# Patient Record
Sex: Male | Born: 1940
Health system: Southern US, Community
[De-identification: ages and names within clinical notes are randomized; demographics above are authoritative.]

## PROBLEM LIST (undated history)

## (undated) DIAGNOSIS — Z8673 Personal history of transient ischemic attack (TIA), and cerebral infarction without residual deficits: Secondary | ICD-10-CM

## (undated) DIAGNOSIS — I672 Cerebral atherosclerosis: Secondary | ICD-10-CM

## (undated) DIAGNOSIS — I519 Heart disease, unspecified: Secondary | ICD-10-CM

## (undated) DIAGNOSIS — I639 Cerebral infarction, unspecified: Secondary | ICD-10-CM

## (undated) DIAGNOSIS — I5020 Unspecified systolic (congestive) heart failure: Secondary | ICD-10-CM

## (undated) DIAGNOSIS — C801 Malignant (primary) neoplasm, unspecified: Secondary | ICD-10-CM

## (undated) DIAGNOSIS — N183 Chronic kidney disease, stage 3 unspecified: Secondary | ICD-10-CM

## (undated) DIAGNOSIS — I6529 Occlusion and stenosis of unspecified carotid artery: Secondary | ICD-10-CM

## (undated) HISTORY — DX: Personal history of transient ischemic attack (TIA), and cerebral infarction without residual deficits: Z86.73

## (undated) HISTORY — PX: BACK SURGERY: SHX140

## (undated) HISTORY — PX: REPLACEMENT TOTAL KNEE: SUR1224

## (undated) HISTORY — DX: Occlusion and stenosis of unspecified carotid artery: I65.29

## (undated) HISTORY — DX: Personal history of transient ischemic attack (TIA), and cerebral infarction without residual deficits: I67.2

## (undated) HISTORY — DX: Unspecified systolic (congestive) heart failure: I50.20

## (undated) HISTORY — DX: Heart disease, unspecified: I51.9

## (undated) HISTORY — DX: Chronic kidney disease, stage 3 unspecified: N18.30

## (undated) HISTORY — PX: SHOULDER SURGERY: SHX246

---

## 2019-02-28 DIAGNOSIS — G8929 Other chronic pain: Secondary | ICD-10-CM

## 2019-02-28 DIAGNOSIS — M961 Postlaminectomy syndrome, not elsewhere classified: Secondary | ICD-10-CM

## 2019-02-28 DIAGNOSIS — Z9889 Other specified postprocedural states: Secondary | ICD-10-CM | POA: Insufficient documentation

## 2019-02-28 DIAGNOSIS — M5136 Other intervertebral disc degeneration, lumbar region: Secondary | ICD-10-CM

## 2019-02-28 DIAGNOSIS — M5116 Intervertebral disc disorders with radiculopathy, lumbar region: Secondary | ICD-10-CM | POA: Diagnosis not present

## 2019-02-28 DIAGNOSIS — Z9682 Presence of neurostimulator: Secondary | ICD-10-CM | POA: Diagnosis not present

## 2019-02-28 DIAGNOSIS — Z9689 Presence of other specified functional implants: Secondary | ICD-10-CM

## 2019-02-28 DIAGNOSIS — Z79891 Long term (current) use of opiate analgesic: Secondary | ICD-10-CM | POA: Diagnosis not present

## 2019-02-28 DIAGNOSIS — M5416 Radiculopathy, lumbar region: Secondary | ICD-10-CM

## 2019-02-28 DIAGNOSIS — M51369 Other intervertebral disc degeneration, lumbar region without mention of lumbar back pain or lower extremity pain: Secondary | ICD-10-CM | POA: Insufficient documentation

## 2019-02-28 DIAGNOSIS — M542 Cervicalgia: Secondary | ICD-10-CM

## 2019-02-28 HISTORY — DX: Radiculopathy, lumbar region: M54.16

## 2019-02-28 HISTORY — DX: Other specified postprocedural states: Z98.890

## 2019-02-28 HISTORY — DX: Other chronic pain: G89.29

## 2019-02-28 HISTORY — DX: Cervicalgia: M54.2

## 2019-02-28 HISTORY — DX: Presence of other specified functional implants: Z96.89

## 2019-02-28 HISTORY — DX: Postlaminectomy syndrome, not elsewhere classified: M96.1

## 2019-02-28 HISTORY — DX: Other intervertebral disc degeneration, lumbar region: M51.36

## 2019-02-28 HISTORY — DX: Other intervertebral disc degeneration, lumbar region without mention of lumbar back pain or lower extremity pain: M51.369

## 2019-03-04 DIAGNOSIS — Z0000 Encounter for general adult medical examination without abnormal findings: Secondary | ICD-10-CM | POA: Diagnosis not present

## 2019-03-04 DIAGNOSIS — Z6833 Body mass index (BMI) 33.0-33.9, adult: Secondary | ICD-10-CM | POA: Diagnosis not present

## 2019-03-04 DIAGNOSIS — E1165 Type 2 diabetes mellitus with hyperglycemia: Secondary | ICD-10-CM | POA: Diagnosis not present

## 2019-03-04 DIAGNOSIS — Z7189 Other specified counseling: Secondary | ICD-10-CM | POA: Diagnosis not present

## 2019-03-04 DIAGNOSIS — Z713 Dietary counseling and surveillance: Secondary | ICD-10-CM | POA: Diagnosis not present

## 2019-03-04 DIAGNOSIS — E1129 Type 2 diabetes mellitus with other diabetic kidney complication: Secondary | ICD-10-CM | POA: Diagnosis not present

## 2019-03-04 DIAGNOSIS — Z1331 Encounter for screening for depression: Secondary | ICD-10-CM | POA: Diagnosis not present

## 2019-03-04 DIAGNOSIS — Z1389 Encounter for screening for other disorder: Secondary | ICD-10-CM | POA: Diagnosis not present

## 2019-03-04 DIAGNOSIS — E785 Hyperlipidemia, unspecified: Secondary | ICD-10-CM | POA: Diagnosis not present

## 2019-03-04 NOTE — Patient Outreach (Signed)
Received MD referral from Dr. Nyra Capes office.  I have sent it to [email or web address] HTA's Care Management per workflow.

## 2019-03-06 NOTE — Patient Outreach (Signed)
Received a referral from Dr. Sheral Apley office, Patient has HTA insurance sent the referral to [facility] through [email or web address] email.

## 2019-03-20 DIAGNOSIS — E1129 Type 2 diabetes mellitus with other diabetic kidney complication: Secondary | ICD-10-CM | POA: Diagnosis not present

## 2019-03-20 DIAGNOSIS — E669 Obesity, unspecified: Secondary | ICD-10-CM | POA: Diagnosis not present

## 2019-03-20 DIAGNOSIS — E1165 Type 2 diabetes mellitus with hyperglycemia: Secondary | ICD-10-CM | POA: Diagnosis not present

## 2019-03-20 DIAGNOSIS — E785 Hyperlipidemia, unspecified: Secondary | ICD-10-CM | POA: Diagnosis not present

## 2019-03-28 DIAGNOSIS — M5116 Intervertebral disc disorders with radiculopathy, lumbar region: Secondary | ICD-10-CM | POA: Diagnosis not present

## 2019-03-28 DIAGNOSIS — M5136 Other intervertebral disc degeneration, lumbar region: Secondary | ICD-10-CM | POA: Diagnosis not present

## 2019-03-28 DIAGNOSIS — Z9889 Other specified postprocedural states: Secondary | ICD-10-CM | POA: Diagnosis not present

## 2019-03-28 DIAGNOSIS — Z760 Encounter for issue of repeat prescription: Secondary | ICD-10-CM | POA: Diagnosis not present

## 2019-03-28 DIAGNOSIS — Z79891 Long term (current) use of opiate analgesic: Secondary | ICD-10-CM | POA: Diagnosis not present

## 2019-03-28 DIAGNOSIS — M542 Cervicalgia: Secondary | ICD-10-CM | POA: Diagnosis not present

## 2019-03-28 DIAGNOSIS — Z9682 Presence of neurostimulator: Secondary | ICD-10-CM | POA: Diagnosis not present

## 2019-03-28 DIAGNOSIS — G8929 Other chronic pain: Secondary | ICD-10-CM | POA: Diagnosis not present

## 2019-04-03 DIAGNOSIS — E1165 Type 2 diabetes mellitus with hyperglycemia: Secondary | ICD-10-CM | POA: Diagnosis not present

## 2019-04-03 DIAGNOSIS — E1129 Type 2 diabetes mellitus with other diabetic kidney complication: Secondary | ICD-10-CM | POA: Diagnosis not present

## 2019-04-03 DIAGNOSIS — Z6831 Body mass index (BMI) 31.0-31.9, adult: Secondary | ICD-10-CM | POA: Diagnosis not present

## 2019-04-19 DIAGNOSIS — E1129 Type 2 diabetes mellitus with other diabetic kidney complication: Secondary | ICD-10-CM | POA: Diagnosis not present

## 2019-04-19 DIAGNOSIS — E1165 Type 2 diabetes mellitus with hyperglycemia: Secondary | ICD-10-CM | POA: Diagnosis not present

## 2019-04-19 DIAGNOSIS — E785 Hyperlipidemia, unspecified: Secondary | ICD-10-CM | POA: Diagnosis not present

## 2019-04-25 NOTE — Patient Outreach (Signed)
Received a referral from Dr.  Nyra Capes Patient has HTA insurance, This referral has been transferred to HTA CM through email address [email or web address] per workflow.

## 2019-05-14 DIAGNOSIS — E1129 Type 2 diabetes mellitus with other diabetic kidney complication: Secondary | ICD-10-CM | POA: Diagnosis not present

## 2019-05-14 DIAGNOSIS — E1165 Type 2 diabetes mellitus with hyperglycemia: Secondary | ICD-10-CM | POA: Diagnosis not present

## 2019-05-20 DIAGNOSIS — E1165 Type 2 diabetes mellitus with hyperglycemia: Secondary | ICD-10-CM | POA: Diagnosis not present

## 2019-05-20 DIAGNOSIS — E785 Hyperlipidemia, unspecified: Secondary | ICD-10-CM | POA: Diagnosis not present

## 2019-05-20 DIAGNOSIS — E1129 Type 2 diabetes mellitus with other diabetic kidney complication: Secondary | ICD-10-CM | POA: Diagnosis not present

## 2019-05-28 DIAGNOSIS — E785 Hyperlipidemia, unspecified: Secondary | ICD-10-CM | POA: Diagnosis not present

## 2019-05-28 DIAGNOSIS — E1129 Type 2 diabetes mellitus with other diabetic kidney complication: Secondary | ICD-10-CM | POA: Diagnosis not present

## 2019-06-03 DIAGNOSIS — E1129 Type 2 diabetes mellitus with other diabetic kidney complication: Secondary | ICD-10-CM | POA: Diagnosis not present

## 2019-06-03 DIAGNOSIS — E785 Hyperlipidemia, unspecified: Secondary | ICD-10-CM | POA: Diagnosis not present

## 2019-06-03 DIAGNOSIS — Z6829 Body mass index (BMI) 29.0-29.9, adult: Secondary | ICD-10-CM | POA: Diagnosis not present

## 2019-06-03 DIAGNOSIS — E1165 Type 2 diabetes mellitus with hyperglycemia: Secondary | ICD-10-CM | POA: Diagnosis not present

## 2019-06-19 DIAGNOSIS — E1129 Type 2 diabetes mellitus with other diabetic kidney complication: Secondary | ICD-10-CM | POA: Diagnosis not present

## 2019-06-19 DIAGNOSIS — E785 Hyperlipidemia, unspecified: Secondary | ICD-10-CM | POA: Diagnosis not present

## 2019-06-19 DIAGNOSIS — E1165 Type 2 diabetes mellitus with hyperglycemia: Secondary | ICD-10-CM | POA: Diagnosis not present

## 2019-06-19 DIAGNOSIS — Z6829 Body mass index (BMI) 29.0-29.9, adult: Secondary | ICD-10-CM | POA: Diagnosis not present

## 2019-06-27 DIAGNOSIS — Z79891 Long term (current) use of opiate analgesic: Secondary | ICD-10-CM | POA: Diagnosis not present

## 2019-06-27 DIAGNOSIS — M542 Cervicalgia: Secondary | ICD-10-CM | POA: Diagnosis not present

## 2019-06-27 DIAGNOSIS — Z760 Encounter for issue of repeat prescription: Secondary | ICD-10-CM | POA: Diagnosis not present

## 2019-06-27 DIAGNOSIS — G8928 Other chronic postprocedural pain: Secondary | ICD-10-CM | POA: Diagnosis not present

## 2019-06-27 DIAGNOSIS — Z0289 Encounter for other administrative examinations: Secondary | ICD-10-CM

## 2019-06-27 DIAGNOSIS — M5116 Intervertebral disc disorders with radiculopathy, lumbar region: Secondary | ICD-10-CM | POA: Diagnosis not present

## 2019-06-27 DIAGNOSIS — Z9889 Other specified postprocedural states: Secondary | ICD-10-CM | POA: Diagnosis not present

## 2019-06-27 DIAGNOSIS — Z9682 Presence of neurostimulator: Secondary | ICD-10-CM | POA: Diagnosis not present

## 2019-06-27 HISTORY — DX: Encounter for other administrative examinations: Z02.89

## 2019-07-12 DIAGNOSIS — E119 Type 2 diabetes mellitus without complications: Secondary | ICD-10-CM | POA: Diagnosis not present

## 2019-07-19 DIAGNOSIS — E785 Hyperlipidemia, unspecified: Secondary | ICD-10-CM | POA: Diagnosis not present

## 2019-07-19 DIAGNOSIS — E1165 Type 2 diabetes mellitus with hyperglycemia: Secondary | ICD-10-CM | POA: Diagnosis not present

## 2019-07-19 DIAGNOSIS — E1129 Type 2 diabetes mellitus with other diabetic kidney complication: Secondary | ICD-10-CM | POA: Diagnosis not present

## 2019-09-02 DIAGNOSIS — E1129 Type 2 diabetes mellitus with other diabetic kidney complication: Secondary | ICD-10-CM | POA: Diagnosis not present

## 2019-09-02 DIAGNOSIS — E785 Hyperlipidemia, unspecified: Secondary | ICD-10-CM | POA: Diagnosis not present

## 2019-09-09 DIAGNOSIS — E1129 Type 2 diabetes mellitus with other diabetic kidney complication: Secondary | ICD-10-CM | POA: Diagnosis not present

## 2019-09-09 DIAGNOSIS — E785 Hyperlipidemia, unspecified: Secondary | ICD-10-CM | POA: Diagnosis not present

## 2019-09-09 DIAGNOSIS — N1831 Chronic kidney disease, stage 3a: Secondary | ICD-10-CM | POA: Diagnosis not present

## 2019-09-09 DIAGNOSIS — E1165 Type 2 diabetes mellitus with hyperglycemia: Secondary | ICD-10-CM | POA: Diagnosis not present

## 2019-09-11 DIAGNOSIS — E1129 Type 2 diabetes mellitus with other diabetic kidney complication: Secondary | ICD-10-CM | POA: Diagnosis not present

## 2019-09-18 DIAGNOSIS — E1165 Type 2 diabetes mellitus with hyperglycemia: Secondary | ICD-10-CM | POA: Diagnosis not present

## 2019-09-18 DIAGNOSIS — E1129 Type 2 diabetes mellitus with other diabetic kidney complication: Secondary | ICD-10-CM | POA: Diagnosis not present

## 2019-09-18 DIAGNOSIS — E785 Hyperlipidemia, unspecified: Secondary | ICD-10-CM | POA: Diagnosis not present

## 2019-09-18 DIAGNOSIS — N1831 Chronic kidney disease, stage 3a: Secondary | ICD-10-CM | POA: Diagnosis not present

## 2019-09-19 DIAGNOSIS — M5416 Radiculopathy, lumbar region: Secondary | ICD-10-CM | POA: Diagnosis not present

## 2019-09-19 DIAGNOSIS — Z9689 Presence of other specified functional implants: Secondary | ICD-10-CM | POA: Diagnosis not present

## 2019-09-19 DIAGNOSIS — Z0289 Encounter for other administrative examinations: Secondary | ICD-10-CM | POA: Diagnosis not present

## 2019-09-19 DIAGNOSIS — M5136 Other intervertebral disc degeneration, lumbar region: Secondary | ICD-10-CM | POA: Diagnosis not present

## 2019-09-19 DIAGNOSIS — Z9889 Other specified postprocedural states: Secondary | ICD-10-CM | POA: Diagnosis not present

## 2019-09-19 DIAGNOSIS — M542 Cervicalgia: Secondary | ICD-10-CM | POA: Diagnosis not present

## 2019-09-25 DIAGNOSIS — N1831 Chronic kidney disease, stage 3a: Secondary | ICD-10-CM | POA: Diagnosis not present

## 2019-10-09 DIAGNOSIS — Z6828 Body mass index (BMI) 28.0-28.9, adult: Secondary | ICD-10-CM | POA: Diagnosis not present

## 2019-10-09 DIAGNOSIS — N1831 Chronic kidney disease, stage 3a: Secondary | ICD-10-CM | POA: Diagnosis not present

## 2019-10-09 DIAGNOSIS — E1129 Type 2 diabetes mellitus with other diabetic kidney complication: Secondary | ICD-10-CM | POA: Diagnosis not present

## 2019-10-09 DIAGNOSIS — E1165 Type 2 diabetes mellitus with hyperglycemia: Secondary | ICD-10-CM | POA: Diagnosis not present

## 2019-10-16 DIAGNOSIS — M25562 Pain in left knee: Secondary | ICD-10-CM | POA: Diagnosis not present

## 2019-10-16 DIAGNOSIS — M1712 Unilateral primary osteoarthritis, left knee: Secondary | ICD-10-CM | POA: Diagnosis not present

## 2019-10-16 DIAGNOSIS — M25762 Osteophyte, left knee: Secondary | ICD-10-CM | POA: Diagnosis not present

## 2019-10-18 DIAGNOSIS — E1129 Type 2 diabetes mellitus with other diabetic kidney complication: Secondary | ICD-10-CM | POA: Diagnosis not present

## 2019-10-18 DIAGNOSIS — Z7189 Other specified counseling: Secondary | ICD-10-CM | POA: Diagnosis not present

## 2019-10-18 DIAGNOSIS — N1831 Chronic kidney disease, stage 3a: Secondary | ICD-10-CM | POA: Diagnosis not present

## 2019-10-18 DIAGNOSIS — E785 Hyperlipidemia, unspecified: Secondary | ICD-10-CM | POA: Diagnosis not present

## 2019-10-18 DIAGNOSIS — E1165 Type 2 diabetes mellitus with hyperglycemia: Secondary | ICD-10-CM | POA: Diagnosis not present

## 2019-10-22 DIAGNOSIS — M542 Cervicalgia: Secondary | ICD-10-CM | POA: Diagnosis not present

## 2019-10-22 DIAGNOSIS — I700 Atherosclerosis of aorta: Secondary | ICD-10-CM | POA: Diagnosis not present

## 2019-10-22 DIAGNOSIS — Z79899 Other long term (current) drug therapy: Secondary | ICD-10-CM | POA: Diagnosis not present

## 2019-10-22 DIAGNOSIS — Z9889 Other specified postprocedural states: Secondary | ICD-10-CM | POA: Diagnosis not present

## 2019-10-22 DIAGNOSIS — Z9682 Presence of neurostimulator: Secondary | ICD-10-CM | POA: Diagnosis not present

## 2019-10-22 DIAGNOSIS — G8929 Other chronic pain: Secondary | ICD-10-CM | POA: Diagnosis not present

## 2019-10-22 DIAGNOSIS — M549 Dorsalgia, unspecified: Secondary | ICD-10-CM | POA: Diagnosis not present

## 2019-10-22 DIAGNOSIS — Z9689 Presence of other specified functional implants: Secondary | ICD-10-CM | POA: Diagnosis not present

## 2019-10-22 DIAGNOSIS — M5416 Radiculopathy, lumbar region: Secondary | ICD-10-CM | POA: Diagnosis not present

## 2019-10-22 DIAGNOSIS — M47816 Spondylosis without myelopathy or radiculopathy, lumbar region: Secondary | ICD-10-CM | POA: Diagnosis not present

## 2019-10-23 DIAGNOSIS — E1129 Type 2 diabetes mellitus with other diabetic kidney complication: Secondary | ICD-10-CM | POA: Diagnosis not present

## 2019-10-23 DIAGNOSIS — E1165 Type 2 diabetes mellitus with hyperglycemia: Secondary | ICD-10-CM | POA: Diagnosis not present

## 2019-10-23 DIAGNOSIS — E785 Hyperlipidemia, unspecified: Secondary | ICD-10-CM | POA: Diagnosis not present

## 2019-10-23 DIAGNOSIS — N1831 Chronic kidney disease, stage 3a: Secondary | ICD-10-CM | POA: Diagnosis not present

## 2019-11-18 DIAGNOSIS — E785 Hyperlipidemia, unspecified: Secondary | ICD-10-CM | POA: Diagnosis not present

## 2019-11-18 DIAGNOSIS — E1129 Type 2 diabetes mellitus with other diabetic kidney complication: Secondary | ICD-10-CM | POA: Diagnosis not present

## 2019-11-18 DIAGNOSIS — E1165 Type 2 diabetes mellitus with hyperglycemia: Secondary | ICD-10-CM | POA: Diagnosis not present

## 2019-11-18 DIAGNOSIS — N1831 Chronic kidney disease, stage 3a: Secondary | ICD-10-CM | POA: Diagnosis not present

## 2019-11-26 DIAGNOSIS — H00019 Hordeolum externum unspecified eye, unspecified eyelid: Secondary | ICD-10-CM | POA: Diagnosis not present

## 2019-11-26 DIAGNOSIS — Z6828 Body mass index (BMI) 28.0-28.9, adult: Secondary | ICD-10-CM | POA: Diagnosis not present

## 2019-12-17 DIAGNOSIS — M5116 Intervertebral disc disorders with radiculopathy, lumbar region: Secondary | ICD-10-CM | POA: Diagnosis not present

## 2019-12-17 DIAGNOSIS — M542 Cervicalgia: Secondary | ICD-10-CM | POA: Diagnosis not present

## 2019-12-17 DIAGNOSIS — Z9889 Other specified postprocedural states: Secondary | ICD-10-CM | POA: Diagnosis not present

## 2019-12-17 DIAGNOSIS — G8929 Other chronic pain: Secondary | ICD-10-CM | POA: Diagnosis not present

## 2019-12-17 DIAGNOSIS — M5136 Other intervertebral disc degeneration, lumbar region: Secondary | ICD-10-CM | POA: Diagnosis not present

## 2019-12-17 DIAGNOSIS — Z9682 Presence of neurostimulator: Secondary | ICD-10-CM | POA: Diagnosis not present

## 2019-12-17 DIAGNOSIS — Z79899 Other long term (current) drug therapy: Secondary | ICD-10-CM | POA: Diagnosis not present

## 2019-12-17 DIAGNOSIS — Z760 Encounter for issue of repeat prescription: Secondary | ICD-10-CM | POA: Diagnosis not present

## 2019-12-17 DIAGNOSIS — Z79891 Long term (current) use of opiate analgesic: Secondary | ICD-10-CM | POA: Diagnosis not present

## 2019-12-18 DIAGNOSIS — N1831 Chronic kidney disease, stage 3a: Secondary | ICD-10-CM | POA: Diagnosis not present

## 2019-12-18 DIAGNOSIS — E785 Hyperlipidemia, unspecified: Secondary | ICD-10-CM | POA: Diagnosis not present

## 2019-12-18 DIAGNOSIS — E1129 Type 2 diabetes mellitus with other diabetic kidney complication: Secondary | ICD-10-CM | POA: Diagnosis not present

## 2019-12-18 DIAGNOSIS — E1165 Type 2 diabetes mellitus with hyperglycemia: Secondary | ICD-10-CM | POA: Diagnosis not present

## 2019-12-20 DIAGNOSIS — C44729 Squamous cell carcinoma of skin of left lower limb, including hip: Secondary | ICD-10-CM | POA: Diagnosis not present

## 2019-12-20 DIAGNOSIS — L578 Other skin changes due to chronic exposure to nonionizing radiation: Secondary | ICD-10-CM | POA: Diagnosis not present

## 2019-12-20 DIAGNOSIS — L814 Other melanin hyperpigmentation: Secondary | ICD-10-CM | POA: Diagnosis not present

## 2019-12-20 DIAGNOSIS — C44722 Squamous cell carcinoma of skin of right lower limb, including hip: Secondary | ICD-10-CM | POA: Diagnosis not present

## 2020-01-06 DIAGNOSIS — E785 Hyperlipidemia, unspecified: Secondary | ICD-10-CM | POA: Diagnosis not present

## 2020-01-06 DIAGNOSIS — E1129 Type 2 diabetes mellitus with other diabetic kidney complication: Secondary | ICD-10-CM | POA: Diagnosis not present

## 2020-01-13 DIAGNOSIS — Z0000 Encounter for general adult medical examination without abnormal findings: Secondary | ICD-10-CM | POA: Diagnosis not present

## 2020-01-13 DIAGNOSIS — E1129 Type 2 diabetes mellitus with other diabetic kidney complication: Secondary | ICD-10-CM | POA: Diagnosis not present

## 2020-01-13 DIAGNOSIS — Z1339 Encounter for screening examination for other mental health and behavioral disorders: Secondary | ICD-10-CM | POA: Diagnosis not present

## 2020-01-13 DIAGNOSIS — Z136 Encounter for screening for cardiovascular disorders: Secondary | ICD-10-CM | POA: Diagnosis not present

## 2020-01-13 DIAGNOSIS — Z139 Encounter for screening, unspecified: Secondary | ICD-10-CM | POA: Diagnosis not present

## 2020-01-13 DIAGNOSIS — E1165 Type 2 diabetes mellitus with hyperglycemia: Secondary | ICD-10-CM | POA: Diagnosis not present

## 2020-01-13 DIAGNOSIS — Z1331 Encounter for screening for depression: Secondary | ICD-10-CM | POA: Diagnosis not present

## 2020-01-13 DIAGNOSIS — E663 Overweight: Secondary | ICD-10-CM | POA: Diagnosis not present

## 2020-01-13 DIAGNOSIS — E785 Hyperlipidemia, unspecified: Secondary | ICD-10-CM | POA: Diagnosis not present

## 2020-01-13 DIAGNOSIS — I499 Cardiac arrhythmia, unspecified: Secondary | ICD-10-CM | POA: Diagnosis not present

## 2020-01-19 DIAGNOSIS — E785 Hyperlipidemia, unspecified: Secondary | ICD-10-CM | POA: Diagnosis not present

## 2020-01-19 DIAGNOSIS — E1129 Type 2 diabetes mellitus with other diabetic kidney complication: Secondary | ICD-10-CM | POA: Diagnosis not present

## 2020-01-19 DIAGNOSIS — I499 Cardiac arrhythmia, unspecified: Secondary | ICD-10-CM | POA: Diagnosis not present

## 2020-01-19 DIAGNOSIS — E1165 Type 2 diabetes mellitus with hyperglycemia: Secondary | ICD-10-CM | POA: Diagnosis not present

## 2020-01-23 DIAGNOSIS — C44729 Squamous cell carcinoma of skin of left lower limb, including hip: Secondary | ICD-10-CM | POA: Diagnosis not present

## 2020-01-27 DIAGNOSIS — C44722 Squamous cell carcinoma of skin of right lower limb, including hip: Secondary | ICD-10-CM | POA: Diagnosis not present

## 2020-02-18 DIAGNOSIS — I499 Cardiac arrhythmia, unspecified: Secondary | ICD-10-CM | POA: Diagnosis not present

## 2020-02-18 DIAGNOSIS — E1129 Type 2 diabetes mellitus with other diabetic kidney complication: Secondary | ICD-10-CM | POA: Diagnosis not present

## 2020-02-18 DIAGNOSIS — E1165 Type 2 diabetes mellitus with hyperglycemia: Secondary | ICD-10-CM | POA: Diagnosis not present

## 2020-02-18 DIAGNOSIS — E785 Hyperlipidemia, unspecified: Secondary | ICD-10-CM | POA: Diagnosis not present

## 2020-03-03 DIAGNOSIS — Z79891 Long term (current) use of opiate analgesic: Secondary | ICD-10-CM | POA: Diagnosis not present

## 2020-03-03 DIAGNOSIS — M4726 Other spondylosis with radiculopathy, lumbar region: Secondary | ICD-10-CM | POA: Diagnosis not present

## 2020-03-03 DIAGNOSIS — G8929 Other chronic pain: Secondary | ICD-10-CM | POA: Diagnosis not present

## 2020-03-03 DIAGNOSIS — Z760 Encounter for issue of repeat prescription: Secondary | ICD-10-CM | POA: Diagnosis not present

## 2020-03-03 DIAGNOSIS — Z9889 Other specified postprocedural states: Secondary | ICD-10-CM | POA: Diagnosis not present

## 2020-03-03 DIAGNOSIS — Z79899 Other long term (current) drug therapy: Secondary | ICD-10-CM | POA: Diagnosis not present

## 2020-03-03 DIAGNOSIS — Z9682 Presence of neurostimulator: Secondary | ICD-10-CM | POA: Diagnosis not present

## 2020-03-05 DIAGNOSIS — L570 Actinic keratosis: Secondary | ICD-10-CM | POA: Diagnosis not present

## 2020-03-20 DIAGNOSIS — E1165 Type 2 diabetes mellitus with hyperglycemia: Secondary | ICD-10-CM | POA: Diagnosis not present

## 2020-03-20 DIAGNOSIS — I499 Cardiac arrhythmia, unspecified: Secondary | ICD-10-CM | POA: Diagnosis not present

## 2020-03-20 DIAGNOSIS — E785 Hyperlipidemia, unspecified: Secondary | ICD-10-CM | POA: Diagnosis not present

## 2020-03-20 DIAGNOSIS — E1129 Type 2 diabetes mellitus with other diabetic kidney complication: Secondary | ICD-10-CM | POA: Diagnosis not present

## 2020-04-02 DIAGNOSIS — L570 Actinic keratosis: Secondary | ICD-10-CM | POA: Diagnosis not present

## 2020-04-02 DIAGNOSIS — C44722 Squamous cell carcinoma of skin of right lower limb, including hip: Secondary | ICD-10-CM | POA: Diagnosis not present

## 2020-04-02 DIAGNOSIS — L578 Other skin changes due to chronic exposure to nonionizing radiation: Secondary | ICD-10-CM | POA: Diagnosis not present

## 2020-04-02 DIAGNOSIS — C44729 Squamous cell carcinoma of skin of left lower limb, including hip: Secondary | ICD-10-CM | POA: Diagnosis not present

## 2020-04-07 DIAGNOSIS — E1129 Type 2 diabetes mellitus with other diabetic kidney complication: Secondary | ICD-10-CM | POA: Diagnosis not present

## 2020-04-07 DIAGNOSIS — E785 Hyperlipidemia, unspecified: Secondary | ICD-10-CM | POA: Diagnosis not present

## 2020-04-15 DIAGNOSIS — E785 Hyperlipidemia, unspecified: Secondary | ICD-10-CM | POA: Diagnosis not present

## 2020-04-15 DIAGNOSIS — E1165 Type 2 diabetes mellitus with hyperglycemia: Secondary | ICD-10-CM | POA: Diagnosis not present

## 2020-04-15 DIAGNOSIS — E1129 Type 2 diabetes mellitus with other diabetic kidney complication: Secondary | ICD-10-CM | POA: Diagnosis not present

## 2020-04-15 DIAGNOSIS — N1831 Chronic kidney disease, stage 3a: Secondary | ICD-10-CM | POA: Diagnosis not present

## 2020-04-20 DIAGNOSIS — E1129 Type 2 diabetes mellitus with other diabetic kidney complication: Secondary | ICD-10-CM | POA: Diagnosis not present

## 2020-04-20 DIAGNOSIS — E1165 Type 2 diabetes mellitus with hyperglycemia: Secondary | ICD-10-CM | POA: Diagnosis not present

## 2020-04-20 DIAGNOSIS — E785 Hyperlipidemia, unspecified: Secondary | ICD-10-CM | POA: Diagnosis not present

## 2020-04-20 DIAGNOSIS — N1831 Chronic kidney disease, stage 3a: Secondary | ICD-10-CM | POA: Diagnosis not present

## 2020-05-20 DIAGNOSIS — M50323 Other cervical disc degeneration at C6-C7 level: Secondary | ICD-10-CM | POA: Diagnosis not present

## 2020-05-20 DIAGNOSIS — E1129 Type 2 diabetes mellitus with other diabetic kidney complication: Secondary | ICD-10-CM | POA: Diagnosis not present

## 2020-05-20 DIAGNOSIS — M5413 Radiculopathy, cervicothoracic region: Secondary | ICD-10-CM | POA: Diagnosis not present

## 2020-05-20 DIAGNOSIS — N1831 Chronic kidney disease, stage 3a: Secondary | ICD-10-CM | POA: Diagnosis not present

## 2020-05-20 DIAGNOSIS — M9901 Segmental and somatic dysfunction of cervical region: Secondary | ICD-10-CM | POA: Diagnosis not present

## 2020-05-20 DIAGNOSIS — M9902 Segmental and somatic dysfunction of thoracic region: Secondary | ICD-10-CM | POA: Diagnosis not present

## 2020-05-20 DIAGNOSIS — M546 Pain in thoracic spine: Secondary | ICD-10-CM | POA: Diagnosis not present

## 2020-05-20 DIAGNOSIS — E785 Hyperlipidemia, unspecified: Secondary | ICD-10-CM | POA: Diagnosis not present

## 2020-05-20 DIAGNOSIS — E1165 Type 2 diabetes mellitus with hyperglycemia: Secondary | ICD-10-CM | POA: Diagnosis not present

## 2020-05-21 DIAGNOSIS — M546 Pain in thoracic spine: Secondary | ICD-10-CM | POA: Diagnosis not present

## 2020-05-21 DIAGNOSIS — M5413 Radiculopathy, cervicothoracic region: Secondary | ICD-10-CM | POA: Diagnosis not present

## 2020-05-21 DIAGNOSIS — M9901 Segmental and somatic dysfunction of cervical region: Secondary | ICD-10-CM | POA: Diagnosis not present

## 2020-05-21 DIAGNOSIS — M50323 Other cervical disc degeneration at C6-C7 level: Secondary | ICD-10-CM | POA: Diagnosis not present

## 2020-05-21 DIAGNOSIS — M9902 Segmental and somatic dysfunction of thoracic region: Secondary | ICD-10-CM | POA: Diagnosis not present

## 2020-05-25 DIAGNOSIS — M9902 Segmental and somatic dysfunction of thoracic region: Secondary | ICD-10-CM | POA: Diagnosis not present

## 2020-05-25 DIAGNOSIS — M546 Pain in thoracic spine: Secondary | ICD-10-CM | POA: Diagnosis not present

## 2020-05-25 DIAGNOSIS — M9901 Segmental and somatic dysfunction of cervical region: Secondary | ICD-10-CM | POA: Diagnosis not present

## 2020-05-25 DIAGNOSIS — M5413 Radiculopathy, cervicothoracic region: Secondary | ICD-10-CM | POA: Diagnosis not present

## 2020-05-25 DIAGNOSIS — M50323 Other cervical disc degeneration at C6-C7 level: Secondary | ICD-10-CM | POA: Diagnosis not present

## 2020-05-26 DIAGNOSIS — Z79899 Other long term (current) drug therapy: Secondary | ICD-10-CM | POA: Diagnosis not present

## 2020-05-26 DIAGNOSIS — Z9889 Other specified postprocedural states: Secondary | ICD-10-CM | POA: Diagnosis not present

## 2020-05-26 DIAGNOSIS — M542 Cervicalgia: Secondary | ICD-10-CM | POA: Diagnosis not present

## 2020-05-26 DIAGNOSIS — M546 Pain in thoracic spine: Secondary | ICD-10-CM | POA: Diagnosis not present

## 2020-05-26 DIAGNOSIS — M5413 Radiculopathy, cervicothoracic region: Secondary | ICD-10-CM | POA: Diagnosis not present

## 2020-05-26 DIAGNOSIS — Z79891 Long term (current) use of opiate analgesic: Secondary | ICD-10-CM | POA: Diagnosis not present

## 2020-05-26 DIAGNOSIS — M25512 Pain in left shoulder: Secondary | ICD-10-CM | POA: Diagnosis not present

## 2020-05-26 DIAGNOSIS — M9901 Segmental and somatic dysfunction of cervical region: Secondary | ICD-10-CM | POA: Diagnosis not present

## 2020-05-26 DIAGNOSIS — M5116 Intervertebral disc disorders with radiculopathy, lumbar region: Secondary | ICD-10-CM | POA: Diagnosis not present

## 2020-05-26 DIAGNOSIS — M9902 Segmental and somatic dysfunction of thoracic region: Secondary | ICD-10-CM | POA: Diagnosis not present

## 2020-05-26 DIAGNOSIS — G8929 Other chronic pain: Secondary | ICD-10-CM | POA: Diagnosis not present

## 2020-05-26 DIAGNOSIS — Z760 Encounter for issue of repeat prescription: Secondary | ICD-10-CM | POA: Diagnosis not present

## 2020-05-26 DIAGNOSIS — M50323 Other cervical disc degeneration at C6-C7 level: Secondary | ICD-10-CM | POA: Diagnosis not present

## 2020-05-29 DIAGNOSIS — M25712 Osteophyte, left shoulder: Secondary | ICD-10-CM | POA: Diagnosis not present

## 2020-05-29 DIAGNOSIS — M25512 Pain in left shoulder: Secondary | ICD-10-CM | POA: Diagnosis not present

## 2020-05-29 DIAGNOSIS — M542 Cervicalgia: Secondary | ICD-10-CM | POA: Diagnosis not present

## 2020-05-29 DIAGNOSIS — M19012 Primary osteoarthritis, left shoulder: Secondary | ICD-10-CM | POA: Diagnosis not present

## 2020-06-01 DIAGNOSIS — M5413 Radiculopathy, cervicothoracic region: Secondary | ICD-10-CM | POA: Diagnosis not present

## 2020-06-01 DIAGNOSIS — M9901 Segmental and somatic dysfunction of cervical region: Secondary | ICD-10-CM | POA: Diagnosis not present

## 2020-06-01 DIAGNOSIS — M9902 Segmental and somatic dysfunction of thoracic region: Secondary | ICD-10-CM | POA: Diagnosis not present

## 2020-06-01 DIAGNOSIS — M546 Pain in thoracic spine: Secondary | ICD-10-CM | POA: Diagnosis not present

## 2020-06-01 DIAGNOSIS — M50323 Other cervical disc degeneration at C6-C7 level: Secondary | ICD-10-CM | POA: Diagnosis not present

## 2020-06-02 DIAGNOSIS — M5413 Radiculopathy, cervicothoracic region: Secondary | ICD-10-CM | POA: Diagnosis not present

## 2020-06-02 DIAGNOSIS — M50323 Other cervical disc degeneration at C6-C7 level: Secondary | ICD-10-CM | POA: Diagnosis not present

## 2020-06-02 DIAGNOSIS — M9901 Segmental and somatic dysfunction of cervical region: Secondary | ICD-10-CM | POA: Diagnosis not present

## 2020-06-02 DIAGNOSIS — M9902 Segmental and somatic dysfunction of thoracic region: Secondary | ICD-10-CM | POA: Diagnosis not present

## 2020-06-02 DIAGNOSIS — M546 Pain in thoracic spine: Secondary | ICD-10-CM | POA: Diagnosis not present

## 2020-06-04 DIAGNOSIS — L570 Actinic keratosis: Secondary | ICD-10-CM | POA: Diagnosis not present

## 2020-06-04 DIAGNOSIS — D225 Melanocytic nevi of trunk: Secondary | ICD-10-CM | POA: Diagnosis not present

## 2020-06-04 DIAGNOSIS — M9901 Segmental and somatic dysfunction of cervical region: Secondary | ICD-10-CM | POA: Diagnosis not present

## 2020-06-04 DIAGNOSIS — L821 Other seborrheic keratosis: Secondary | ICD-10-CM | POA: Diagnosis not present

## 2020-06-04 DIAGNOSIS — M50323 Other cervical disc degeneration at C6-C7 level: Secondary | ICD-10-CM | POA: Diagnosis not present

## 2020-06-04 DIAGNOSIS — M9902 Segmental and somatic dysfunction of thoracic region: Secondary | ICD-10-CM | POA: Diagnosis not present

## 2020-06-04 DIAGNOSIS — L814 Other melanin hyperpigmentation: Secondary | ICD-10-CM | POA: Diagnosis not present

## 2020-06-04 DIAGNOSIS — M546 Pain in thoracic spine: Secondary | ICD-10-CM | POA: Diagnosis not present

## 2020-06-04 DIAGNOSIS — M5413 Radiculopathy, cervicothoracic region: Secondary | ICD-10-CM | POA: Diagnosis not present

## 2020-06-08 DIAGNOSIS — M9902 Segmental and somatic dysfunction of thoracic region: Secondary | ICD-10-CM | POA: Diagnosis not present

## 2020-06-08 DIAGNOSIS — M546 Pain in thoracic spine: Secondary | ICD-10-CM | POA: Diagnosis not present

## 2020-06-08 DIAGNOSIS — M5413 Radiculopathy, cervicothoracic region: Secondary | ICD-10-CM | POA: Diagnosis not present

## 2020-06-08 DIAGNOSIS — M9901 Segmental and somatic dysfunction of cervical region: Secondary | ICD-10-CM | POA: Diagnosis not present

## 2020-06-08 DIAGNOSIS — M50323 Other cervical disc degeneration at C6-C7 level: Secondary | ICD-10-CM | POA: Diagnosis not present

## 2020-06-09 DIAGNOSIS — M9901 Segmental and somatic dysfunction of cervical region: Secondary | ICD-10-CM | POA: Diagnosis not present

## 2020-06-09 DIAGNOSIS — M50323 Other cervical disc degeneration at C6-C7 level: Secondary | ICD-10-CM | POA: Diagnosis not present

## 2020-06-09 DIAGNOSIS — M546 Pain in thoracic spine: Secondary | ICD-10-CM | POA: Diagnosis not present

## 2020-06-09 DIAGNOSIS — M5413 Radiculopathy, cervicothoracic region: Secondary | ICD-10-CM | POA: Diagnosis not present

## 2020-06-09 DIAGNOSIS — M9902 Segmental and somatic dysfunction of thoracic region: Secondary | ICD-10-CM | POA: Diagnosis not present

## 2020-06-17 DIAGNOSIS — M19012 Primary osteoarthritis, left shoulder: Secondary | ICD-10-CM | POA: Diagnosis not present

## 2020-06-17 DIAGNOSIS — G8929 Other chronic pain: Secondary | ICD-10-CM | POA: Diagnosis not present

## 2020-06-17 DIAGNOSIS — M25512 Pain in left shoulder: Secondary | ICD-10-CM | POA: Diagnosis not present

## 2020-06-20 DIAGNOSIS — E1129 Type 2 diabetes mellitus with other diabetic kidney complication: Secondary | ICD-10-CM | POA: Diagnosis not present

## 2020-06-20 DIAGNOSIS — N1831 Chronic kidney disease, stage 3a: Secondary | ICD-10-CM | POA: Diagnosis not present

## 2020-06-20 DIAGNOSIS — E1165 Type 2 diabetes mellitus with hyperglycemia: Secondary | ICD-10-CM | POA: Diagnosis not present

## 2020-06-20 DIAGNOSIS — E785 Hyperlipidemia, unspecified: Secondary | ICD-10-CM | POA: Diagnosis not present

## 2020-06-24 DIAGNOSIS — G8929 Other chronic pain: Secondary | ICD-10-CM | POA: Diagnosis not present

## 2020-06-24 DIAGNOSIS — Z6831 Body mass index (BMI) 31.0-31.9, adult: Secondary | ICD-10-CM | POA: Diagnosis not present

## 2020-06-24 DIAGNOSIS — M25512 Pain in left shoulder: Secondary | ICD-10-CM | POA: Diagnosis not present

## 2020-07-02 DIAGNOSIS — L578 Other skin changes due to chronic exposure to nonionizing radiation: Secondary | ICD-10-CM | POA: Diagnosis not present

## 2020-07-02 DIAGNOSIS — L821 Other seborrheic keratosis: Secondary | ICD-10-CM | POA: Diagnosis not present

## 2020-07-14 DIAGNOSIS — Z9889 Other specified postprocedural states: Secondary | ICD-10-CM | POA: Diagnosis not present

## 2020-07-14 DIAGNOSIS — M19012 Primary osteoarthritis, left shoulder: Secondary | ICD-10-CM | POA: Diagnosis not present

## 2020-07-14 DIAGNOSIS — M25512 Pain in left shoulder: Secondary | ICD-10-CM | POA: Diagnosis not present

## 2020-07-21 DIAGNOSIS — E785 Hyperlipidemia, unspecified: Secondary | ICD-10-CM | POA: Diagnosis not present

## 2020-07-21 DIAGNOSIS — E1129 Type 2 diabetes mellitus with other diabetic kidney complication: Secondary | ICD-10-CM | POA: Diagnosis not present

## 2020-07-21 DIAGNOSIS — N1831 Chronic kidney disease, stage 3a: Secondary | ICD-10-CM | POA: Diagnosis not present

## 2020-07-21 DIAGNOSIS — E1165 Type 2 diabetes mellitus with hyperglycemia: Secondary | ICD-10-CM | POA: Diagnosis not present

## 2020-08-01 DIAGNOSIS — I110 Hypertensive heart disease with heart failure: Secondary | ICD-10-CM | POA: Diagnosis not present

## 2020-08-01 DIAGNOSIS — I440 Atrioventricular block, first degree: Secondary | ICD-10-CM | POA: Diagnosis not present

## 2020-08-01 DIAGNOSIS — I447 Left bundle-branch block, unspecified: Secondary | ICD-10-CM | POA: Diagnosis not present

## 2020-08-01 DIAGNOSIS — E1121 Type 2 diabetes mellitus with diabetic nephropathy: Secondary | ICD-10-CM | POA: Diagnosis not present

## 2020-08-01 DIAGNOSIS — E861 Hypovolemia: Secondary | ICD-10-CM | POA: Diagnosis not present

## 2020-08-01 DIAGNOSIS — I6389 Other cerebral infarction: Secondary | ICD-10-CM | POA: Diagnosis not present

## 2020-08-01 DIAGNOSIS — J9811 Atelectasis: Secondary | ICD-10-CM | POA: Diagnosis not present

## 2020-08-01 DIAGNOSIS — E1165 Type 2 diabetes mellitus with hyperglycemia: Secondary | ICD-10-CM | POA: Diagnosis not present

## 2020-08-01 DIAGNOSIS — R944 Abnormal results of kidney function studies: Secondary | ICD-10-CM | POA: Diagnosis not present

## 2020-08-01 DIAGNOSIS — M50323 Other cervical disc degeneration at C6-C7 level: Secondary | ICD-10-CM | POA: Diagnosis not present

## 2020-08-01 DIAGNOSIS — I5022 Chronic systolic (congestive) heart failure: Secondary | ICD-10-CM | POA: Diagnosis not present

## 2020-08-01 DIAGNOSIS — N179 Acute kidney failure, unspecified: Secondary | ICD-10-CM | POA: Diagnosis not present

## 2020-08-01 DIAGNOSIS — S199XXA Unspecified injury of neck, initial encounter: Secondary | ICD-10-CM | POA: Diagnosis not present

## 2020-08-01 DIAGNOSIS — I639 Cerebral infarction, unspecified: Secondary | ICD-10-CM | POA: Diagnosis not present

## 2020-08-01 DIAGNOSIS — E785 Hyperlipidemia, unspecified: Secondary | ICD-10-CM | POA: Diagnosis not present

## 2020-08-01 DIAGNOSIS — R519 Headache, unspecified: Secondary | ICD-10-CM | POA: Diagnosis not present

## 2020-08-01 DIAGNOSIS — Z7982 Long term (current) use of aspirin: Secondary | ICD-10-CM | POA: Diagnosis not present

## 2020-08-01 DIAGNOSIS — I6521 Occlusion and stenosis of right carotid artery: Secondary | ICD-10-CM | POA: Diagnosis not present

## 2020-08-01 DIAGNOSIS — Z79899 Other long term (current) drug therapy: Secondary | ICD-10-CM | POA: Diagnosis not present

## 2020-08-01 DIAGNOSIS — E871 Hypo-osmolality and hyponatremia: Secondary | ICD-10-CM | POA: Diagnosis not present

## 2020-08-01 DIAGNOSIS — E860 Dehydration: Secondary | ICD-10-CM | POA: Diagnosis not present

## 2020-08-01 DIAGNOSIS — I672 Cerebral atherosclerosis: Secondary | ICD-10-CM | POA: Diagnosis not present

## 2020-08-01 DIAGNOSIS — Z794 Long term (current) use of insulin: Secondary | ICD-10-CM | POA: Diagnosis not present

## 2020-08-01 DIAGNOSIS — Z87891 Personal history of nicotine dependence: Secondary | ICD-10-CM | POA: Diagnosis not present

## 2020-08-01 DIAGNOSIS — R29700 NIHSS score 0: Secondary | ICD-10-CM | POA: Diagnosis not present

## 2020-08-01 DIAGNOSIS — I6621 Occlusion and stenosis of right posterior cerebral artery: Secondary | ICD-10-CM | POA: Diagnosis not present

## 2020-08-01 DIAGNOSIS — I6503 Occlusion and stenosis of bilateral vertebral arteries: Secondary | ICD-10-CM | POA: Diagnosis not present

## 2020-08-07 DIAGNOSIS — I672 Cerebral atherosclerosis: Secondary | ICD-10-CM | POA: Diagnosis not present

## 2020-08-07 DIAGNOSIS — E1129 Type 2 diabetes mellitus with other diabetic kidney complication: Secondary | ICD-10-CM | POA: Diagnosis not present

## 2020-08-07 DIAGNOSIS — I6529 Occlusion and stenosis of unspecified carotid artery: Secondary | ICD-10-CM | POA: Diagnosis not present

## 2020-08-07 DIAGNOSIS — I5020 Unspecified systolic (congestive) heart failure: Secondary | ICD-10-CM | POA: Diagnosis not present

## 2020-08-07 DIAGNOSIS — Z8673 Personal history of transient ischemic attack (TIA), and cerebral infarction without residual deficits: Secondary | ICD-10-CM | POA: Diagnosis not present

## 2020-08-11 ENCOUNTER — Encounter: Payer: Self-pay | Admitting: *Deleted

## 2020-08-11 ENCOUNTER — Encounter: Payer: Self-pay | Admitting: Cardiology

## 2020-08-11 DIAGNOSIS — I6529 Occlusion and stenosis of unspecified carotid artery: Secondary | ICD-10-CM | POA: Insufficient documentation

## 2020-08-11 DIAGNOSIS — N183 Chronic kidney disease, stage 3 unspecified: Secondary | ICD-10-CM | POA: Insufficient documentation

## 2020-08-11 DIAGNOSIS — I5020 Unspecified systolic (congestive) heart failure: Secondary | ICD-10-CM | POA: Insufficient documentation

## 2020-08-11 DIAGNOSIS — I672 Cerebral atherosclerosis: Secondary | ICD-10-CM | POA: Insufficient documentation

## 2020-08-12 ENCOUNTER — Ambulatory Visit (INDEPENDENT_AMBULATORY_CARE_PROVIDER_SITE_OTHER): Payer: HMO | Admitting: Cardiology

## 2020-08-12 ENCOUNTER — Other Ambulatory Visit: Payer: Self-pay

## 2020-08-12 ENCOUNTER — Encounter: Payer: Self-pay | Admitting: Cardiology

## 2020-08-12 VITALS — BP 126/68 | HR 94 | Ht 73.0 in | Wt 230.2 lb

## 2020-08-12 DIAGNOSIS — E782 Mixed hyperlipidemia: Secondary | ICD-10-CM | POA: Diagnosis not present

## 2020-08-12 DIAGNOSIS — R9389 Abnormal findings on diagnostic imaging of other specified body structures: Secondary | ICD-10-CM | POA: Diagnosis not present

## 2020-08-12 DIAGNOSIS — I10 Essential (primary) hypertension: Secondary | ICD-10-CM

## 2020-08-12 DIAGNOSIS — I447 Left bundle-branch block, unspecified: Secondary | ICD-10-CM | POA: Diagnosis not present

## 2020-08-12 DIAGNOSIS — Z794 Long term (current) use of insulin: Secondary | ICD-10-CM

## 2020-08-12 DIAGNOSIS — I5020 Unspecified systolic (congestive) heart failure: Secondary | ICD-10-CM

## 2020-08-12 DIAGNOSIS — E118 Type 2 diabetes mellitus with unspecified complications: Secondary | ICD-10-CM

## 2020-08-12 DIAGNOSIS — E785 Hyperlipidemia, unspecified: Secondary | ICD-10-CM | POA: Diagnosis not present

## 2020-08-12 DIAGNOSIS — R931 Abnormal findings on diagnostic imaging of heart and coronary circulation: Secondary | ICD-10-CM | POA: Diagnosis not present

## 2020-08-12 DIAGNOSIS — Z8673 Personal history of transient ischemic attack (TIA), and cerebral infarction without residual deficits: Secondary | ICD-10-CM | POA: Diagnosis not present

## 2020-08-12 DIAGNOSIS — E1129 Type 2 diabetes mellitus with other diabetic kidney complication: Secondary | ICD-10-CM | POA: Diagnosis not present

## 2020-08-12 DIAGNOSIS — I4891 Unspecified atrial fibrillation: Secondary | ICD-10-CM | POA: Diagnosis not present

## 2020-08-12 MED ORDER — APIXABAN 5 MG PO TABS: 5.0000 mg | ORAL_TABLET | Freq: Two times a day (BID) | ORAL | 3 refills | Status: DC

## 2020-08-12 NOTE — Patient Instructions (Signed)
Medication Instructions:  Your physician has recommended you make the following change in your medication:  START: Eliquis 5 mg after twice day (After Heart Cath.)    *If you need a refill on your cardiac medications before your next appointment, please call your pharmacy*   Lab Work: Your physician recommends that you return for lab work: TODAY: BMET, Mag, CBC  If you have labs (blood work) drawn today and your tests are completely normal, you will receive your results only by: Marland Kitchen MyChart Message (if you have MyChart) OR . A paper copy in the mail If you have any lab test that is abnormal or we need to change your treatment, we will call you to review the results.   Testing/Procedures:    [place] [address] Dept: [phone] Loc: [phone]  Lavance Beazer  08/12/2020  You are scheduled for a Cardiac Catheterization on Tuesday, March 1 with Dr. [name].  1. Please arrive at the [facility] (Main Entrance A) at [facility]: [address] [place], [place] [address] at 10:00 AM (This time is two hours before your procedure to ensure your preparation). Free valet parking service is available.   Special note: Every effort is made to have your procedure done on time. Please understand that emergencies sometimes delay scheduled procedures.  2. Diet: Do not eat solid foods after midnight.  The patient may have clear liquids until 5am upon the day of the procedure.  3. Labs: You will need to have blood drawn on TODAY.  4. Medication instructions in preparation for your procedure:   Contrast Allergy: No  Do not take Diabetes Med Glucophage (Metformin) on the day of the procedure and HOLD 48 HOURS AFTER THE PROCEDURE.  On the morning of your procedure, take your Aspirin and any morning medicines NOT listed above.  You may use sips of water.  5. Plan for  one night stay--bring personal belongings. 6. Bring a current list of your medications and current insurance cards. 7. You MUST have a responsible person to drive you home. 8. Someone MUST be with you the first 24 hours after you arrive home or your discharge will be delayed. 9. Please wear clothes that are easy to get on and off and wear slip-on shoes.  Thank you for allowing [place] to care for you!   -- [place] [facility]    Follow-Up: At [facility], you and your health needs are our priority.  As part of our continuing mission to provide you with exceptional heart care, we have created designated Provider Care Teams.  These Care Teams include your primary Cardiologist (physician) and Advanced Practice Providers (APPs -  Physician Assistants and Nurse Practitioners) who all work together to provide you with the care you need, when you need it.  We recommend signing up for the patient portal called "MyChart".  Sign up information is provided on this After Visit Summary.  MyChart is used to connect with patients for Virtual Visits (Telemedicine).  Patients are able to view lab/test results, encounter notes, upcoming appointments, etc.  Non-urgent messages can be sent to your provider as well.   To learn more about what you can do with MyChart, go to [email or web address].    Your next appointment:   2 week(s) after Cath  The format for your next appointment:   In Person  Provider:   Berniece Salines, DO  Other Instructions

## 2020-08-13 ENCOUNTER — Telehealth: Payer: Self-pay | Admitting: Cardiology

## 2020-08-13 DIAGNOSIS — Z794 Long term (current) use of insulin: Secondary | ICD-10-CM

## 2020-08-13 DIAGNOSIS — R931 Abnormal findings on diagnostic imaging of heart and coronary circulation: Secondary | ICD-10-CM

## 2020-08-13 DIAGNOSIS — I5020 Unspecified systolic (congestive) heart failure: Secondary | ICD-10-CM

## 2020-08-13 DIAGNOSIS — I10 Essential (primary) hypertension: Secondary | ICD-10-CM

## 2020-08-13 DIAGNOSIS — R9389 Abnormal findings on diagnostic imaging of other specified body structures: Secondary | ICD-10-CM | POA: Insufficient documentation

## 2020-08-13 DIAGNOSIS — Z8673 Personal history of transient ischemic attack (TIA), and cerebral infarction without residual deficits: Secondary | ICD-10-CM

## 2020-08-13 DIAGNOSIS — E118 Type 2 diabetes mellitus with unspecified complications: Secondary | ICD-10-CM | POA: Insufficient documentation

## 2020-08-13 DIAGNOSIS — I4891 Unspecified atrial fibrillation: Secondary | ICD-10-CM | POA: Insufficient documentation

## 2020-08-13 DIAGNOSIS — I447 Left bundle-branch block, unspecified: Secondary | ICD-10-CM | POA: Insufficient documentation

## 2020-08-13 DIAGNOSIS — E782 Mixed hyperlipidemia: Secondary | ICD-10-CM

## 2020-08-13 HISTORY — DX: Long term (current) use of insulin: Z79.4

## 2020-08-13 HISTORY — DX: Personal history of transient ischemic attack (TIA), and cerebral infarction without residual deficits: Z86.73

## 2020-08-13 HISTORY — DX: Abnormal findings on diagnostic imaging of other specified body structures: R93.89

## 2020-08-13 HISTORY — DX: Type 2 diabetes mellitus with unspecified complications: E11.8

## 2020-08-13 HISTORY — DX: Unspecified systolic (congestive) heart failure: I50.20

## 2020-08-13 HISTORY — DX: Left bundle-branch block, unspecified: I44.7

## 2020-08-13 HISTORY — DX: Mixed hyperlipidemia: E78.2

## 2020-08-13 HISTORY — DX: Abnormal findings on diagnostic imaging of heart and coronary circulation: R93.1

## 2020-08-13 HISTORY — DX: Unspecified atrial fibrillation: I48.91

## 2020-08-13 HISTORY — DX: Essential (primary) hypertension: I10

## 2020-08-13 LAB — CBC WITH DIFFERENTIAL/PLATELET
Basophils Absolute: 0.1 10*3/uL (ref 0.0–0.2)
Basos: 1 %
EOS (ABSOLUTE): 0.2 10*3/uL (ref 0.0–0.4)
Eos: 2 %
Hematocrit: 45.2 % (ref 37.5–51.0)
Hemoglobin: 15.1 g/dL (ref 13.0–17.7)
Immature Grans (Abs): 0.1 10*3/uL (ref 0.0–0.1)
Immature Granulocytes: 1 %
Lymphocytes Absolute: 1.7 10*3/uL (ref 0.7–3.1)
Lymphs: 18 %
MCH: 29.4 pg (ref 26.6–33.0)
MCHC: 33.4 g/dL (ref 31.5–35.7)
MCV: 88 fL (ref 79–97)
Monocytes Absolute: 0.8 10*3/uL (ref 0.1–0.9)
Monocytes: 9 %
Neutrophils Absolute: 6.5 10*3/uL (ref 1.4–7.0)
Neutrophils: 69 %
Platelets: 235 10*3/uL (ref 150–450)
RBC: 5.14 x10E6/uL (ref 4.14–5.80)
RDW: 13.5 % (ref 11.6–15.4)
WBC: 9.3 10*3/uL (ref 3.4–10.8)

## 2020-08-13 LAB — BASIC METABOLIC PANEL
BUN/Creatinine Ratio: 22 (ref 10–24)
BUN: 32 mg/dL — ABNORMAL HIGH (ref 8–27)
CO2: 20 mmol/L (ref 20–29)
Calcium: 9.8 mg/dL (ref 8.6–10.2)
Chloride: 104 mmol/L (ref 96–106)
Creatinine, Ser: 1.45 mg/dL — ABNORMAL HIGH (ref 0.76–1.27)
GFR calc Af Amer: 53 mL/min/{1.73_m2} — ABNORMAL LOW (ref >59)
GFR calc non Af Amer: 46 mL/min/{1.73_m2} — ABNORMAL LOW (ref >59)
Glucose: 231 mg/dL — ABNORMAL HIGH (ref 65–99)
Potassium: 5.2 mmol/L (ref 3.5–5.2)
Sodium: 140 mmol/L (ref 134–144)

## 2020-08-13 LAB — MAGNESIUM: Magnesium: 1.6 mg/dL (ref 1.6–2.3)

## 2020-08-13 NOTE — Progress Notes (Signed)
Cardiology Office Note:    Date:  08/13/2020   ID:  Thomas Mckee, DOB 06/22/1940, MRN [id number]  PCP:  Thomas Shivers, MD  Cardiologist:  Berniece Salines, DO  Electrophysiologist:  None   Referring MD: Thomas Shivers, MD   I am here because I had a stroke.  History of Present Illness:    Thomas Mckee is a 80 y.o. male with a hx of hypertension, history of CVA, hyperlipidemia, heart failure with reduced ejection fraction recently diagnosed recent EF 35 to 40%, diabetes mellitus insulin-dependent. The patient is here to establish cardiac care on the request of his primary care doctor Dr. Maryella Mckee. The patient tells me that on February 12 he started to feel that he could not remember anything he was unable to adjust his television he fell his hand was weak he was taken to the emergency department at [facility] where he was noted to cva. his imaging show evidence of previous cerebellar stroke on the CT scan.  Based on his symptoms he was admitted to the hospital for further imaging as well as neurological care.  While in the hospital he had a echocardiogram which showed newly found depressed EF with wall motion abnormalities.  He was also noted to have left bundle branch block with no prior EKG.  Today the patient tells me that he has had some intermittent chest pain.  And shortness of breath.  He states that before his hospitalization he would have left-sided chest discomfort which will last for seconds and resolved.  Of present he has not had significant experience in the hospital and been placed on different medications.  He has never passed.   Past Medical History:  Diagnosis Date  . Carotid stenosis   . Cerebral arteriosclerosis with history of previous cerebrovascular accident   . Chronic kidney disease, stage 3 ([place])   . DDD (degenerative disc disease), lumbar 02/28/2019   Last Assessment & Plan:  Formatting of this note might be different from the original. See  status post lumbar laminectomy plan  . Lumbar radiculopathy 02/28/2019   Last Assessment & Plan:  Formatting of this note might be different from the original. Continue 800 mg gabapentin t.i.d..  Unable to obtain Lyrica due to cost  . Neck pain 02/28/2019   Last Assessment & Plan:  Formatting of this note might be different from the original. Chronic neck pain with radiation to the bilateral shoulders, left greater than right.  He reports having plain films completed by chiropractor do not have access to these.  Will consider updating imaging for possible procedural consideration of persistent symptoms.  . Other chronic pain 02/28/2019  . Pain management contract agreement 06/27/2019   Last Assessment & Plan:  Formatting of this note might be different from the original. Prior UDS results are consistent with regimen, [place] controlled substance registry is checked and is also consistent.  . S/P insertion of spinal cord stimulator 02/28/2019   Last Assessment & Plan:  Formatting of this note might be different from the original. He has established with [employer] rep who is helping him to make adjustments to his programming accordingly.  He needs to follow-up with them as directed  . Status post lumbar laminectomy 02/28/2019   Last Assessment & Plan:  Formatting of this note is different from the original. 80 year old male with chronic low back and bilateral lower extremity radicular symptoms status post lumbar laminectomy with subsequent spinal cord stimulator placement. Recently updated plain films of the thoracic and  lumbar spine show moderate lower lumbar facet arthrosis with indwellingThoracic stimulatorleadsce  . Systolic CHF [facility])     Past Surgical History:  Procedure Laterality Date  . BACK SURGERY    . REPLACEMENT TOTAL KNEE Right   . SHOULDER SURGERY      Current Medications: Current Meds  Medication Sig  . apixaban (ELIQUIS) 5 MG TABS tablet Take 1 tablet (5 mg total) by mouth 2  (two) times daily.  Marland Kitchen aspirin EC 81 MG tablet Take 81 mg by mouth daily. Swallow whole.  . carvedilol (COREG) 3.125 MG tablet Take 3.125 mg by mouth 2 (two) times daily with a meal.  . clopidogrel (PLAVIX) 75 MG tablet Take 75 mg by mouth daily.  . DULoxetine (CYMBALTA) 30 MG capsule Take 30 mg by mouth daily.  Marland Kitchen gabapentin (NEURONTIN) 800 MG tablet Take 800 mg by mouth 3 (three) times daily.  Marland Kitchen HYDROcodone-acetaminophen (NORCO/VICODIN) 5-325 MG tablet Take 1 tablet by mouth every 6 (six) hours as needed for moderate pain.  Marland Kitchen lisinopril (ZESTRIL) 5 MG tablet Take 5 mg by mouth daily.  . metFORMIN (GLUCOPHAGE) 1000 MG tablet Take 1,000 mg by mouth 2 (two) times daily with a meal.  . simvastatin (ZOCOR) 80 MG tablet Take 80 mg by mouth at bedtime.  . [DISCONTINUED] naproxen (NAPROSYN) 500 MG tablet Take 500 mg by mouth 2 (two) times daily with a meal.     Allergies:   Morphine   Social History   Socioeconomic History  . Marital status: Divorced    Spouse name: Not on file  . Number of children: Not on file  . Years of education: Not on file  . Highest education level: Not on file  Occupational History  . Not on file  Tobacco Use  . Smoking status: Not on file  . Smokeless tobacco: Not on file  Substance and Sexual Activity  . Alcohol use: Not on file  . Drug use: Never  . Sexual activity: Not on file  Other Topics Concern  . Not on file  Social History Narrative  . Not on file   Social Determinants of Health   Financial Resource Strain: Not on file  Food Insecurity: Not on file  Transportation Needs: Not on file  Physical Activity: Not on file  Stress: Not on file  Social Connections: Not on file     Family History: The patient's family history includes Cancer in his brother; Diabetes in his father and sister.  ROS:   Review of Systems  Constitution: Negative for decreased appetite, fever and weight gain.  HENT: Negative for congestion, ear discharge, hoarse voice  and sore throat.   Eyes: Negative for discharge, redness, vision loss in right eye and visual halos.  Cardiovascular: Negative for chest pain, dyspnea on exertion, leg swelling, orthopnea and palpitations.  Respiratory: Negative for cough, hemoptysis, shortness of breath and snoring.   Endocrine: Negative for heat intolerance and polyphagia.  Hematologic/Lymphatic: Negative for bleeding problem. Does not bruise/bleed easily.  Skin: Negative for flushing, nail changes, rash and suspicious lesions.  Musculoskeletal: Negative for arthritis, joint pain, muscle cramps, myalgias, neck pain and stiffness.  Gastrointestinal: Negative for abdominal pain, bowel incontinence, diarrhea and excessive appetite.  Genitourinary: Negative for decreased libido, genital sores and incomplete emptying.  Neurological: Negative for brief paralysis, focal weakness, headaches and loss of balance.  Psychiatric/Behavioral: Negative for altered mental status, depression and suicidal ideas.  Allergic/Immunologic: Negative for HIV exposure and persistent infections.    EKGs/Labs/Other Studies Reviewed:  The following studies were reviewed today:   EKG:  The ekg ordered today demonstrates atrial fibrillation with controlled ventricular rate and underlying left bundle branch block compared to EKG which was done at [facility] at which time it appeared the patient was also in sinus rhythm.  Echocardiogram done on February 30 2022 reports moderately impaired LV function 35 to 40%.  Basal inferoseptal LV wall motion is hypokinetic.  Mid anterior LV wall motion is hypokinetic.  Mid anteroseptal LV wall motion is hypokinetic.  Apical septal LV wall motion hypokinetic.  There is paradoxical motion of the ventricular septum which is consistent with left bundle branch block.  Left atrium is mildly dilated.  Moderate aortic valve sclerosis.  Recent Labs: 08/12/2020: BUN 32; Creatinine, Ser 1.45; Hemoglobin 15.1; Magnesium  1.6; Platelets 235; Potassium 5.2; Sodium 140  Recent Lipid Panel No results found for: CHOL, TRIG, HDL, CHOLHDL, VLDL, LDLCALC, LDLDIRECT  Physical Exam:    VS:  BP 126/68 (BP Location: Right Arm)   Pulse 94   Ht 6\' 1"  (1.854 m)   Wt 230 lb 3.2 oz (104.4 kg)   SpO2 98%   BMI 30.37 kg/m     Wt Readings from Last 3 Encounters:  08/12/20 230 lb 3.2 oz (104.4 kg)  08/11/20 228 lb (103.4 kg)     GEN: Well nourished, well developed in no acute distress HEENT: Normal NECK: No JVD; No carotid bruits LYMPHATICS: No lymphadenopathy CARDIAC: S1S2 noted,RRR, no murmurs, rubs, gallops RESPIRATORY:  Clear to auscultation without rales, wheezing or rhonchi  ABDOMEN: Soft, non-tender, non-distended, +bowel sounds, no guarding. EXTREMITIES: No edema, No cyanosis, no clubbing MUSCULOSKELETAL:  No deformity  SKIN: Warm and dry NEUROLOGIC:  Alert and oriented x 3, non-focal PSYCHIATRIC:  Normal affect, good insight  ASSESSMENT:    1. Abnormal echocardiogram   2. Abnormal ventricular wall motion   3. Atrial fibrillation, unspecified type ([place])   4. HFrEF (heart failure with reduced ejection fraction) ([place])   5. LBBB (left bundle branch block)   6. History of CVA (cerebrovascular accident)   7. Type 2 diabetes mellitus with complication, with long-term current use of insulin ([place])   8. Primary hypertension   9. Mixed hyperlipidemia    PLAN:    His wall motion abnormality seen is in the LAD territory, therefore with a depressed EF and the left bundle branch block I like to pursue left heart catheterization in this patient.  He has high risk with diabetes, hypertension, hyperlipidemia and he also does have some symptoms.  I have discussed with the patient have educated him about left heart catheterization he is agreeable to proceed.  The patient understands that risks include but are not limited to stroke (1 in 1000), death (1 in 43), kidney failure [usually temporary] (1 in 500), bleeding  (1 in 200), allergic reaction [possibly serious] (1 in 200), and agrees to proceed.  He is on aspirin and Plavix right now we will continue this he is also is on simvastatin.  He is in atrial fibrillation today which appears to be new.  I talked to the about the treatment goals for atrial fibrillation rate control, rhythm control and anticoagulation.  He is rate controlled today.  We will hold off on rhythm control until after his left heart catheterization and we can discuss this.  However stroke prevention in this patient is very important his CHA2DS2-VASc score 6.  He should be anticoagulated.  We have opted for Eliquis 5 mg twice a day.  But what I like to do since he is going to be getting his left heart catheterization early next week we will keep him on his aspirin and Plavix and then once he has had his cath we can start his Eliquis.  Based on the results of the cath at his follow-up visit we will discuss more about his anticoagulation and antiplatelet use.  This is being managed by his primary care doctor.  No adjustments for antidiabetic medications were made today.  Hyperlipidemia - continue with current statin medication.  Blood pressure is acceptable, continue with current antihypertensive regimen.  We also will need to revisit his medications for his depressed ejection fraction at his next visit.  He is currently is on lisinopril 5 mg daily, carvedilol 3.125 mg.  Ideally I would like to transition the patient off lisinopril and put him on Entresto.  Blood work will be done for CBC, BMP and mag today.   The patient is in agreement with the above plan. The patient left the office in stable condition.  The patient will follow up in 2 weeks post heart catheterization.   Medication Adjustments/Labs and Tests Ordered: Current medicines are reviewed at length with the patient today.  Concerns regarding medicines are outlined above.  Orders Placed This Encounter  Procedures  . Basic  metabolic panel  . Magnesium  . CBC with Differential/Platelet  . EKG 12-Lead   Meds ordered this encounter  Medications  . apixaban (ELIQUIS) 5 MG TABS tablet    Sig: Take 1 tablet (5 mg total) by mouth 2 (two) times daily.    Dispense:  180 tablet    Refill:  3    Patient Instructions  Medication Instructions:  Your physician has recommended you make the following change in your medication:  START: Eliquis 5 mg after twice day (After Heart Cath.)    *If you need a refill on your cardiac medications before your next appointment, please call your pharmacy*   Lab Work: Your physician recommends that you return for lab work: TODAY: BMET, Mag, CBC  If you have labs (blood work) drawn today and your tests are completely normal, you will receive your results only by: . MyChart Message (if you have MyChart) OR . A paper copy in the mail If you have any lab test that is abnormal or we need to change your treatment, we will call you to review the results.   Testing/Procedures:    [facility] [place] [address] [place] [address] Dept: [phone] Loc: [phone]  Lattie Vierra  08/12/2020  You are scheduled for a Cardiac Catheterization on Tuesday, March 1 with Dr. Jayadeep Varanasi.  1. Please arrive at the [place] (Main Entrance A) at [facility]: [address] [place], [place] [address] at 10:00 AM (This time is two hours before your procedure to ensure your preparation). Free valet parking service is available.   Special note: Every effort is made to have your procedure done on time. Please understand that emergencies sometimes delay scheduled procedures.  2. Diet: Do not eat solid foods after midnight.  The patient may have clear liquids until 5am upon the day of the procedure.  3. Labs: You will need to have blood drawn on TODAY.  4. Medication instructions in preparation  for your procedure:   Contrast Allergy: No  Do not take Diabetes Med Glucophage (Metformin) on the day of the procedure and HOLD 48 HOURS   AFTER THE PROCEDURE.  On the morning of your procedure, take your Aspirin and any morning medicines NOT listed above.  You may use sips of water.  5. Plan for one night stay--bring personal belongings. 6. Bring a current list of your medications and current insurance cards. 7. You MUST have a responsible person to drive you home. 8. Someone MUST be with you the first 24 hours after you arrive home or your discharge will be delayed. 9. Please wear clothes that are easy to get on and off and wear slip-on shoes.  Thank you for allowing [place] to care for you!   -- [place] [facility]    Follow-Up: At [facility], you and your health needs are our priority.  As part of our continuing mission to provide you with exceptional heart care, we have created designated Provider Care Teams.  These Care Teams include your primary Cardiologist (physician) and Advanced Practice Providers (APPs -  Physician Assistants and Nurse Practitioners) who all work together to provide you with the care you need, when you need it.  We recommend signing up for the patient portal called "MyChart".  Sign up information is provided on this After Visit Summary.  MyChart is used to connect with patients for Virtual Visits (Telemedicine).  Patients are able to view lab/test results, encounter notes, upcoming appointments, etc.  Non-urgent messages can be sent to your provider as well.   To learn more about what you can do with MyChart, go to [email or web address].    Your next appointment:   2 week(s) after Cath  The format for your next appointment:   In Person  Provider:   Berniece Salines, DO   Other Instructions       Adopting a Healthy Lifestyle.  Know what a healthy weight is for you (roughly BMI <25) and aim to maintain this   Aim for 7+  servings of fruits and vegetables daily   65-80+ fluid ounces of water or unsweet tea for healthy kidneys   Limit to max 1 drink of alcohol per day; avoid smoking/tobacco   Limit animal fats in diet for cholesterol and heart health - choose grass fed whenever available   Avoid highly processed foods, and foods high in saturated/trans fats   Aim for low stress - take time to unwind and care for your mental health   Aim for 150 min of moderate intensity exercise weekly for heart health, and weights twice weekly for bone health   Aim for 7-9 hours of sleep daily   When it comes to diets, agreement about the perfect plan isnt easy to find, even among the experts. Experts at the [employer] developed an idea known as the Healthy Eating Plate. Just imagine a plate divided into logical, healthy portions.   The emphasis is on diet quality:   Load up on vegetables and fruits - one-half of your plate: Aim for color and variety, and remember that potatoes dont count.   Go for whole grains - one-quarter of your plate: Whole wheat, barley, wheat berries, quinoa, oats, brown rice, and foods made with them. If you want pasta, go with whole wheat pasta.   Protein power - one-quarter of your plate: Fish, chicken, beans, and nuts are all healthy, versatile protein sources. Limit red meat.   The diet, however, does go beyond the plate, offering a few other suggestions.   Use healthy plant oils, such as olive, canola, soy, corn, sunflower and peanut.  Check the labels, and avoid partially hydrogenated oil, which have unhealthy trans fats.   If youre thirsty, drink water. Coffee and tea are good in moderation, but skip sugary drinks and limit milk and dairy products to one or two daily servings.   The type of carbohydrate in the diet is more important than the amount. Some sources of carbohydrates, such as vegetables, fruits, whole grains, and beans-are healthier than others.    Finally, stay active  Signed, Berniece Salines, DO  08/13/2020 11:52 AM    [place]

## 2020-08-13 NOTE — Telephone Encounter (Signed)
Okay, Thank you for looking into that Mrs. Lankford.

## 2020-08-13 NOTE — Telephone Encounter (Signed)
Patient has a procedure scheduled for 08/18/20 with Dr. Irish Lack. He states he will not have anyone to assist or drive him home post-op. He states he contacted a close relative and and has not heard back from them. He would like to know if he needs pre-approval to stay in the hospital afterwards or at least until he is able to drive himself home. Because the procedure is with Dr. Irish Lack, I contacted his nurse via secure chat for advisement. Per Dr. Hassell Done nurse, the patient will have to contact Dr. Terrial Rhodes office as she is his general cardiologist. I made her aware that I am routing a message to Dr. Terrial Rhodes office. Is anyone able to make arrangements for patient to stay in hospital after procedure? Please advise.

## 2020-08-13 NOTE — Telephone Encounter (Signed)
Pt states he does not have any family in the area to help him. Pt states a friend has agreed to drive him home after the procedure, and he will ask that person if they would be willing to stay with him the first 24 hours home if same day discharge. Pt states if that does not work out, he will contact his insurance company to see if they can help with responsible adult to be with him the first 24 hours home.  Pt knows to call me if unable to work out transportation home and responsible adult first 24 hours home if same day discharge.

## 2020-08-13 NOTE — H&P (View-Only) (Signed)
Cardiology Office Note:    Date:  08/13/2020   ID:  Thomas Mckee, DOB 1940-10-20, MRN [id number]  PCP:  Maryella Shivers, MD  Cardiologist:  Berniece Salines, DO  Electrophysiologist:  None   Referring MD: Maryella Shivers, MD   I am here because I had a stroke.  History of Present Illness:    Thomas Mckee is a 80 y.o. male with a hx of hypertension, history of CVA, hyperlipidemia, heart failure with reduced ejection fraction recently diagnosed recent EF 35 to 40%, diabetes mellitus insulin-dependent. The patient is here to establish cardiac care on the request of his primary care doctor Dr. Maryella Shivers. The patient tells me that on February 12 he started to feel that he could not remember anything he was unable to adjust his television he fell his hand was weak he was taken to the emergency department at [facility] where he was noted to cva. his imaging show evidence of previous cerebellar stroke on the CT scan.  Based on his symptoms he was admitted to the hospital for further imaging as well as neurological care.  While in the hospital he had a echocardiogram which showed newly found depressed EF with wall motion abnormalities.  He was also noted to have left bundle branch block with no prior EKG.  Today the patient tells me that he has had some intermittent chest pain.  And shortness of breath.  He states that before his hospitalization he would have left-sided chest discomfort which will last for seconds and resolved.  Of present he has not had significant experience in the hospital and been placed on different medications.  He has never passed.   Past Medical History:  Diagnosis Date  . Carotid stenosis   . Cerebral arteriosclerosis with history of previous cerebrovascular accident   . Chronic kidney disease, stage 3 ([place])   . DDD (degenerative disc disease), lumbar 02/28/2019   Last Assessment & Plan:  Formatting of this note might be different from the original. See  status post lumbar laminectomy plan  . Lumbar radiculopathy 02/28/2019   Last Assessment & Plan:  Formatting of this note might be different from the original. Continue 800 mg gabapentin t.i.d..  Unable to obtain Lyrica due to cost  . Neck pain 02/28/2019   Last Assessment & Plan:  Formatting of this note might be different from the original. Chronic neck pain with radiation to the bilateral shoulders, left greater than right.  He reports having plain films completed by chiropractor do not have access to these.  Will consider updating imaging for possible procedural consideration of persistent symptoms.  . Other chronic pain 02/28/2019  . Pain management contract agreement 06/27/2019   Last Assessment & Plan:  Formatting of this note might be different from the original. Prior UDS results are consistent with regimen, [place] controlled substance registry is checked and is also consistent.  . S/P insertion of spinal cord stimulator 02/28/2019   Last Assessment & Plan:  Formatting of this note might be different from the original. He has established with [employer] rep who is helping him to make adjustments to his programming accordingly.  He needs to follow-up with them as directed  . Status post lumbar laminectomy 02/28/2019   Last Assessment & Plan:  Formatting of this note is different from the original. 80 year old male with chronic low back and bilateral lower extremity radicular symptoms status post lumbar laminectomy with subsequent spinal cord stimulator placement. Recently updated plain films of the thoracic and  lumbar spine show moderate lower lumbar facet arthrosis with indwellingThoracic stimulatorleadsce  . Systolic CHF [facility])     Past Surgical History:  Procedure Laterality Date  . BACK SURGERY    . REPLACEMENT TOTAL KNEE Right   . SHOULDER SURGERY      Current Medications: Current Meds  Medication Sig  . apixaban (ELIQUIS) 5 MG TABS tablet Take 1 tablet (5 mg total) by mouth 2  (two) times daily.  Marland Kitchen aspirin EC 81 MG tablet Take 81 mg by mouth daily. Swallow whole.  . carvedilol (COREG) 3.125 MG tablet Take 3.125 mg by mouth 2 (two) times daily with a meal.  . clopidogrel (PLAVIX) 75 MG tablet Take 75 mg by mouth daily.  . DULoxetine (CYMBALTA) 30 MG capsule Take 30 mg by mouth daily.  Marland Kitchen gabapentin (NEURONTIN) 800 MG tablet Take 800 mg by mouth 3 (three) times daily.  Marland Kitchen HYDROcodone-acetaminophen (NORCO/VICODIN) 5-325 MG tablet Take 1 tablet by mouth every 6 (six) hours as needed for moderate pain.  Marland Kitchen lisinopril (ZESTRIL) 5 MG tablet Take 5 mg by mouth daily.  . metFORMIN (GLUCOPHAGE) 1000 MG tablet Take 1,000 mg by mouth 2 (two) times daily with a meal.  . simvastatin (ZOCOR) 80 MG tablet Take 80 mg by mouth at bedtime.  . [DISCONTINUED] naproxen (NAPROSYN) 500 MG tablet Take 500 mg by mouth 2 (two) times daily with a meal.     Allergies:   Morphine   Social History   Socioeconomic History  . Marital status: Divorced    Spouse name: Not on file  . Number of children: Not on file  . Years of education: Not on file  . Highest education level: Not on file  Occupational History  . Not on file  Tobacco Use  . Smoking status: Not on file  . Smokeless tobacco: Not on file  Substance and Sexual Activity  . Alcohol use: Not on file  . Drug use: Never  . Sexual activity: Not on file  Other Topics Concern  . Not on file  Social History Narrative  . Not on file   Social Determinants of Health   Financial Resource Strain: Not on file  Food Insecurity: Not on file  Transportation Needs: Not on file  Physical Activity: Not on file  Stress: Not on file  Social Connections: Not on file     Family History: The patient's family history includes Cancer in his brother; Diabetes in his father and sister.  ROS:   Review of Systems  Constitution: Negative for decreased appetite, fever and weight gain.  HENT: Negative for congestion, ear discharge, hoarse voice  and sore throat.   Eyes: Negative for discharge, redness, vision loss in right eye and visual halos.  Cardiovascular: Negative for chest pain, dyspnea on exertion, leg swelling, orthopnea and palpitations.  Respiratory: Negative for cough, hemoptysis, shortness of breath and snoring.   Endocrine: Negative for heat intolerance and polyphagia.  Hematologic/Lymphatic: Negative for bleeding problem. Does not bruise/bleed easily.  Skin: Negative for flushing, nail changes, rash and suspicious lesions.  Musculoskeletal: Negative for arthritis, joint pain, muscle cramps, myalgias, neck pain and stiffness.  Gastrointestinal: Negative for abdominal pain, bowel incontinence, diarrhea and excessive appetite.  Genitourinary: Negative for decreased libido, genital sores and incomplete emptying.  Neurological: Negative for brief paralysis, focal weakness, headaches and loss of balance.  Psychiatric/Behavioral: Negative for altered mental status, depression and suicidal ideas.  Allergic/Immunologic: Negative for HIV exposure and persistent infections.    EKGs/Labs/Other Studies Reviewed:  The following studies were reviewed today:   EKG:  The ekg ordered today demonstrates atrial fibrillation with controlled ventricular rate and underlying left bundle branch block compared to EKG which was done at [facility] at which time it appeared the patient was also in sinus rhythm.  Echocardiogram done on February 30 2022 reports moderately impaired LV function 35 to 40%.  Basal inferoseptal LV wall motion is hypokinetic.  Mid anterior LV wall motion is hypokinetic.  Mid anteroseptal LV wall motion is hypokinetic.  Apical septal LV wall motion hypokinetic.  There is paradoxical motion of the ventricular septum which is consistent with left bundle branch block.  Left atrium is mildly dilated.  Moderate aortic valve sclerosis.  Recent Labs: 08/12/2020: BUN 32; Creatinine, Ser 1.45; Hemoglobin 15.1; Magnesium  1.6; Platelets 235; Potassium 5.2; Sodium 140  Recent Lipid Panel No results found for: CHOL, TRIG, HDL, CHOLHDL, VLDL, LDLCALC, LDLDIRECT  Physical Exam:    VS:  BP 126/68 (BP Location: Right Arm)   Pulse 94   Ht 6\' 1"  (1.854 m)   Wt 230 lb 3.2 oz (104.4 kg)   SpO2 98%   BMI 30.37 kg/m     Wt Readings from Last 3 Encounters:  08/12/20 230 lb 3.2 oz (104.4 kg)  08/11/20 228 lb (103.4 kg)     GEN: Well nourished, well developed in no acute distress HEENT: Normal NECK: No JVD; No carotid bruits LYMPHATICS: No lymphadenopathy CARDIAC: S1S2 noted,RRR, no murmurs, rubs, gallops RESPIRATORY:  Clear to auscultation without rales, wheezing or rhonchi  ABDOMEN: Soft, non-tender, non-distended, +bowel sounds, no guarding. EXTREMITIES: No edema, No cyanosis, no clubbing MUSCULOSKELETAL:  No deformity  SKIN: Warm and dry NEUROLOGIC:  Alert and oriented x 3, non-focal PSYCHIATRIC:  Normal affect, good insight  ASSESSMENT:    1. Abnormal echocardiogram   2. Abnormal ventricular wall motion   3. Atrial fibrillation, unspecified type ([place])   4. HFrEF (heart failure with reduced ejection fraction) ([place])   5. LBBB (left bundle branch block)   6. History of CVA (cerebrovascular accident)   7. Type 2 diabetes mellitus with complication, with long-term current use of insulin ([place])   8. Primary hypertension   9. Mixed hyperlipidemia    PLAN:    His wall motion abnormality seen is in the LAD territory, therefore with a depressed EF and the left bundle branch block I like to pursue left heart catheterization in this patient.  He has high risk with diabetes, hypertension, hyperlipidemia and he also does have some symptoms.  I have discussed with the patient have educated him about left heart catheterization he is agreeable to proceed.  The patient understands that risks include but are not limited to stroke (1 in 1000), death (1 in 2), kidney failure [usually temporary] (1 in 500), bleeding  (1 in 200), allergic reaction [possibly serious] (1 in 200), and agrees to proceed.  He is on aspirin and Plavix right now we will continue this he is also is on simvastatin.  He is in atrial fibrillation today which appears to be new.  I talked to the about the treatment goals for atrial fibrillation rate control, rhythm control and anticoagulation.  He is rate controlled today.  We will hold off on rhythm control until after his left heart catheterization and we can discuss this.  However stroke prevention in this patient is very important his CHA2DS2-VASc score 6.  He should be anticoagulated.  We have opted for Eliquis 5 mg twice a day.  But what I like to do since he is going to be getting his left heart catheterization early next week we will keep him on his aspirin and Plavix and then once he has had his cath we can start his Eliquis.  Based on the results of the cath at his follow-up visit we will discuss more about his anticoagulation and antiplatelet use.  This is being managed by his primary care doctor.  No adjustments for antidiabetic medications were made today.  Hyperlipidemia - continue with current statin medication.  Blood pressure is acceptable, continue with current antihypertensive regimen.  We also will need to revisit his medications for his depressed ejection fraction at his next visit.  He is currently is on lisinopril 5 mg daily, carvedilol 3.125 mg.  Ideally I would like to transition the patient off lisinopril and put him on Entresto.  Blood work will be done for CBC, BMP and mag today.   The patient is in agreement with the above plan. The patient left the office in stable condition.  The patient will follow up in 2 weeks post heart catheterization.   Medication Adjustments/Labs and Tests Ordered: Current medicines are reviewed at length with the patient today.  Concerns regarding medicines are outlined above.  Orders Placed This Encounter  Procedures  . Basic  metabolic panel  . Magnesium  . CBC with Differential/Platelet  . EKG 12-Lead   Meds ordered this encounter  Medications  . apixaban (ELIQUIS) 5 MG TABS tablet    Sig: Take 1 tablet (5 mg total) by mouth 2 (two) times daily.    Dispense:  180 tablet    Refill:  3    Patient Instructions  Medication Instructions:  Your physician has recommended you make the following change in your medication:  START: Eliquis 5 mg after twice day (After Heart Cath.)    *If you need a refill on your cardiac medications before your next appointment, please call your pharmacy*   Lab Work: Your physician recommends that you return for lab work: TODAY: BMET, Mag, CBC  If you have labs (blood work) drawn today and your tests are completely normal, you will receive your results only by: Marland Kitchen MyChart Message (if you have MyChart) OR . A paper copy in the mail If you have any lab test that is abnormal or we need to change your treatment, we will call you to review the results.   Testing/Procedures:    [place] [address] Dept: [phone] Loc: [phone]  Rondarius Kadrmas  08/12/2020  You are scheduled for a Cardiac Catheterization on Tuesday, March 1 with Dr. [name].  1. Please arrive at the [facility] (Main Entrance A) at [facility]: [address] [place], [place] [address] at 10:00 AM (This time is two hours before your procedure to ensure your preparation). Free valet parking service is available.   Special note: Every effort is made to have your procedure done on time. Please understand that emergencies sometimes delay scheduled procedures.  2. Diet: Do not eat solid foods after midnight.  The patient may have clear liquids until 5am upon the day of the procedure.  3. Labs: You will need to have blood drawn on TODAY.  4. Medication instructions in preparation  for your procedure:   Contrast Allergy: No  Do not take Diabetes Med Glucophage (Metformin) on the day of the procedure and HOLD 48 HOURS  AFTER THE PROCEDURE.  On the morning of your procedure, take your Aspirin and any morning medicines NOT listed above.  You may use sips of water.  5. Plan for one night stay--bring personal belongings. 6. Bring a current list of your medications and current insurance cards. 7. You MUST have a responsible person to drive you home. 8. Someone MUST be with you the first 24 hours after you arrive home or your discharge will be delayed. 9. Please wear clothes that are easy to get on and off and wear slip-on shoes.  Thank you for allowing [place] to care for you!   -- [place] [facility]    Follow-Up: At [facility], you and your health needs are our priority.  As part of our continuing mission to provide you with exceptional heart care, we have created designated Provider Care Teams.  These Care Teams include your primary Cardiologist (physician) and Advanced Practice Providers (APPs -  Physician Assistants and Nurse Practitioners) who all work together to provide you with the care you need, when you need it.  We recommend signing up for the patient portal called "MyChart".  Sign up information is provided on this After Visit Summary.  MyChart is used to connect with patients for Virtual Visits (Telemedicine).  Patients are able to view lab/test results, encounter notes, upcoming appointments, etc.  Non-urgent messages can be sent to your provider as well.   To learn more about what you can do with MyChart, go to [email or web address].    Your next appointment:   2 week(s) after Cath  The format for your next appointment:   In Person  Provider:   Berniece Salines, DO   Other Instructions       Adopting a Healthy Lifestyle.  Know what a healthy weight is for you (roughly BMI <25) and aim to maintain this   Aim for 7+  servings of fruits and vegetables daily   65-80+ fluid ounces of water or unsweet tea for healthy kidneys   Limit to max 1 drink of alcohol per day; avoid smoking/tobacco   Limit animal fats in diet for cholesterol and heart health - choose grass fed whenever available   Avoid highly processed foods, and foods high in saturated/trans fats   Aim for low stress - take time to unwind and care for your mental health   Aim for 150 min of moderate intensity exercise weekly for heart health, and weights twice weekly for bone health   Aim for 7-9 hours of sleep daily   When it comes to diets, agreement about the perfect plan isnt easy to find, even among the experts. Experts at the [employer] developed an idea known as the Healthy Eating Plate. Just imagine a plate divided into logical, healthy portions.   The emphasis is on diet quality:   Load up on vegetables and fruits - one-half of your plate: Aim for color and variety, and remember that potatoes dont count.   Go for whole grains - one-quarter of your plate: Whole wheat, barley, wheat berries, quinoa, oats, brown rice, and foods made with them. If you want pasta, go with whole wheat pasta.   Protein power - one-quarter of your plate: Fish, chicken, beans, and nuts are all healthy, versatile protein sources. Limit red meat.   The diet, however, does go beyond the plate, offering a few other suggestions.   Use healthy plant oils, such as olive, canola, soy, corn, sunflower and peanut.  Check the labels, and avoid partially hydrogenated oil, which have unhealthy trans fats.   If youre thirsty, drink water. Coffee and tea are good in moderation, but skip sugary drinks and limit milk and dairy products to one or two daily servings.   The type of carbohydrate in the diet is more important than the amount. Some sources of carbohydrates, such as vegetables, fruits, whole grains, and beans-are healthier than others.    Finally, stay active  Signed, Berniece Salines, DO  08/13/2020 11:52 AM    [place]

## 2020-08-14 ENCOUNTER — Telehealth: Payer: Self-pay | Admitting: Cardiology

## 2020-08-14 NOTE — Telephone Encounter (Signed)
Patient returning call to discuss lab results  ?

## 2020-08-14 NOTE — Telephone Encounter (Signed)
Patient returning call for lab results. 

## 2020-08-15 ENCOUNTER — Other Ambulatory Visit (HOSPITAL_COMMUNITY)
Admission: RE | Admit: 2020-08-15 | Discharge: 2020-08-15 | Disposition: A | Discharge status: Home / Self Care | Payer: HMO | Source: Ambulatory Visit | Attending: Interventional Cardiology | Admitting: Interventional Cardiology

## 2020-08-15 DIAGNOSIS — Z01812 Encounter for preprocedural laboratory examination: Secondary | ICD-10-CM | POA: Diagnosis not present

## 2020-08-15 DIAGNOSIS — Z20822 Contact with and (suspected) exposure to covid-19: Secondary | ICD-10-CM | POA: Diagnosis not present

## 2020-08-15 LAB — SARS CORONAVIRUS 2 (TAT 6-24 HRS): SARS Coronavirus 2: NEGATIVE

## 2020-08-17 ENCOUNTER — Telehealth: Payer: Self-pay | Admitting: *Deleted

## 2020-08-17 NOTE — Telephone Encounter (Addendum)
Pt contacted pre-catheterization scheduled at [facility] for: Tuesday August 18, 2020 12 Noon Verified arrival time and place: [facility]) at: 7 AM-pre-procedure hydration Per Dr Harriet Masson 08/13/20: Kidney function slightly elevated encouraged the patient to increase hydration. Let him know that the Cath Lab might want him to go early to get some IV hydration and that would be okay with me.  No solid food after midnight prior to cath, clear liquids until 5 AM day of procedure.  Hold: Metformin-day of procedure and 48 hours post procedure Jardiance-AM of procedure Insulin-1/2 usual HS dose prior to procedure Eliquis-has not started yet Lisinopril-day before and day of procedure-GFR 46  Except hold medications AM meds can be  taken pre-cath with sips of water including: ASA 81 mg    Confirmed patient has responsible adult to drive home post procedure and be with patient first 24 hours after arriving home: yes-* see notes below.  You are allowed ONE visitor in the waiting room during the time you are at the hospital for your procedure. Both you and your visitor must wear a mask once you enter the hospital.  Reviewed procedure/mask/visitor instructions, pre-procedure hydration with patient.  * Pt reports he may drive himself to the hospital in the morning and leave his car there overnight,  he would have responsible adult to drive him home and be with him the first 24 hours home if same day discharge tomorrow.

## 2020-08-18 ENCOUNTER — Ambulatory Visit (HOSPITAL_COMMUNITY)
Admission: RE | Admit: 2020-08-18 | Discharge: 2020-08-18 | Disposition: A | Discharge status: Home / Self Care | Payer: HMO | Attending: Interventional Cardiology | Admitting: Interventional Cardiology

## 2020-08-18 ENCOUNTER — Encounter (HOSPITAL_COMMUNITY)
Admission: RE | Disposition: A | Discharge status: Home / Self Care | Payer: Self-pay | Source: Home / Self Care | Attending: Interventional Cardiology

## 2020-08-18 ENCOUNTER — Other Ambulatory Visit: Payer: Self-pay

## 2020-08-18 DIAGNOSIS — E119 Type 2 diabetes mellitus without complications: Secondary | ICD-10-CM | POA: Insufficient documentation

## 2020-08-18 DIAGNOSIS — E1129 Type 2 diabetes mellitus with other diabetic kidney complication: Secondary | ICD-10-CM | POA: Diagnosis not present

## 2020-08-18 DIAGNOSIS — I110 Hypertensive heart disease with heart failure: Secondary | ICD-10-CM | POA: Insufficient documentation

## 2020-08-18 DIAGNOSIS — Z7901 Long term (current) use of anticoagulants: Secondary | ICD-10-CM | POA: Diagnosis not present

## 2020-08-18 DIAGNOSIS — I2510 Atherosclerotic heart disease of native coronary artery without angina pectoris: Secondary | ICD-10-CM

## 2020-08-18 DIAGNOSIS — I509 Heart failure, unspecified: Secondary | ICD-10-CM | POA: Diagnosis not present

## 2020-08-18 DIAGNOSIS — I5020 Unspecified systolic (congestive) heart failure: Secondary | ICD-10-CM | POA: Diagnosis not present

## 2020-08-18 DIAGNOSIS — N1831 Chronic kidney disease, stage 3a: Secondary | ICD-10-CM | POA: Diagnosis not present

## 2020-08-18 DIAGNOSIS — Z885 Allergy status to narcotic agent status: Secondary | ICD-10-CM | POA: Diagnosis not present

## 2020-08-18 DIAGNOSIS — Z7984 Long term (current) use of oral hypoglycemic drugs: Secondary | ICD-10-CM | POA: Diagnosis not present

## 2020-08-18 DIAGNOSIS — Z79899 Other long term (current) drug therapy: Secondary | ICD-10-CM | POA: Diagnosis not present

## 2020-08-18 DIAGNOSIS — E782 Mixed hyperlipidemia: Secondary | ICD-10-CM | POA: Insufficient documentation

## 2020-08-18 DIAGNOSIS — I447 Left bundle-branch block, unspecified: Secondary | ICD-10-CM | POA: Diagnosis not present

## 2020-08-18 DIAGNOSIS — I519 Heart disease, unspecified: Secondary | ICD-10-CM

## 2020-08-18 DIAGNOSIS — Z7902 Long term (current) use of antithrombotics/antiplatelets: Secondary | ICD-10-CM | POA: Diagnosis not present

## 2020-08-18 DIAGNOSIS — Z7982 Long term (current) use of aspirin: Secondary | ICD-10-CM | POA: Insufficient documentation

## 2020-08-18 DIAGNOSIS — I4891 Unspecified atrial fibrillation: Secondary | ICD-10-CM | POA: Insufficient documentation

## 2020-08-18 DIAGNOSIS — I672 Cerebral atherosclerosis: Secondary | ICD-10-CM | POA: Diagnosis not present

## 2020-08-18 HISTORY — PX: LEFT HEART CATH AND CORONARY ANGIOGRAPHY: CATH118249

## 2020-08-18 LAB — GLUCOSE, CAPILLARY: Glucose-Capillary: 153 mg/dL — ABNORMAL HIGH (ref 70–99)

## 2020-08-18 SURGERY — LEFT HEART CATH AND CORONARY ANGIOGRAPHY
Anesthesia: LOCAL

## 2020-08-18 MED ORDER — SODIUM CHLORIDE 0.9 % WEIGHT BASED INFUSION: 1.0000 mL/kg/h | INTRAVENOUS | Status: DC

## 2020-08-18 MED ORDER — SODIUM CHLORIDE 0.9% FLUSH: 3.0000 mL | Freq: Two times a day (BID) | INTRAVENOUS | Status: DC

## 2020-08-18 MED ORDER — SODIUM CHLORIDE 0.9 % IV SOLN: INTRAVENOUS | Status: DC

## 2020-08-18 MED ORDER — SODIUM CHLORIDE 0.9% FLUSH: 3.0000 mL | INTRAVENOUS | Status: DC | PRN

## 2020-08-18 MED ORDER — IOHEXOL 350 MG/ML SOLN
INTRAVENOUS | Status: DC | PRN
  Administered 2020-08-18: 60 mL via INTRA_ARTERIAL

## 2020-08-18 MED ORDER — HEPARIN (PORCINE) IN NACL 1000-0.9 UT/500ML-% IV SOLN
INTRAVENOUS | Status: DC | PRN
  Administered 2020-08-18 (×2): 500 mL

## 2020-08-18 MED ORDER — MIDAZOLAM HCL 2 MG/2ML IJ SOLN
INTRAMUSCULAR | Status: DC | PRN
  Administered 2020-08-18: 2 mg via INTRAVENOUS

## 2020-08-18 MED ORDER — ONDANSETRON HCL 4 MG/2ML IJ SOLN: 4.0000 mg | Freq: Four times a day (QID) | INTRAMUSCULAR | Status: DC | PRN

## 2020-08-18 MED ORDER — LABETALOL HCL 5 MG/ML IV SOLN: 10.0000 mg | INTRAVENOUS | Status: DC | PRN

## 2020-08-18 MED ORDER — HEPARIN (PORCINE) IN NACL 1000-0.9 UT/500ML-% IV SOLN
INTRAVENOUS | Status: AC
  Filled 2020-08-18: qty 1000

## 2020-08-18 MED ORDER — FENTANYL CITRATE (PF) 100 MCG/2ML IJ SOLN
INTRAMUSCULAR | Status: DC | PRN
  Administered 2020-08-18: 25 ug via INTRAVENOUS

## 2020-08-18 MED ORDER — HEPARIN SODIUM (PORCINE) 1000 UNIT/ML IJ SOLN
INTRAMUSCULAR | Status: DC | PRN
  Administered 2020-08-18: 5000 [IU] via INTRAVENOUS

## 2020-08-18 MED ORDER — VERAPAMIL HCL 2.5 MG/ML IV SOLN
INTRAVENOUS | Status: DC | PRN
  Administered 2020-08-18: 10 mL via INTRA_ARTERIAL

## 2020-08-18 MED ORDER — ASPIRIN 81 MG PO CHEW: 81.0000 mg | CHEWABLE_TABLET | ORAL | Status: DC

## 2020-08-18 MED ORDER — HEPARIN SODIUM (PORCINE) 1000 UNIT/ML IJ SOLN
INTRAMUSCULAR | Status: AC
  Filled 2020-08-18: qty 1

## 2020-08-18 MED ORDER — MIDAZOLAM HCL 2 MG/2ML IJ SOLN
INTRAMUSCULAR | Status: AC
  Filled 2020-08-18: qty 2

## 2020-08-18 MED ORDER — CLOPIDOGREL BISULFATE 75 MG PO TABS
75.0000 mg | ORAL_TABLET | Freq: Once | ORAL | Status: AC
  Administered 2020-08-18: 75 mg via ORAL
  Filled 2020-08-18: qty 1

## 2020-08-18 MED ORDER — LIDOCAINE HCL (PF) 1 % IJ SOLN
INTRAMUSCULAR | Status: DC | PRN
  Administered 2020-08-18: 2 mL

## 2020-08-18 MED ORDER — HYDRALAZINE HCL 20 MG/ML IJ SOLN: 10.0000 mg | INTRAMUSCULAR | Status: DC | PRN

## 2020-08-18 MED ORDER — FENTANYL CITRATE (PF) 100 MCG/2ML IJ SOLN
INTRAMUSCULAR | Status: AC
  Filled 2020-08-18: qty 2

## 2020-08-18 MED ORDER — SODIUM CHLORIDE 0.9 % IV SOLN: 250.0000 mL | INTRAVENOUS | Status: DC | PRN

## 2020-08-18 MED ORDER — SODIUM CHLORIDE 0.9 % WEIGHT BASED INFUSION
3.0000 mL/kg/h | INTRAVENOUS | Status: AC
  Administered 2020-08-18: 3 mL/kg/h via INTRAVENOUS

## 2020-08-18 MED ORDER — ACETAMINOPHEN 325 MG PO TABS: 650.0000 mg | ORAL_TABLET | ORAL | Status: DC | PRN

## 2020-08-18 MED ORDER — VERAPAMIL HCL 2.5 MG/ML IV SOLN
INTRAVENOUS | Status: AC
  Filled 2020-08-18: qty 2

## 2020-08-18 MED ORDER — LIDOCAINE HCL (PF) 1 % IJ SOLN
INTRAMUSCULAR | Status: AC
  Filled 2020-08-18: qty 30

## 2020-08-18 SURGICAL SUPPLY — 9 items
CATH 5FR JL3.5 JR4 ANG PIG MP (CATHETERS) ×2 IMPLANT
DEVICE RAD COMP TR BAND LRG (VASCULAR PRODUCTS) ×2 IMPLANT
GLIDESHEATH SLEND SS 6F .021 (SHEATH) ×2 IMPLANT
GUIDEWIRE INQWIRE 1.5J.035X260 (WIRE) ×1 IMPLANT
INQWIRE 1.5J .035X260CM (WIRE) ×2
KIT HEART LEFT (KITS) ×2 IMPLANT
PACK CARDIAC CATHETERIZATION (CUSTOM PROCEDURE TRAY) ×2 IMPLANT
TRANSDUCER W/STOPCOCK (MISCELLANEOUS) ×2 IMPLANT
TUBING CIL FLEX 10 FLL-RA (TUBING) ×2 IMPLANT

## 2020-08-18 NOTE — Discharge Instructions (Signed)
Radial Site Care  This sheet gives you information about how to care for yourself after your procedure. Your health care provider may also give you more specific instructions. If you have problems or questions, contact your health care provider. What can I expect after the procedure? After the procedure, it is common to have:  Bruising and tenderness at the catheter insertion area. Follow these instructions at home: Medicines  Take over-the-counter and prescription medicines only as told by your health care provider. Insertion site care  Follow instructions from your health care provider about how to take care of your insertion site. Make sure you: ? Wash your hands with soap and water before you change your bandage (dressing). If soap and water are not available, use hand sanitizer. ? Change your dressing as told by your health care provider. ? Leave stitches (sutures), skin glue, or adhesive strips in place. These skin closures may need to stay in place for 2 weeks or longer. If adhesive strip edges start to loosen and curl up, you may trim the loose edges. Do not remove adhesive strips completely unless your health care provider tells you to do that.  Check your insertion site every day for signs of infection. Check for: ? Redness, swelling, or pain. ? Fluid or blood. ? Pus or a bad smell. ? Warmth.  Do not take baths, swim, or use a hot tub until your health care provider approves.  You may shower 24-48 hours after the procedure, or as directed by your health care provider. ? Remove the dressing and gently wash the site with plain soap and water. ? Pat the area dry with a clean towel. ? Do not rub the site. That could cause bleeding.  Do not apply powder or lotion to the site. Activity  For 24 hours after the procedure, or as directed by your health care provider: ? Do not flex or bend the affected arm. ? Do not push or pull heavy objects with the affected arm. ? Do not drive  yourself home from the hospital or clinic. You may drive 24 hours after the procedure unless your health care provider tells you not to. ? Do not operate machinery or power tools.  Do not lift anything that is heavier than 10 lb (4.5 kg), or the limit that you are told, until your health care provider says that it is safe.  Ask your health care provider when it is okay to: ? Return to work or school. ? Resume usual physical activities or sports. ? Resume sexual activity.   General instructions  If the catheter site starts to bleed, raise your arm and put firm pressure on the site. If the bleeding does not stop, get help right away. This is a medical emergency.  If you went home on the same day as your procedure, a responsible adult should be with you for the first 24 hours after you arrive home.  Keep all follow-up visits as told by your health care provider. This is important. Contact a health care provider if:  You have a fever.  You have redness, swelling, or yellow drainage around your insertion site. Get help right away if:  You have unusual pain at the radial site.  The catheter insertion area swells very fast.  The insertion area is bleeding, and the bleeding does not stop when you hold steady pressure on the area.  Your arm or hand becomes pale, cool, tingly, or numb. These symptoms may represent a serious   problem that is an emergency. Do not wait to see if the symptoms will go away. Get medical help right away. Call your local emergency services (911 in the [place] Do not drive yourself to the hospital. Summary  After the procedure, it is common to have bruising and tenderness at the site.  Follow instructions from your health care provider about how to take care of your radial site wound. Check the wound every day for signs of infection.  Do not lift anything that is heavier than 10 lb (4.5 kg), or the limit that you are told, until your health care provider says that it  is safe. This information is not intended to replace advice given to you by your health care provider. Make sure you discuss any questions you have with your health care provider. Document Revised: 07/12/2017 Document Reviewed: 07/12/2017 Elsevier Patient Education  2021 [employer]  

## 2020-08-18 NOTE — Progress Notes (Signed)
Pt and Dellis Filbert instructed to call Dr Harriet Masson regarding Eliquis per Dr Irish Lack

## 2020-08-18 NOTE — Interval H&P Note (Signed)
Cath Lab Visit (complete for each Cath Lab visit)  Clinical Evaluation Leading to the Procedure:   ACS: No.  Non-ACS:    Anginal Classification: CCS III  Anti-ischemic medical therapy: Minimal Therapy (1 class of medications)  Non-Invasive Test Results: Intermediate-risk stress test findings: cardiac mortality 1-3%/year  Prior CABG: No previous CABG   Low EF by echo after recent stroke   History and Physical Interval Note:  08/18/2020 1:13 PM  Thomas Mckee  has presented today for surgery, with the diagnosis of cad.  The various methods of treatment have been discussed with the patient and family. After consideration of risks, benefits and other options for treatment, the patient has consented to  Procedure(s): LEFT HEART CATH AND CORONARY ANGIOGRAPHY (N/A) as a surgical intervention.  The patient's history has been reviewed, patient examined, no change in status, stable for surgery.  I have reviewed the patient's chart and labs.  Questions were answered to the patient's satisfaction.     Thomas Mckee

## 2020-08-19 ENCOUNTER — Encounter (HOSPITAL_COMMUNITY): Payer: Self-pay | Admitting: Interventional Cardiology

## 2020-08-21 ENCOUNTER — Encounter: Payer: Self-pay | Admitting: Surgery

## 2020-08-21 ENCOUNTER — Other Ambulatory Visit: Payer: Self-pay

## 2020-08-21 ENCOUNTER — Institutional Professional Consult (permissible substitution): Payer: HMO | Admitting: Surgery

## 2020-08-21 VITALS — BP 113/76 | HR 89 | Resp 20 | Ht 73.0 in

## 2020-08-21 DIAGNOSIS — I2510 Atherosclerotic heart disease of native coronary artery without angina pectoris: Secondary | ICD-10-CM | POA: Diagnosis not present

## 2020-08-21 NOTE — Progress Notes (Signed)
Cardiothoracic Surgery Consultation  PCP is Maryella Shivers, MD Referring Provider is Jettie Booze, MD  Chief Complaint  Patient presents with  . Consult    Initial surgical consult     HPI:  The patient is a 80 year old gentleman with history of hypertension, hyperlipidemia, diabetes, stage III chronic kidney disease, chronic pain secondary to 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  He tells me that he woke up in the morning and tried to turn on the TV but his hand was weak and he could not figure out how to turn the TV on.  He was also having some visual changes.  He went to the emergency department at [facility] and was diagnosed with a stroke.  Apparently CT showed a previous cerebellar stroke.  He was admitted there for further care.  A 2D echocardiogram reportedly showed a depressed ejection fraction with some wall motion abnormalities.  Electrocardiogram showed left bundle branch block.  He was apparently discharged and sent to see Dr. Harriet Masson for cardiology evaluation.  He waited some intermittent chest although he tells me that he is never had any chest pain or pressure and denies any history of shortness of breath.  He underwent cardiac catheterization by Dr. Irish Lack on 08/18/2020.  This showed severe multivessel coronary disease.  The LAD had 50% ostial to proximal stenosis followed by 75% proximal and mid LAD stenosis.  This area was diffusely calcified.  There is a small diagonal branch had 80% stenosis.  The left circumflex had mild nonobstructive disease.  The right coronary artery was a large dominant vessel that had 75% stenosis after the posterior descending branch 90% stenosis prior to a moderate size posterior lateral branch.  LVEDP was 17.  Given the long area of stenosis in the proximal LAD that was calcified and his history of diabetes it was felt that coronary bypass surgery may be the best treatment for  him and he was referred to me for evaluation.  The patient was also found to be in rate controlled atrial fibrillation when he was seen by Dr. Harriet Masson with no prior history.  He was started on Eliquis 5 mg twice a day due to his CHA2DS2-VASc score of 6.  He tells me today that he has not started taking the Eliquis since he was placed on aspirin and Plavix when he left the hospital and the pharmacy told him that he should not be taking Plavix with the Eliquis.   Past Medical History:  Diagnosis Date  . Carotid stenosis   . Cerebral arteriosclerosis with history of previous cerebrovascular accident   . Chronic kidney disease, stage 3 ([place])   . DDD (degenerative disc disease), lumbar 02/28/2019   Last Assessment & Plan:  Formatting of this note might be different from the original. See status post lumbar laminectomy plan  . Lumbar radiculopathy 02/28/2019   Last Assessment & Plan:  Formatting of this note might be different from the original. Continue 800 mg gabapentin t.i.d..  Unable to obtain Lyrica due to cost  . Neck pain 02/28/2019   Last Assessment & Plan:  Formatting of this note might be different from the original. Chronic neck pain with radiation to the bilateral shoulders, left greater than right.  He reports having plain films completed by chiropractor do not have access to these.  Will consider updating imaging for possible procedural consideration of persistent symptoms.  . Other chronic pain 02/28/2019  . Pain  management contract agreement 06/27/2019   Last Assessment & Plan:  Formatting of this note might be different from the original. Prior UDS results are consistent with regimen, [place] controlled substance registry is checked and is also consistent.  . S/P insertion of spinal cord stimulator 02/28/2019   Last Assessment & Plan:  Formatting of this note might be different from the original. He has established with [employer] rep who is helping him to make adjustments to his programming  accordingly.  He needs to follow-up with them as directed  . Status post lumbar laminectomy 02/28/2019   Last Assessment & Plan:  Formatting of this note is different from the original. 80 year old male with chronic low back and bilateral lower extremity radicular symptoms status post lumbar laminectomy with subsequent spinal cord stimulator placement. Recently updated plain films of the thoracic and lumbar spine show moderate lower lumbar facet arthrosis with indwellingThoracic stimulatorleadsce  . Systolic CHF [facility])     Past Surgical History:  Procedure Laterality Date  . BACK SURGERY    . LEFT HEART CATH AND CORONARY ANGIOGRAPHY N/A 08/18/2020   Procedure: LEFT HEART CATH AND CORONARY ANGIOGRAPHY;  Surgeon: Jettie Booze, MD;  Location: [place] [facility];  Service: Cardiovascular;  Laterality: N/A;  . REPLACEMENT TOTAL KNEE Right   . SHOULDER SURGERY      Family History  Problem Relation Age of Onset  . Diabetes Father   . Diabetes Sister   . Cancer Brother     Social History Social History   Tobacco Use  . Smoking status: Former Research scientist (life sciences)  . Smokeless tobacco: Never Used  Substance Use Topics  . Drug use: Never  He is divorced and lives alone. He has a daughter and 3 grandchildren. 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  Current Outpatient Medications  Medication Sig Dispense Refill  . aspirin EC 81 MG tablet Take 81 mg by mouth daily. Swallow whole.    . carvedilol (COREG) 3.125 MG tablet Take 3.125 mg by mouth 2 (two) times daily with a meal.    . clopidogrel (PLAVIX) 75 MG tablet Take 75 mg by mouth daily.    . DULoxetine (CYMBALTA) 30 MG capsule Take 30 mg by mouth daily.    . empagliflozin (JARDIANCE) 25 MG TABS tablet Take 25 mg by mouth daily.    Marland Kitchen gabapentin (NEURONTIN) 800 MG tablet Take 800 mg by mouth 3 (three) times daily.    Marland Kitchen  HYDROcodone-acetaminophen (NORCO/VICODIN) 5-325 MG tablet Take 1 tablet by mouth every 6 (six) hours as needed for moderate pain.    Marland Kitchen LANTUS SOLOSTAR 100 UNIT/ML Solostar Pen Inject 44 Units into the skin at bedtime.    Marland Kitchen lisinopril (ZESTRIL) 5 MG tablet Take 5 mg by mouth daily.    . metFORMIN (GLUCOPHAGE) 1000 MG tablet Take 1,000 mg by mouth 2 (two) times daily with a meal.    . oxymetazoline (NASAL SPRAY 12 HOUR) 0.05 % nasal spray Place 1 spray into both nostrils daily as needed for congestion.    Marland Kitchen OZEMPIC, 0.25 OR 0.5 MG/DOSE, 2 MG/1.5ML SOPN Inject 1 mg as directed once a week.    . simvastatin (ZOCOR) 80 MG tablet Take 80 mg by mouth at bedtime.     No current facility-administered medications for this visit.    Allergies  Allergen Reactions  . Morphine     Other reaction(s): Other (  see comments) Makes pt dizzy and lightheaded    Review of Systems  Constitutional: Negative for diaphoresis and fatigue.  HENT: Negative.   Eyes: Negative.   Respiratory: Negative for chest tightness and shortness of breath.   Cardiovascular: Negative for chest pain, palpitations and leg swelling.  Gastrointestinal: Negative.   Endocrine: Negative.   Genitourinary: Negative.   Musculoskeletal: Positive for arthralgias and back pain.  Skin: Negative.   Neurological: Negative for dizziness and syncope.  Hematological: Bruises/bleeds easily.  Psychiatric/Behavioral: Negative.     BP 113/76 (BP Location: Left Arm, Patient Position: Sitting)   Pulse 89   Resp 20   Ht 6\' 1"  (1.854 m)   SpO2 98% Comment: RA  BMI 30.08 kg/m  Physical Exam Constitutional:      Appearance: Normal appearance. He is normal weight.  HENT:     Head: Normocephalic and atraumatic.  Eyes:     Extraocular Movements: Extraocular movements intact.     Conjunctiva/sclera: Conjunctivae normal.     Pupils: Pupils are equal, round, and reactive to light.  Neck:     Vascular: No carotid bruit.  Cardiovascular:      Rate and Rhythm: Normal rate and regular rhythm.     Pulses: Normal pulses.     Heart sounds: Normal heart sounds. No murmur heard.   Pulmonary:     Effort: Pulmonary effort is normal.     Breath sounds: Normal breath sounds.  Abdominal:     General: Bowel sounds are normal. There is no distension.     Palpations: Abdomen is soft.     Tenderness: There is no abdominal tenderness.  Musculoskeletal:        General: No swelling. Normal range of motion.     Cervical back: Normal range of motion and neck supple.  Skin:    General: Skin is warm and dry.  Neurological:     General: No focal deficit present.     Mental Status: He is alert and oriented to person, place, and time.  Psychiatric:        Mood and Affect: Mood normal.        Behavior: Behavior normal.      Diagnostic Tests:  Physicians  Panel Physicians Referring Physician Case Authorizing Physician  Jettie Booze, MD (Primary)      Procedures  LEFT HEART CATH AND CORONARY ANGIOGRAPHY   Conclusion    Ost LAD to Prox LAD lesion is 50% stenosed.  1st Diag lesion is 80% stenosed.  Prox LAD to Mid LAD lesion is 75% stenosed.  RPAV-1 lesion is 75% stenosed.  RPAV-2 lesion is 90% stenosed.  Ramus lesion is 25% stenosed.  LV end diastolic pressure is mildly elevated.  There is no aortic valve stenosis.   Heavily calcified Ostial to mid LAD with severe obstruction in the mid vessel. Very long segment in the LAD would require atherectomy followed by multiple long, overlapping stents, which may be suboptimal treatment in a diabetic patient. Severe disease in the ostial diagonal.  Severe disease in the posterolateral artery.  Given new onset AFib, DM, and heavy calcium, would refer for surgical eval.  Maze and LAA occluder may be beneficial for him as well.   Of note, the patient asked me to call his ex-wife Carmell Austria with results.  I spoke to her and she was not interested in the results.  She only  asked if I was calling from [place], and I conveyed that he was in [place].  She asked  that we never call her again.  I did not mention the actual cath results to her.     Recommendations  Discharge Date In the absence of any other complications or medical issues, we expect the patient to be ready for discharge from a cath perspective on 08/18/2020.   Indications  Left ventricular dysfunction [I51.9 (ICD-10-CM)]   Procedural Details  Technical Details The risks, benefits, and details of the procedure were explained to the patient.  The patient verbalized understanding and wanted to proceed.  Informed written consent was obtained.  PROCEDURE TECHNIQUE:  After Xylocaine anesthesia a 34F slender sheath was placed in the right radial artery with a single anterior needle wall stick.   IV Heparin was given.  Left heart cath was done using a JR4 catheter.  Right coronary angiography was done using a Judkins R4 guide catheter.  Left coronary angiography was done using a Judkins L3.5 guide catheter.    A TR band was used for hemostasis.    Contrast: 60 cc  Estimated blood loss <50 mL.   During this procedure medications were administered to achieve and maintain moderate conscious sedation while the patient's heart rate, blood pressure, and oxygen saturation were continuously monitored and I was present face-to-face 100% of this time.   Medications (Filter: Administrations occurring from 1300 to 1358 on 08/18/20)  fentaNYL (SUBLIMAZE) injection (mcg) Total dose:  25 mcg  Date/Time Rate/Dose/Volume Action   08/18/20 1314 25 mcg Given    midazolam (VERSED) injection (mg) Total dose:  2 mg  Date/Time Rate/Dose/Volume Action   08/18/20 1315 2 mg Given    Heparin (Porcine) in NaCl 1000-0.9 UT/500ML-% SOLN (mL) Total volume:  1,000 mL  Date/Time Rate/Dose/Volume Action   08/18/20 1315 500 mL Given   1315 500 mL Given    lidocaine (PF) (XYLOCAINE) 1 % injection (mL) Total volume:  2  mL  Date/Time Rate/Dose/Volume Action   08/18/20 1322 2 mL Given    Radial Cocktail/Verapamil only (mL) Total volume:  10 mL  Date/Time Rate/Dose/Volume Action   08/18/20 1324 10 mL Given    heparin sodium (porcine) injection (Units) Total dose:  5,000 Units  Date/Time Rate/Dose/Volume Action   08/18/20 1328 5,000 Units Given    iohexol (OMNIPAQUE) 350 MG/ML injection (mL) Total volume:  60 mL  Date/Time Rate/Dose/Volume Action   08/18/20 1350 60 mL Given     Sedation Time  Sedation Time Physician-1: 27 minutes 47 seconds   Contrast  Medication Name Total Dose  iohexol (OMNIPAQUE) 350 MG/ML injection 60 mL    Radiation/Fluoro  Fluoro time: 1.9 (min) DAP: 13315 (mGycm2) Cumulative Air Kerma: 425 (mGy)   Complications   Complications documented before study signed (08/18/2020 9:56 PM)    No complications were associated with this study.  Documented by Jettie Booze, MD - 08/18/2020 2:08 PM     Coronary Findings   Diagnostic Dominance: Right  Left Anterior Descending  Ost LAD to Prox LAD lesion is 50% stenosed.  Prox LAD to Mid LAD lesion is 75% stenosed. The lesion is severely calcified.  First Diagonal Branch  Vessel is small in size.  1st Diag lesion is 80% stenosed.  Ramus Intermedius  Vessel is large.  Ramus lesion is 25% stenosed.  Left Circumflex  Vessel is small.  Right Coronary Artery  The vessel exhibits minimal luminal irregularities.  Right Posterior Atrioventricular Artery  RPAV-1 lesion is 75% stenosed.  RPAV-2 lesion is 90% stenosed.   Intervention   No interventions have  been documented.  Left Heart  Left Ventricle LV end diastolic pressure is mildly elevated.  Aortic Valve There is no aortic valve stenosis.   Coronary Diagrams   Diagnostic Dominance: Right    Intervention    Implants    No implant documentation for this case.   Syngo Images  Show images for CARDIAC CATHETERIZATION  Images on  Long Term Storage  Show images for [place], [place] to Procedure Log  Procedure Log     Hemo Data  Flowsheet Row Most Recent Value  AO Systolic Pressure 761 mmHg  AO Diastolic Pressure 71 mmHg  AO Mean 98 mmHg  LV Systolic Pressure 607 mmHg  LV Diastolic Pressure 12 mmHg  LV EDP 17 mmHg  AOp Systolic Pressure 371 mmHg  AOp Diastolic Pressure 71 mmHg  AOp Mean Pressure 99 mmHg  LVp Systolic Pressure 062 mmHg  LVp Diastolic Pressure 12 mmHg  LVp EDP Pressure 16 mmHg     Impression:  This 80 year old gentleman has severe multivessel coronary disease with a long calcified high-grade stenosis in the proximal to mid LAD as well as a high-grade distal right coronary artery stenosis compromising a moderate sized posterior lateral branch.  By report he has reduced left ventricular ejection fraction with wall motion abnormalities but that echo was done at [place] and I do not have access to that.  He denies any symptoms whatsoever but with diabetes and a reduced ejection fraction he could be having silent ischemia.  He is not felt to be a good PCI candidate given the length and calcification of the stenosis in the proximal to mid LAD.  I think his vessels are reasonable for coronary bypass graft surgery and that may prevent further left ventricular deterioration.  He will require a 2D echocardiogram in our system before surgery.  He has rate controlled atrial fibrillation with left bundle branch block morphology.  Given his age and increased operative risk and the fact that his atrial fibrillation is rate controlled and he is asymptomatic I do not think a Maze procedure is indicated.  I would plan to place a clip on his left atrial appendage.  He has an appointment with Dr. Fortunato Curling on September 15, 2020 with a carotid ultrasound.  This was scheduled through [facility] and I am not sure if they saw something on an ultrasound in the hospital.  The patient does not know.  It is certainly  possible that his stroke could be related to atrial fibrillation on no anticoagulation.  I will plan to wait till after that appointment before scheduling coronary bypass surgery.  I did instruct him to begin taking his Eliquis and to stop the Plavix.  He will continue aspirin 81 mg daily.  Plan:  He will call us to schedule coronary bypass graft surgery and clipping of left atrial appendage after he sees Dr. Fortunato Curling on 09/15/2020 for work-up of possible carotid artery disease and recent stroke.   I spent 60 minutes performing this consultation and > 50% of this time was spent face to face counseling and coordinating the care of this patient's severe multivessel coronary disease.   Gaye Pollack, MD [facility] [phone]

## 2020-08-24 DIAGNOSIS — E1129 Type 2 diabetes mellitus with other diabetic kidney complication: Secondary | ICD-10-CM | POA: Diagnosis not present

## 2020-08-24 DIAGNOSIS — E1165 Type 2 diabetes mellitus with hyperglycemia: Secondary | ICD-10-CM | POA: Diagnosis not present

## 2020-08-24 DIAGNOSIS — N1831 Chronic kidney disease, stage 3a: Secondary | ICD-10-CM | POA: Diagnosis not present

## 2020-08-24 DIAGNOSIS — E785 Hyperlipidemia, unspecified: Secondary | ICD-10-CM | POA: Diagnosis not present

## 2020-08-25 DIAGNOSIS — M5116 Intervertebral disc disorders with radiculopathy, lumbar region: Secondary | ICD-10-CM | POA: Diagnosis not present

## 2020-08-25 DIAGNOSIS — Z760 Encounter for issue of repeat prescription: Secondary | ICD-10-CM | POA: Diagnosis not present

## 2020-08-25 DIAGNOSIS — Z79891 Long term (current) use of opiate analgesic: Secondary | ICD-10-CM | POA: Diagnosis not present

## 2020-08-25 DIAGNOSIS — Z9889 Other specified postprocedural states: Secondary | ICD-10-CM | POA: Diagnosis not present

## 2020-08-25 DIAGNOSIS — G8929 Other chronic pain: Secondary | ICD-10-CM | POA: Diagnosis not present

## 2020-08-25 DIAGNOSIS — Z9682 Presence of neurostimulator: Secondary | ICD-10-CM | POA: Diagnosis not present

## 2020-08-25 DIAGNOSIS — Z79899 Other long term (current) drug therapy: Secondary | ICD-10-CM | POA: Diagnosis not present

## 2020-08-26 ENCOUNTER — Telehealth: Payer: Self-pay

## 2020-08-26 NOTE — Telephone Encounter (Signed)
Confirmed with lab that we can do a carotid u/s with a back stimulator. Advised patient - he will bring controller to his 3/29 appt.

## 2020-08-31 ENCOUNTER — Other Ambulatory Visit: Payer: Self-pay

## 2020-09-03 ENCOUNTER — Ambulatory Visit (INDEPENDENT_AMBULATORY_CARE_PROVIDER_SITE_OTHER): Payer: HMO | Admitting: Cardiology

## 2020-09-03 ENCOUNTER — Other Ambulatory Visit: Payer: Self-pay

## 2020-09-03 ENCOUNTER — Encounter: Payer: Self-pay | Admitting: Cardiology

## 2020-09-03 VITALS — BP 112/62 | HR 68 | Ht 73.0 in | Wt 230.0 lb

## 2020-09-03 DIAGNOSIS — I447 Left bundle-branch block, unspecified: Secondary | ICD-10-CM

## 2020-09-03 DIAGNOSIS — I4819 Other persistent atrial fibrillation: Secondary | ICD-10-CM | POA: Diagnosis not present

## 2020-09-03 DIAGNOSIS — I2510 Atherosclerotic heart disease of native coronary artery without angina pectoris: Secondary | ICD-10-CM

## 2020-09-03 DIAGNOSIS — I5020 Unspecified systolic (congestive) heart failure: Secondary | ICD-10-CM | POA: Diagnosis not present

## 2020-09-03 DIAGNOSIS — E118 Type 2 diabetes mellitus with unspecified complications: Secondary | ICD-10-CM

## 2020-09-03 DIAGNOSIS — Z794 Long term (current) use of insulin: Secondary | ICD-10-CM | POA: Diagnosis not present

## 2020-09-03 DIAGNOSIS — Z8673 Personal history of transient ischemic attack (TIA), and cerebral infarction without residual deficits: Secondary | ICD-10-CM | POA: Diagnosis not present

## 2020-09-03 DIAGNOSIS — I672 Cerebral atherosclerosis: Secondary | ICD-10-CM | POA: Diagnosis not present

## 2020-09-03 DIAGNOSIS — I5022 Chronic systolic (congestive) heart failure: Secondary | ICD-10-CM | POA: Diagnosis not present

## 2020-09-03 DIAGNOSIS — E782 Mixed hyperlipidemia: Secondary | ICD-10-CM | POA: Diagnosis not present

## 2020-09-03 LAB — MAGNESIUM: Magnesium: 1.9 mg/dL (ref 1.6–2.3)

## 2020-09-03 LAB — CBC
Hematocrit: 44.8 % (ref 37.5–51.0)
Hemoglobin: 15.1 g/dL (ref 13.0–17.7)
MCH: 29.7 pg (ref 26.6–33.0)
MCHC: 33.7 g/dL (ref 31.5–35.7)
MCV: 88 fL (ref 79–97)
Platelets: 250 10*3/uL (ref 150–450)
RBC: 5.09 x10E6/uL (ref 4.14–5.80)
RDW: 14.1 % (ref 11.6–15.4)
WBC: 7.5 10*3/uL (ref 3.4–10.8)

## 2020-09-03 LAB — BASIC METABOLIC PANEL
BUN/Creatinine Ratio: 20 (ref 10–24)
BUN: 22 mg/dL (ref 8–27)
CO2: 22 mmol/L (ref 20–29)
Calcium: 10 mg/dL (ref 8.6–10.2)
Chloride: 100 mmol/L (ref 96–106)
Creatinine, Ser: 1.1 mg/dL (ref 0.76–1.27)
Glucose: 160 mg/dL — ABNORMAL HIGH (ref 65–99)
Potassium: 5.1 mmol/L (ref 3.5–5.2)
Sodium: 139 mmol/L (ref 134–144)
eGFR: 69 mL/min/{1.73_m2} (ref >59)

## 2020-09-03 NOTE — Progress Notes (Signed)
Cardiology Office Note:    Date:  09/03/2020   ID:  Thomas Mckee, DOB Nov 25, 1940, MRN [id number]  PCP:  Thomas Shivers, MD  Cardiologist:  Thomas Salines, DO  Electrophysiologist:  None   Referring MD: Thomas Shivers, MD   Chief Complaint  Patient presents with  . Follow-up   History of Present Illness:    Thomas Mckee is a 80 y.o. male with a hx of multivessel coronary artery disease seen on left heart catheterization recently, atrial fibrillation now on Eliquis hypertension, hyperlipidemia, diabetes, stage III chronic kidney disease, chronic pain secondary to 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  I saw the patient on August 12, 2021 at that time due to his abnormal echocardiogram which was suggesting lesion in the LAD territory and with his left bundle branch block I recommended a heart catheterization.  In addition he was found to have new onset A. fib which has started anticoagulation due to his chads Vasc score of 6.  Recommended he undergo left heart catheterization which was done on August 18, 2020 which showed multivessel disease with   The LAD had 50% ostial to proximal stenosis followed by 75% proximal and mid LAD stenosis.  This area was diffusely calcified.  There is a small diagonal branch had 80% stenosis.  The left circumflex had mild nonobstructive disease.  The right coronary artery was a large dominant vessel that had 75% stenosis after the posterior descending branch 90% stenosis prior to a moderate size posterior lateral branch.  LVEDP was 17.  Given the long area of stenosis in the proximal LAD that was calcified and his history of diabetes it was felt that coronary bypass surgery.  The patient did see cardiothoracic surgery in consultation.  And was also advised to see vascular surgery due to his reported cardiac artery disease and recent stroke as well.  He tells me he transiently stopped his Eliquis as  he was advised by pharmacy to stop this medication but he has restarted it since his visit with cardiothoracic surgery.  Past Medical History:  Diagnosis Date  . Abnormal echocardiogram 08/13/2020  . Abnormal ventricular wall motion 08/13/2020  . Atrial fibrillation ([place]) 08/13/2020  . Carotid stenosis   . Cerebral arteriosclerosis with history of previous cerebrovascular accident   . Chronic kidney disease, stage 3 ([place])   . DDD (degenerative disc disease), lumbar 02/28/2019   Last Assessment & Plan:  Formatting of this note might be different from the original. See status post lumbar laminectomy plan  . HFrEF (heart failure with reduced ejection fraction) ([place]) 08/13/2020  . History of CVA (cerebrovascular accident) 08/13/2020  . LBBB (left bundle branch block) 08/13/2020  . Left ventricular dysfunction   . Lumbar radiculopathy 02/28/2019   Last Assessment & Plan:  Formatting of this note might be different from the original. Continue 800 mg gabapentin t.i.d..  Unable to obtain Lyrica due to cost  . Mixed hyperlipidemia 08/13/2020  . Neck pain 02/28/2019   Last Assessment & Plan:  Formatting of this note might be different from the original. Chronic neck pain with radiation to the bilateral shoulders, left greater than right.  He reports having plain films completed by chiropractor do not have access to these.  Will consider updating imaging for possible procedural consideration of persistent symptoms.  . Other chronic pain 02/28/2019  . Pain management contract agreement 06/27/2019   Last Assessment & Plan:  Formatting of this note might be different  from the original. Prior UDS results are consistent with regimen, [place] controlled substance registry is checked and is also consistent.  . Primary hypertension 08/13/2020  . S/P insertion of spinal cord stimulator 02/28/2019   Last Assessment & Plan:  Formatting of this note might be different from the original. He has established with [employer] rep  who is helping him to make adjustments to his programming accordingly.  He needs to follow-up with them as directed  . Status post lumbar laminectomy 02/28/2019   Last Assessment & Plan:  Formatting of this note is different from the original. 80 year old male with chronic low back and bilateral lower extremity radicular symptoms status post lumbar laminectomy with subsequent spinal cord stimulator placement. Recently updated plain films of the thoracic and lumbar spine show moderate lower lumbar facet arthrosis with indwellingThoracic stimulatorleadsce  . Systolic CHF ([place])   . Type 2 diabetes mellitus with complication, with long-term current use of insulin ([place]) 08/13/2020    Past Surgical History:  Procedure Laterality Date  . BACK SURGERY    . LEFT HEART CATH AND CORONARY ANGIOGRAPHY N/A 08/18/2020   Procedure: LEFT HEART CATH AND CORONARY ANGIOGRAPHY;  Surgeon: Jettie Booze, MD;  Location: [facility];  Service: Cardiovascular;  Laterality: N/A;  . REPLACEMENT TOTAL KNEE Right   . SHOULDER SURGERY      Current Medications: Current Meds  Medication Sig  . apixaban (ELIQUIS) 5 MG TABS tablet Take 5 mg by mouth in the morning and at bedtime.  Marland Kitchen aspirin EC 81 MG tablet Take 81 mg by mouth daily. Swallow whole.  . carvedilol (COREG) 3.125 MG tablet Take 3.125 mg by mouth 2 (two) times daily with a meal.  . clopidogrel (PLAVIX) 75 MG tablet Take 75 mg by mouth daily.  . DULoxetine (CYMBALTA) 30 MG capsule Take 30 mg by mouth daily.  . empagliflozin (JARDIANCE) 25 MG TABS tablet Take 25 mg by mouth daily.  Marland Kitchen gabapentin (NEURONTIN) 800 MG tablet Take 800 mg by mouth 3 (three) times daily.  Marland Kitchen HYDROcodone-acetaminophen (NORCO/VICODIN) 5-325 MG tablet Take 1 tablet by mouth every 6 (six) hours as needed for moderate pain.  Marland Kitchen LANTUS SOLOSTAR 100 UNIT/ML Solostar Pen Inject 44 Units into the skin at bedtime.  Marland Kitchen lisinopril (ZESTRIL) 5 MG tablet Take 5 mg by mouth daily.  .  metFORMIN (GLUCOPHAGE) 1000 MG tablet Take 1,000 mg by mouth 2 (two) times daily with a meal.  . naproxen (NAPROSYN) 500 MG tablet Take 500 mg by mouth 2 (two) times daily.  Marland Kitchen oxymetazoline (NASAL SPRAY 12 HOUR) 0.05 % nasal spray Place 1 spray into both nostrils daily as needed for congestion.  Marland Kitchen OZEMPIC, 0.25 OR 0.5 MG/DOSE, 2 MG/1.5ML SOPN Inject 1 mg as directed once a week.  . simvastatin (ZOCOR) 80 MG tablet Take 80 mg by mouth at bedtime.     Allergies:   Morphine   Social History   Socioeconomic History  . Marital status: Divorced    Spouse name: Not on file  . Number of children: Not on file  . Years of education: Not on file  . Highest education level: Not on file  Occupational History  . Not on file  Tobacco Use  . Smoking status: Former Research scientist (life sciences)  . Smokeless tobacco: Never Used  Substance and Sexual Activity  . Alcohol use: Not on file  . Drug use: Never  . Sexual activity: Not on file  Other Topics Concern  . Not on file  Social History Narrative  . Not on file   Social Determinants of Health   Financial Resource Strain: Not on file  Food Insecurity: Not on file  Transportation Needs: Not on file  Physical Activity: Not on file  Stress: Not on file  Social Connections: Not on file     Family History: The patient's family history includes Cancer in his brother; Diabetes in his father and sister.  ROS:   Review of Systems  Constitution: Negative for decreased appetite, fever and weight gain.  HENT: Negative for congestion, ear discharge, hoarse voice and sore throat.   Eyes: Negative for discharge, redness, vision loss in right eye and visual halos.  Cardiovascular: Negative for chest pain, dyspnea on exertion, leg swelling, orthopnea and palpitations.  Respiratory: Negative for cough, hemoptysis, shortness of breath and snoring.   Endocrine: Negative for heat intolerance and polyphagia.  Hematologic/Lymphatic: Negative for bleeding problem. Does not  bruise/bleed easily.  Skin: Negative for flushing, nail changes, rash and suspicious lesions.  Musculoskeletal: Negative for arthritis, joint pain, muscle cramps, myalgias, neck pain and stiffness.  Gastrointestinal: Negative for abdominal pain, bowel incontinence, diarrhea and excessive appetite.  Genitourinary: Negative for decreased libido, genital sores and incomplete emptying.  Neurological: Negative for brief paralysis, focal weakness, headaches and loss of balance.  Psychiatric/Behavioral: Negative for altered mental status, depression and suicidal ideas.  Allergic/Immunologic: Negative for HIV exposure and persistent infections.    EKGs/Labs/Other Studies Reviewed:    The following studies were reviewed today:   EKG: None today  Echocardiogram done on February 30 2022 reports moderately impaired LV function 35 to 40%.  Basal inferoseptal LV wall motion is hypokinetic.  Mid anterior LV wall motion is hypokinetic.  Mid anteroseptal LV wall motion is hypokinetic.  Apical septal LV wall motion hypokinetic.  There is paradoxical motion of the ventricular septum which is consistent with left bundle branch block.  Left atrium is mildly dilated.  Moderate aortic valve sclerosis.   Left heart catheterization   Ost LAD to Prox LAD lesion is 50% stenosed.  1st Diag lesion is 80% stenosed.  Prox LAD to Mid LAD lesion is 75% stenosed.  RPAV-1 lesion is 75% stenosed.  RPAV-2 lesion is 90% stenosed.  Ramus lesion is 25% stenosed.  LV end diastolic pressure is mildly elevated.  There is no aortic valve stenosis.   Heavily calcified Ostial to mid LAD with severe obstruction in the mid vessel. Very long segment in the LAD would require atherectomy followed by multiple long, overlapping stents, which may be suboptimal treatment in a diabetic patient. Severe disease in the ostial diagonal.  Severe disease in the posterolateral artery.  Recent Labs: 08/12/2020: BUN 32; Creatinine, Ser  1.45; Hemoglobin 15.1; Magnesium 1.6; Platelets 235; Potassium 5.2; Sodium 140  Recent Lipid Panel No results found for: CHOL, TRIG, HDL, CHOLHDL, VLDL, LDLCALC, LDLDIRECT  Physical Exam:    VS:  BP 112/62 (BP Location: Left Arm, Patient Position: Sitting)   Pulse 68   Ht 6\' 1"  (1.854 m)   Wt 230 lb (104.3 kg)   SpO2 95%   BMI 30.34 kg/m     Wt Readings from Last 3 Encounters:  09/03/20 230 lb (104.3 kg)  08/18/20 228 lb (103.4 kg)  08/12/20 230 lb 3.2 oz (104.4 kg)     GEN: Well nourished, well developed in no acute distress HEENT: Normal NECK: No JVD; No carotid bruits LYMPHATICS: No lymphadenopathy CARDIAC: S1S2 noted,RRR, no murmurs, rubs, gallops RESPIRATORY:  Clear to auscultation without  rales, wheezing or rhonchi  ABDOMEN: Soft, non-tender, non-distended, +bowel sounds, no guarding. EXTREMITIES: No edema, No cyanosis, no clubbing MUSCULOSKELETAL:  No deformity  SKIN: Warm and dry NEUROLOGIC:  Alert and oriented x 3, non-focal PSYCHIATRIC:  Normal affect, good insight  ASSESSMENT:    1. Coronary artery disease involving native coronary artery of native heart, unspecified whether angina present   2. Persistent atrial fibrillation (HCC)   3. Cerebral arteriosclerosis with history of previous cerebrovascular accident   4. Chronic systolic congestive heart failure (HCC)   5. HFrEF (heart failure with reduced ejection fraction) ([place])   6. LBBB (left bundle branch block)   7. Type 2 diabetes mellitus with complication, with long-term current use of insulin ([place])   8. Mixed hyperlipidemia    PLAN:     Coronary artery disease-he has been seen by cardiothoracic surgery for surgical treatment.  Plan is coronary artery bypass grafting with clipping of the left atrial appendix.  But the scheduling of his procedure is pending until after he sees vascular surgery.  For now we will continue patient on his antiplatelet therapy as well as his statins.  For atrial fibrillation  he is on beta-blocker we will continue that as well as his apixaban 5 mg twice a day.  In terms of his ischemic cardiomyopathy he will remain on the lisinopril, carvedilol for now.  My plan is to transition the patient eventually to [facility].  But I am waiting to after his surgery.  This is being managed by his primary care doctor.  No adjustments for antidiabetic medications were made today.  Hyperlipidemia - continue with current statin medication.  The patient is in agreement with the above plan. The patient left the office in stable condition.  The patient will follow up in 3 months or sooner if needed.   Medication Adjustments/Labs and Tests Ordered: Current medicines are reviewed at length with the patient today.  Concerns regarding medicines are outlined above.  Orders Placed This Encounter  Procedures  . Basic metabolic panel  . Magnesium  . CBC   No orders of the defined types were placed in this encounter.   Patient Instructions  Medication Instructions:  Your physician recommends that you continue on your current medications as directed. Please refer to the Current Medication list given to you today.  *If you need a refill on your cardiac medications before your next appointment, please call your pharmacy*   Lab Work: Your physician recommends that you return for lab work today: bmp, mg, cbc   If you have labs (blood work) drawn today and your tests are completely normal, you will receive your results only by: Marland Kitchen MyChart Message (if you have MyChart) OR . A paper copy in the mail If you have any lab test that is abnormal or we need to change your treatment, we will call you to review the results.   Testing/Procedures: None   Follow-Up: At [facility], you and your health needs are our priority.  As part of our continuing mission to provide you with exceptional heart care, we have created designated Provider Care Teams.  These Care Teams include your primary  Cardiologist (physician) and Advanced Practice Providers (APPs -  Physician Assistants and Nurse Practitioners) who all work together to provide you with the care you need, when you need it.  We recommend signing up for the patient portal called "MyChart".  Sign up information is provided on this After Visit Summary.  MyChart is used to connect  with patients for Virtual Visits (Telemedicine).  Patients are able to view lab/test results, encounter notes, upcoming appointments, etc.  Non-urgent messages can be sent to your provider as well.   To learn more about what you can do with MyChart, go to [email or web address].    Your next appointment:   3 month(s)  The format for your next appointment:   In Person  Provider:   Berniece Salines, DO   Other Instructions      Adopting a Healthy Lifestyle.  Know what a healthy weight is for you (roughly BMI <25) and aim to maintain this   Aim for 7+ servings of fruits and vegetables daily   65-80+ fluid ounces of water or unsweet tea for healthy kidneys   Limit to max 1 drink of alcohol per day; avoid smoking/tobacco   Limit animal fats in diet for cholesterol and heart health - choose grass fed whenever available   Avoid highly processed foods, and foods high in saturated/trans fats   Aim for low stress - take time to unwind and care for your mental health   Aim for 150 min of moderate intensity exercise weekly for heart health, and weights twice weekly for bone health   Aim for 7-9 hours of sleep daily   When it comes to diets, agreement about the perfect plan isnt easy to find, even among the experts. Experts at the [employer] developed an idea known as the Healthy Eating Plate. Just imagine a plate divided into logical, healthy portions.   The emphasis is on diet quality:   Load up on vegetables and fruits - one-half of your plate: Aim for color and variety, and remember that potatoes dont count.   Go for  whole grains - one-quarter of your plate: Whole wheat, barley, wheat berries, quinoa, oats, brown rice, and foods made with them. If you want pasta, go with whole wheat pasta.   Protein power - one-quarter of your plate: Fish, chicken, beans, and nuts are all healthy, versatile protein sources. Limit red meat.   The diet, however, does go beyond the plate, offering a few other suggestions.   Use healthy plant oils, such as olive, canola, soy, corn, sunflower and peanut. Check the labels, and avoid partially hydrogenated oil, which have unhealthy trans fats.   If youre thirsty, drink water. Coffee and tea are good in moderation, but skip sugary drinks and limit milk and dairy products to one or two daily servings.   The type of carbohydrate in the diet is more important than the amount. Some sources of carbohydrates, such as vegetables, fruits, whole grains, and beans-are healthier than others.   Finally, stay active  Signed, Thomas Salines, DO  09/03/2020 1:49 PM    [facility]

## 2020-09-03 NOTE — Patient Instructions (Signed)
Medication Instructions:  Your physician recommends that you continue on your current medications as directed. Please refer to the Current Medication list given to you today.  *If you need a refill on your cardiac medications before your next appointment, please call your pharmacy*   Lab Work: Your physician recommends that you return for lab work today: bmp, mg, cbc   If you have labs (blood work) drawn today and your tests are completely normal, you will receive your results only by: Marland Kitchen MyChart Message (if you have MyChart) OR . A paper copy in the mail If you have any lab test that is abnormal or we need to change your treatment, we will call you to review the results.   Testing/Procedures: None   Follow-Up: At [facility], you and your health needs are our priority.  As part of our continuing mission to provide you with exceptional heart care, we have created designated Provider Care Teams.  These Care Teams include your primary Cardiologist (physician) and Advanced Practice Providers (APPs -  Physician Assistants and Nurse Practitioners) who all work together to provide you with the care you need, when you need it.  We recommend signing up for the patient portal called "MyChart".  Sign up information is provided on this After Visit Summary.  MyChart is used to connect with patients for Virtual Visits (Telemedicine).  Patients are able to view lab/test results, encounter notes, upcoming appointments, etc.  Non-urgent messages can be sent to your provider as well.   To learn more about what you can do with MyChart, go to [email or web address].    Your next appointment:   3 month(s)  The format for your next appointment:   In Person  Provider:   Berniece Salines, DO   Other Instructions

## 2020-09-04 ENCOUNTER — Telehealth: Payer: Self-pay

## 2020-09-04 NOTE — Telephone Encounter (Signed)
Patient notified of test results 

## 2020-09-04 NOTE — Telephone Encounter (Signed)
-----   Message from Berniece Salines, DO sent at 09/03/2020  5:09 PM EDT ----- Labs normal

## 2020-09-08 ENCOUNTER — Other Ambulatory Visit: Payer: Self-pay

## 2020-09-08 ENCOUNTER — Ambulatory Visit: Payer: HMO | Admitting: Vascular Surgery

## 2020-09-08 ENCOUNTER — Encounter (HOSPITAL_COMMUNITY): Payer: HMO

## 2020-09-08 ENCOUNTER — Encounter: Payer: Self-pay | Admitting: Vascular Surgery

## 2020-09-08 VITALS — BP 116/76 | HR 95 | Temp 97.3°F | Resp 18 | Ht 73.0 in | Wt 232.0 lb

## 2020-09-08 DIAGNOSIS — I6523 Occlusion and stenosis of bilateral carotid arteries: Secondary | ICD-10-CM

## 2020-09-08 NOTE — Progress Notes (Signed)
Patient name: Thomas Mckee MRN: [id number] DOB: 01/03/1941 Sex: male  REASON FOR CONSULT: Evaluate carotid artery stenosis  HPI: Thomas Mckee is a 80 y.o. male, with history of atrial fibrillation, stage III CKD, hypertension, hyperlipidemia, CHF that presents for evaluation of carotid stenosis.  Patient states that back in the beginning of February he was at home when he was trying to call his brother and had difficulty with completing the task and could not remember.  Apparently had some slurred speech associated with the event.  He denies any focal weakness in arm or leg.  No vision loss.  Ultimately was taken to [facility].  CTA neck there showed mixed density plaque in the right carotid bifurcation with greater than 80% stenosis.  He states he cannot do an MRI because he had spinal stimulator.  He is now on Eliquis for his A. fib.  He is currently scheduled for coronary artery bypass grafting and clipping of his left atrial appendage after evaluation by Dr. Cyndia Bent for multivessel coronary disease.  He denies any active chest pain or shortness of breath at this time.  He has had no additional focal neurologic events since this event last month.  No head or neck radiation or surgery.   Past Medical History:  Diagnosis Date  . Abnormal echocardiogram 08/13/2020  . Abnormal ventricular wall motion 08/13/2020  . Atrial fibrillation ([place]) 08/13/2020  . Carotid stenosis   . Cerebral arteriosclerosis with history of previous cerebrovascular accident   . Chronic kidney disease, stage 3 ([place])   . DDD (degenerative disc disease), lumbar 02/28/2019   Last Assessment & Plan:  Formatting of this note might be different from the original. See status post lumbar laminectomy plan  . HFrEF (heart failure with reduced ejection fraction) ([place]) 08/13/2020  . History of CVA (cerebrovascular accident) 08/13/2020  . LBBB (left bundle branch block) 08/13/2020  . Left ventricular dysfunction   . Lumbar  radiculopathy 02/28/2019   Last Assessment & Plan:  Formatting of this note might be different from the original. Continue 800 mg gabapentin t.i.d..  Unable to obtain Lyrica due to cost  . Mixed hyperlipidemia 08/13/2020  . Neck pain 02/28/2019   Last Assessment & Plan:  Formatting of this note might be different from the original. Chronic neck pain with radiation to the bilateral shoulders, left greater than right.  He reports having plain films completed by chiropractor do not have access to these.  Will consider updating imaging for possible procedural consideration of persistent symptoms.  . Other chronic pain 02/28/2019  . Pain management contract agreement 06/27/2019   Last Assessment & Plan:  Formatting of this note might be different from the original. Prior UDS results are consistent with regimen, [place] controlled substance registry is checked and is also consistent.  . Primary hypertension 08/13/2020  . S/P insertion of spinal cord stimulator 02/28/2019   Last Assessment & Plan:  Formatting of this note might be different from the original. He has established with [employer] rep who is helping him to make adjustments to his programming accordingly.  He needs to follow-up with them as directed  . Status post lumbar laminectomy 02/28/2019   Last Assessment & Plan:  Formatting of this note is different from the original. 80 year old male with chronic low back and bilateral lower extremity radicular symptoms status post lumbar laminectomy with subsequent spinal cord stimulator placement. Recently updated plain films of the thoracic and lumbar spine show moderate lower lumbar facet arthrosis with indwellingThoracic  stimulatorleadsce  . Systolic CHF ([place])   . Type 2 diabetes mellitus with complication, with long-term current use of insulin ([place]) 08/13/2020    Past Surgical History:  Procedure Laterality Date  . BACK SURGERY    . LEFT HEART CATH AND CORONARY ANGIOGRAPHY N/A 08/18/2020    Procedure: LEFT HEART CATH AND CORONARY ANGIOGRAPHY;  Surgeon: Jettie Booze, MD;  Location: [facility];  Service: Cardiovascular;  Laterality: N/A;  . REPLACEMENT TOTAL KNEE Right   . SHOULDER SURGERY      Family History  Problem Relation Age of Onset  . Diabetes Father   . Diabetes Sister   . Cancer Brother     SOCIAL HISTORY: Social History   Socioeconomic History  . Marital status: Divorced    Spouse name: Not on file  . Number of children: Not on file  . Years of education: Not on file  . Highest education level: Not on file  Occupational History  . Not on file  Tobacco Use  . Smoking status: Former Research scientist (life sciences)  . Smokeless tobacco: Never Used  Substance and Sexual Activity  . Alcohol use: Not on file  . Drug use: Never  . Sexual activity: Not on file  Other Topics Concern  . Not on file  Social History Narrative  . Not on file   Social Determinants of Health   Financial Resource Strain: Not on file  Food Insecurity: Not on file  Transportation Needs: Not on file  Physical Activity: Not on file  Stress: Not on file  Social Connections: Not on file  Intimate Partner Violence: Not on file    Allergies  Allergen Reactions  . Morphine Other (See Comments)    Other reaction(s): Other (see comments) Makes pt dizzy and lightheaded Makes pt dizzy and lightheaded Other reaction(s): Other (see comments) Makes pt dizzy and lightheaded    Current Outpatient Medications  Medication Sig Dispense Refill  . apixaban (ELIQUIS) 5 MG TABS tablet Take 5 mg by mouth in the morning and at bedtime.    Marland Kitchen aspirin EC 81 MG tablet Take 81 mg by mouth daily. Swallow whole.    . carvedilol (COREG) 3.125 MG tablet Take 3.125 mg by mouth 2 (two) times daily with a meal.    . clopidogrel (PLAVIX) 75 MG tablet Take 75 mg by mouth daily.    . DULoxetine (CYMBALTA) 30 MG capsule Take 30 mg by mouth daily.    . empagliflozin (JARDIANCE) 25 MG TABS tablet Take 25 mg by mouth  daily.    Marland Kitchen gabapentin (NEURONTIN) 800 MG tablet Take 800 mg by mouth 3 (three) times daily.    Marland Kitchen HYDROcodone-acetaminophen (NORCO/VICODIN) 5-325 MG tablet Take 1 tablet by mouth every 6 (six) hours as needed for moderate pain.    Marland Kitchen LANTUS SOLOSTAR 100 UNIT/ML Solostar Pen Inject 44 Units into the skin at bedtime.    Marland Kitchen lisinopril (ZESTRIL) 5 MG tablet Take 5 mg by mouth daily.    . metFORMIN (GLUCOPHAGE) 1000 MG tablet Take 1,000 mg by mouth 2 (two) times daily with a meal.    . oxymetazoline (NASAL SPRAY 12 HOUR) 0.05 % nasal spray Place 1 spray into both nostrils daily as needed for congestion.    Marland Kitchen OZEMPIC, 0.25 OR 0.5 MG/DOSE, 2 MG/1.5ML SOPN Inject 1 mg as directed once a week.    . simvastatin (ZOCOR) 80 MG tablet Take 80 mg by mouth at bedtime.    . naproxen (NAPROSYN) 500 MG tablet Take  500 mg by mouth 2 (two) times daily. (Patient not taking: Reported on 09/08/2020)     No current facility-administered medications for this visit.    REVIEW OF SYSTEMS:  [X]  denotes positive finding, [ ]  denotes negative finding Cardiac  Comments:  Chest pain or chest pressure:    Shortness of breath upon exertion:    Short of breath when lying flat:    Irregular heart rhythm:        Vascular    Pain in calf, thigh, or hip brought on by ambulation:    Pain in feet at night that wakes you up from your sleep:     Blood clot in your veins:    Leg swelling:         Pulmonary    Oxygen at home:    Productive cough:     Wheezing:         Neurologic    Sudden weakness in arms or legs:     Sudden numbness in arms or legs:     Sudden onset of difficulty speaking or slurred speech:    Temporary loss of vision in one eye:     Problems with dizziness:         Gastrointestinal    Blood in stool:     Vomited blood:         Genitourinary    Burning when urinating:     Blood in urine:        Psychiatric    Major depression:         Hematologic    Bleeding problems:    Problems with blood  clotting too easily:        Skin    Rashes or ulcers:        Constitutional    Fever or chills:      PHYSICAL EXAM: Vitals:   09/08/20 1408 09/08/20 1411  BP: 132/82 116/76  Pulse: 95 95  Resp: 18   Temp: (!) 97.3 F (36.3 C)   TempSrc: Temporal   SpO2: 97%   Weight: 232 lb (105.2 kg)   Height: 6\' 1"  (1.854 m)     GENERAL: The patient is a well-nourished male, in no acute distress. The vital signs are documented above. CARDIAC: There is a regular rate and rhythm.  VASCULAR:  Good range of motion in the neck Palpable radial and femoral pulses bilaterally PULMONARY: There is good air exchange bilaterally without wheezing or rales. ABDOMEN: Soft and non-tender. MUSCULOSKELETAL: There are no major deformities or cyanosis. NEUROLOGIC: No focal weakness or paresthesias are detected.  Cranial nerves II through XII grossly intact. SKIN: There are no ulcers or rashes noted. PSYCHIATRIC: The patient has a normal affect.  DATA:   CLINICAL DATA: CVA workup.  EXAM: CT ANGIOGRAPHY HEAD AND NECK  TECHNIQUE: Multidetector CT imaging of the head and neck was performed using the standard protocol during bolus administration of intravenous contrast. Multiplanar CT image reconstructions and MIPs were obtained to evaluate the vascular anatomy. Carotid stenosis measurements (when applicable) are obtained utilizing NASCET criteria, using the distal internal carotid diameter as the denominator.  COMPARISON: Head CT August 01, 2020.  FINDINGS: CT HEAD FINDINGS  Brain: No evidence of acute infarction, hemorrhage, hydrocephalus, extra-axial collection or mass lesion/mass effect. 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  Vascular: No hyperdense vessel. Calcified plaques in the bilateral carotid siphons.  Skull: Normal. Negative for fracture or focal lesion.  Sinuses: Imaged portions are  clear.  Orbits: No acute finding.  Review of the MIP  images confirms the above findings  CTA NECK FINDINGS  Aortic arch: Standard branching. Imaged portion shows no evidence of aneurysm or dissection. Calcified plaques in the aortic arch and at the origin of the major arch arteries without hemodynamically significant stenosis.  Right carotid system: Mixed density plaque at the right carotid bifurcation resulting 75-80% stenosis.  Left carotid system: Calcified plaque at the left carotid bifurcation without hemodynamically significant stenosis.  Vertebral arteries: The right vertebral artery is dominant. Calcified plaque is seen in the right P1 segment resulting in mild stenosis. Mixed density plaque at the origin of the left vertebral artery resulting in his severe stenosis.  Skeleton: No aggressive lesion identified.  Other neck: Negative.  Upper chest: Negative.  Review of the MIP images confirms the above findings  CTA HEAD FINDINGS  Anterior circulation: No significant stenosis, proximal occlusion, aneurysm, or vascular malformation.  Posterior circulation: No significant stenosis, proximal occlusion, aneurysm, or vascular malformation.  Venous sinuses: As permitted by contrast timing, patent.  Anatomic variants: Right fetal PCA.  Review of the MIP images confirms the above findings  IMPRESSION: 1. Mixed density plaque at the right carotid bifurcation resulting in 75-80% stenosis. 2. Mixed density plaque at the origin of the left vertebral artery resulting in severe stenosis. 3. No significant intracranial stenosis, occlusion, aneurysm or vascular malformation  Aortic Atherosclerosis (ICD10-I70.0).   Electronically Signed By: Pedro Earls M.D. On: 08/03/2020 13:58  Assessment/Plan:   80 year old male with multiple medical comorbidities as noted above who presents for evaluation of carotid artery disease.  His event in February that sent him to the [facility] certainly sounds like a  TIA based on his description.  He could not get an MRI but his CTA neck did show a high-grade stenosis in the right ICA with mixed plaque.  I agree that this event could have been related to his A. fib given he was not anticoagulated but also may be related to his carotid artery disease given the extent of diseas.  I discussed with him today that I do think he would benefit from right carotid endarterectomy after review of his imaging.  We discussed risk and benefits of surgery.  I need to discuss his case with Dr. Cyndia Bent with CT surgery to see what his thoughts are regarding timing of surgery and or combined carotid/CABG.  I will get in touch with the patient and let him know accordingly.  [name], MD [facility] Office: [phone]

## 2020-09-15 ENCOUNTER — Encounter: Payer: HMO | Admitting: Vascular Surgery

## 2020-09-15 ENCOUNTER — Encounter (HOSPITAL_COMMUNITY): Payer: HMO

## 2020-09-16 ENCOUNTER — Other Ambulatory Visit: Payer: Self-pay | Admitting: *Deleted

## 2020-09-16 ENCOUNTER — Encounter: Payer: Self-pay | Admitting: *Deleted

## 2020-09-16 ENCOUNTER — Other Ambulatory Visit: Payer: Self-pay

## 2020-09-16 DIAGNOSIS — I2510 Atherosclerotic heart disease of native coronary artery without angina pectoris: Secondary | ICD-10-CM

## 2020-09-18 DIAGNOSIS — E119 Type 2 diabetes mellitus without complications: Secondary | ICD-10-CM | POA: Diagnosis not present

## 2020-09-18 DIAGNOSIS — E785 Hyperlipidemia, unspecified: Secondary | ICD-10-CM | POA: Diagnosis not present

## 2020-10-02 ENCOUNTER — Ambulatory Visit (HOSPITAL_COMMUNITY)
Admission: RE | Admit: 2020-10-02 | Discharge: 2020-10-02 | Disposition: A | Discharge status: Home / Self Care | Payer: HMO | Source: Ambulatory Visit | Attending: Surgery | Admitting: Surgery

## 2020-10-02 ENCOUNTER — Other Ambulatory Visit: Payer: Self-pay

## 2020-10-02 ENCOUNTER — Ambulatory Visit (HOSPITAL_BASED_OUTPATIENT_CLINIC_OR_DEPARTMENT_OTHER)
Admission: RE | Admit: 2020-10-02 | Discharge: 2020-10-02 | Disposition: A | Discharge status: Home / Self Care | Payer: HMO | Source: Ambulatory Visit | Attending: Surgery | Admitting: Surgery

## 2020-10-02 DIAGNOSIS — I2510 Atherosclerotic heart disease of native coronary artery without angina pectoris: Secondary | ICD-10-CM | POA: Diagnosis not present

## 2020-10-02 LAB — ECHOCARDIOGRAM COMPLETE
Calc EF: 47.4 %
S' Lateral: 3.5 cm
Single Plane A2C EF: 56.8 %
Single Plane A4C EF: 35.6 %

## 2020-10-02 NOTE — Progress Notes (Signed)
Pre CABG has been completed.   Preliminary results in CV Proc.   Thomas Mckee 10/02/2020 10:40 AM

## 2020-10-02 NOTE — Progress Notes (Signed)
  Echocardiogram 2D Echocardiogram has been performed.  Thomas Mckee 10/02/2020, 12:15 PM

## 2020-10-07 NOTE — Progress Notes (Signed)
Surgical Instructions    Your procedure is scheduled on 10/12/20.  Report to [facility] Main Entrance "A" at 05:30 A.M., then check in with the Admitting office.  Call this number if you have problems the morning of surgery:  [phone]   If you have any questions prior to your surgery date call [phone]: Open Monday-Friday 8am-4pm    Remember:  Do not eat or drink after midnight the night before your surgery     Take these medicines the morning of surgery with A SIP OF WATER  carvedilol (COREG) DULoxetine (CYMBALTA) gabapentin (NEURONTIN) HYDROcodone-acetaminophen (NORCO/VICODIN) if needed    As of today, STOP taking any (unless otherwise instructed by your surgeon) Aleve, Naproxen, Ibuprofen, Motrin, Advil, Goody's, BC's, all herbal medications, fish oil, and all vitamins.   -You should have stopped your clopidogrel (PLAVIX) when you saw Dr. Cyndia Bent in the office.  -Your last dose of apixaban Arne [place]) will be 10/06/20. Stop taking eliquis on 10/07/20.  -Stop taking your aspirin the day before surgery.     WHAT DO I DO ABOUT MY DIABETES MEDICATION?   Thomas Kitchen Do not take oral diabetes medicines (pills) the morning of surgery.  . THE DAY BEFORE SURGERY, do not take empagliflozin (JARDIANCE). Take 22 units of LANTUS SOLOSTAR.       . THE MORNING OF SURGERY, do not take empagliflozin (JARDIANCE). Take 22 units of LANTUS SOLOSTAR. Do not take metFORMIN (GLUCOPHAGE). Do not take [place].     Thomas Mckee The day of surgery, do not take other diabetes injectables, including Byetta (exenatide), Bydureon (exenatide ER), Victoza (liraglutide), or Trulicity (dulaglutide).  . If your CBG is greater than 220 mg/dL, you may take  of your sliding scale (correction) dose of insulin.   HOW TO MANAGE YOUR DIABETES BEFORE AND AFTER SURGERY  Why is it important to control my blood sugar before and after surgery? . Improving blood sugar levels before and after surgery helps healing and can limit  problems. . A way of improving blood sugar control is eating a healthy diet by: o  Eating less sugar and carbohydrates o  Increasing activity/exercise o  Talking with your doctor about reaching your blood sugar goals . High blood sugars (greater than 180 mg/dL) can raise your risk of infections and slow your recovery, so you will need to focus on controlling your diabetes during the weeks before surgery. . Make sure that the doctor who takes care of your diabetes knows about your planned surgery including the date and location.  How do I manage my blood sugar before surgery? . Check your blood sugar at least 4 times a day, starting 2 days before surgery, to make sure that the level is not too high or low. . Check your blood sugar the morning of your surgery when you wake up and every 2 hours until you get to the Short Stay unit. o If your blood sugar is less than 70 mg/dL, you will need to treat for low blood sugar: - Do not take insulin. - Treat a low blood sugar (less than 70 mg/dL) with  cup of clear juice (cranberry or apple), 4 glucose tablets, OR glucose gel. - Recheck blood sugar in 15 minutes after treatment (to make sure it is greater than 70 mg/dL). If your blood sugar is not greater than 70 mg/dL on recheck, call [phone] for further instructions. . Report your blood sugar to the short stay nurse when you get to Short Stay.  . If you are admitted  to the hospital after surgery: o Your blood sugar will be checked by the staff and you will probably be given insulin after surgery (instead of oral diabetes medicines) to make sure you have good blood sugar levels. o The goal for blood sugar control after surgery is 80-180 mg/dL.                        Do not wear jewelry, make up, or nail polish            Do not wear lotions, powders, perfumes/colognes, or deodorant.            Men may shave face and neck.            Do not bring valuables to the hospital.            [facility] is not responsible for any belongings or valuables.  Do NOT Smoke (Tobacco/Vaping) or drink Alcohol 24 hours prior to your procedure If you use a CPAP at night, you may bring all equipment for your overnight stay.   Contacts, glasses, dentures or bridgework may not be worn into surgery, please bring cases for these belongings   For patients admitted to the hospital, discharge time will be determined by your treatment team.   Patients discharged the day of surgery will not be allowed to drive home, and someone needs to stay with them for 24 hours.    Special instructions:   [place]- Preparing For Surgery  Before surgery, you can play an important role. Because skin is not sterile, your skin needs to be as free of germs as possible. You can reduce the number of germs on your skin by washing with CHG (chlorahexidine gluconate) Soap before surgery.  CHG is an antiseptic cleaner which kills germs and bonds with the skin to continue killing germs even after washing.    Oral Hygiene is also important to reduce your risk of infection.  Remember - BRUSH YOUR TEETH THE MORNING OF SURGERY WITH YOUR REGULAR TOOTHPASTE  Please do not use if you have an allergy to CHG or antibacterial soaps. If your skin becomes reddened/irritated stop using the CHG.  Do not shave (including legs and underarms) for at least 48 hours prior to first CHG shower. It is OK to shave your face.  Please follow these instructions carefully.   1. Shower the NIGHT BEFORE SURGERY and the MORNING OF SURGERY  2. If you chose to wash your hair, wash your hair first as usual with your normal shampoo.  3. After you shampoo, rinse your hair and body thoroughly to remove the shampoo.  4. Wash Face and genitals (private parts) with your normal soap.   5.  Shower the NIGHT BEFORE SURGERY and the MORNING OF SURGERY with CHG Soap.   6. Use CHG Soap as you would any other liquid soap. You can apply CHG directly to the skin  and wash gently with a scrungie or a clean washcloth.   7. Apply the CHG Soap to your body ONLY FROM THE NECK DOWN.  Do not use on open wounds or open sores. Avoid contact with your eyes, ears, mouth and genitals (private parts). Wash Face and genitals (private parts)  with your normal soap.   8. Wash thoroughly, paying special attention to the area where your surgery will be performed.  9. Thoroughly rinse your body with warm water from the neck down.  10. DO NOT shower/wash with  your normal soap after using and rinsing off the CHG Soap.  11. Pat yourself dry with a CLEAN TOWEL.  12. Wear CLEAN PAJAMAS to bed the night before surgery  13. Place CLEAN SHEETS on your bed the night before your surgery  14. DO NOT SLEEP WITH PETS.   Day of Surgery: Take a shower with CHG soap. Wear Clean/Comfortable clothing the morning of surgery Do not apply any deodorants/lotions.   Remember to brush your teeth WITH YOUR REGULAR TOOTHPASTE.   Please read over the following fact sheets that you were given.

## 2020-10-08 ENCOUNTER — Other Ambulatory Visit: Payer: Self-pay

## 2020-10-08 ENCOUNTER — Encounter (HOSPITAL_COMMUNITY): Payer: Self-pay

## 2020-10-08 ENCOUNTER — Encounter (HOSPITAL_COMMUNITY)
Admission: RE | Admit: 2020-10-08 | Discharge: 2020-10-08 | Disposition: A | Discharge status: Home / Self Care | Payer: HMO | Source: Ambulatory Visit | Attending: Surgery | Admitting: Surgery

## 2020-10-08 ENCOUNTER — Ambulatory Visit (HOSPITAL_COMMUNITY)
Admission: RE | Admit: 2020-10-08 | Discharge: 2020-10-08 | Disposition: A | Discharge status: Home / Self Care | Payer: HMO | Source: Ambulatory Visit | Attending: Surgery | Admitting: Surgery

## 2020-10-08 ENCOUNTER — Other Ambulatory Visit (HOSPITAL_COMMUNITY): Payer: HMO

## 2020-10-08 DIAGNOSIS — Z9682 Presence of neurostimulator: Secondary | ICD-10-CM | POA: Insufficient documentation

## 2020-10-08 DIAGNOSIS — Z87891 Personal history of nicotine dependence: Secondary | ICD-10-CM | POA: Diagnosis not present

## 2020-10-08 DIAGNOSIS — Z794 Long term (current) use of insulin: Secondary | ICD-10-CM | POA: Diagnosis not present

## 2020-10-08 DIAGNOSIS — Z8673 Personal history of transient ischemic attack (TIA), and cerebral infarction without residual deficits: Secondary | ICD-10-CM | POA: Insufficient documentation

## 2020-10-08 DIAGNOSIS — I5022 Chronic systolic (congestive) heart failure: Secondary | ICD-10-CM | POA: Insufficient documentation

## 2020-10-08 DIAGNOSIS — Z01818 Encounter for other preprocedural examination: Secondary | ICD-10-CM | POA: Insufficient documentation

## 2020-10-08 DIAGNOSIS — Z7902 Long term (current) use of antithrombotics/antiplatelets: Secondary | ICD-10-CM | POA: Insufficient documentation

## 2020-10-08 DIAGNOSIS — I6521 Occlusion and stenosis of right carotid artery: Secondary | ICD-10-CM | POA: Insufficient documentation

## 2020-10-08 DIAGNOSIS — Z7982 Long term (current) use of aspirin: Secondary | ICD-10-CM | POA: Insufficient documentation

## 2020-10-08 DIAGNOSIS — Z7984 Long term (current) use of oral hypoglycemic drugs: Secondary | ICD-10-CM | POA: Insufficient documentation

## 2020-10-08 DIAGNOSIS — E785 Hyperlipidemia, unspecified: Secondary | ICD-10-CM | POA: Insufficient documentation

## 2020-10-08 DIAGNOSIS — E1122 Type 2 diabetes mellitus with diabetic chronic kidney disease: Secondary | ICD-10-CM | POA: Insufficient documentation

## 2020-10-08 DIAGNOSIS — Z7901 Long term (current) use of anticoagulants: Secondary | ICD-10-CM | POA: Diagnosis not present

## 2020-10-08 DIAGNOSIS — Z20822 Contact with and (suspected) exposure to covid-19: Secondary | ICD-10-CM | POA: Insufficient documentation

## 2020-10-08 DIAGNOSIS — I110 Hypertensive heart disease with heart failure: Secondary | ICD-10-CM | POA: Insufficient documentation

## 2020-10-08 DIAGNOSIS — N183 Chronic kidney disease, stage 3 unspecified: Secondary | ICD-10-CM | POA: Insufficient documentation

## 2020-10-08 DIAGNOSIS — I4819 Other persistent atrial fibrillation: Secondary | ICD-10-CM | POA: Diagnosis not present

## 2020-10-08 DIAGNOSIS — I2510 Atherosclerotic heart disease of native coronary artery without angina pectoris: Secondary | ICD-10-CM | POA: Diagnosis not present

## 2020-10-08 DIAGNOSIS — Z96651 Presence of right artificial knee joint: Secondary | ICD-10-CM | POA: Diagnosis not present

## 2020-10-08 HISTORY — DX: Cerebral infarction, unspecified: I63.9

## 2020-10-08 HISTORY — DX: Malignant (primary) neoplasm, unspecified: C80.1

## 2020-10-08 LAB — TYPE AND SCREEN
ABO/RH(D): A POS
Antibody Screen: NEGATIVE

## 2020-10-08 LAB — CBC
HCT: 44.2 % (ref 39.0–52.0)
Hemoglobin: 14.3 g/dL (ref 13.0–17.0)
MCH: 29.6 pg (ref 26.0–34.0)
MCHC: 32.4 g/dL (ref 30.0–36.0)
MCV: 91.5 fL (ref 80.0–100.0)
Platelets: 209 10*3/uL (ref 150–400)
RBC: 4.83 MIL/uL (ref 4.22–5.81)
RDW: 14 % (ref 11.5–15.5)
WBC: 7.1 10*3/uL (ref 4.0–10.5)
nRBC: 0 % (ref 0.0–0.2)

## 2020-10-08 LAB — PROTIME-INR
INR: 1 (ref 0.8–1.2)
Prothrombin Time: 13.1 seconds (ref 11.4–15.2)

## 2020-10-08 LAB — COMPREHENSIVE METABOLIC PANEL
ALT: 30 U/L (ref 0–44)
AST: 27 U/L (ref 15–41)
Albumin: 4 g/dL (ref 3.5–5.0)
Alkaline Phosphatase: 50 U/L (ref 38–126)
Anion gap: 9 (ref 5–15)
BUN: 24 mg/dL — ABNORMAL HIGH (ref 8–23)
CO2: 19 mmol/L — ABNORMAL LOW (ref 22–32)
Calcium: 9.5 mg/dL (ref 8.9–10.3)
Chloride: 108 mmol/L (ref 98–111)
Creatinine, Ser: 1.24 mg/dL (ref 0.61–1.24)
GFR, Estimated: 60 mL/min — ABNORMAL LOW (ref >60)
Glucose, Bld: 200 mg/dL — ABNORMAL HIGH (ref 70–99)
Potassium: 5.1 mmol/L (ref 3.5–5.1)
Sodium: 136 mmol/L (ref 135–145)
Total Bilirubin: 0.9 mg/dL (ref 0.3–1.2)
Total Protein: 6.9 g/dL (ref 6.5–8.1)

## 2020-10-08 LAB — URINALYSIS, ROUTINE W REFLEX MICROSCOPIC
Bacteria, UA: NONE SEEN
Bilirubin Urine: NEGATIVE
Glucose, UA: >=500 mg/dL — AB
Hgb urine dipstick: NEGATIVE
Ketones, ur: NEGATIVE mg/dL
Leukocytes,Ua: NEGATIVE
Nitrite: NEGATIVE
Protein, ur: NEGATIVE mg/dL
Specific Gravity, Urine: 1.026 (ref 1.005–1.030)
pH: 5 (ref 5.0–8.0)

## 2020-10-08 LAB — BLOOD GAS, ARTERIAL
Acid-base deficit: 3 mmol/L — ABNORMAL HIGH (ref 0.0–2.0)
Bicarbonate: 20.6 mmol/L (ref 20.0–28.0)
Drawn by: 602861
FIO2: 21
O2 Saturation: 99.1 %
Patient temperature: 37
pCO2 arterial: 31.5 mmHg — ABNORMAL LOW (ref 32.0–48.0)
pH, Arterial: 7.43 (ref 7.350–7.450)
pO2, Arterial: 178 mmHg — ABNORMAL HIGH (ref 83.0–108.0)

## 2020-10-08 LAB — GLUCOSE, CAPILLARY: Glucose-Capillary: 202 mg/dL — ABNORMAL HIGH (ref 70–99)

## 2020-10-08 LAB — SARS CORONAVIRUS 2 (TAT 6-24 HRS): SARS Coronavirus 2: NEGATIVE

## 2020-10-08 LAB — APTT: aPTT: 26 seconds (ref 24–36)

## 2020-10-08 LAB — SURGICAL PCR SCREEN
MRSA, PCR: NEGATIVE
Staphylococcus aureus: POSITIVE — AB

## 2020-10-08 LAB — HEMOGLOBIN A1C
Hgb A1c MFr Bld: 7.7 % — ABNORMAL HIGH (ref 4.8–5.6)
Mean Plasma Glucose: 174.29 mg/dL

## 2020-10-08 MED ORDER — MUPIROCIN 2 % EX OINT: 1.0000 "application " | TOPICAL_OINTMENT | Freq: Two times a day (BID) | CUTANEOUS | 0 refills | Status: DC

## 2020-10-08 NOTE — Anesthesia Preprocedure Evaluation (Addendum)
Anesthesia Evaluation  Patient identified by MRN, date of birth, ID band Patient awake    Reviewed: Allergy & Precautions, NPO status , Patient's Chart, lab work & pertinent test results, reviewed documented beta blocker date and time   Airway Mallampati: IV  TM Distance: >3 FB Neck ROM: Full    Dental no notable dental hx.    Pulmonary former smoker,    Pulmonary exam normal breath sounds clear to auscultation       Cardiovascular hypertension, Pt. on home beta blockers and Pt. on medications + CAD and +CHF  Normal cardiovascular exam+ dysrhythmias Atrial Fibrillation  Rhythm:Regular Rate:Normal  ECG: rate 85  ECHO: 1. Left ventricular ejection fraction, by estimation, is 35 to 40%. The left ventricle has moderately decreased function. The left ventricle demonstrates regional wall motion abnormalities (see scoring diagram/findings for description). Left ventricular diastolic function could not be evaluated. 2. Right ventricular systolic function is normal. The right ventricular size is normal. Tricuspid regurgitation signal is inadequate for assessing PA pressure. 3. Left atrial size was mildly dilated. 4. The mitral valve is normal in structure. Trivial mitral valve regurgitation. No evidence of mitral stenosis. Severe mitral annular calcification. 5. The aortic valve is tricuspid. There is moderate calcification of the aortic valve. There is mild thickening of the aortic valve. Aortic valve regurgitation is not visualized. Mild to moderate aortic valve sclerosis/calcification is present, without any evidence of aortic stenosis. 6. The inferior vena cava is normal in size with greater than 50% respiratory variability, suggesting right atrial pressure of 3 mmHg.   Neuro/Psych TIACVA negative psych ROS   GI/Hepatic negative GI ROS, (+)     substance abuse  ,   Endo/Other  diabetes, Oral Hypoglycemic Agents, Insulin  Dependent  Renal/GU Renal disease     Musculoskeletal  (+) Arthritis , narcotic dependent  Abdominal (+) + obese,   Peds  Hematology HLD   Anesthesia Other Findings CAD Carotid artery stenosis   Reproductive/Obstetrics                          Anesthesia Physical Anesthesia Plan  ASA: IV  Anesthesia Plan: General   Post-op Pain Management:    Induction: Intravenous  PONV Risk Score and Plan: 2 and Ondansetron, Dexamethasone, Treatment may vary due to age or medical condition and Midazolam  Airway Management Planned: Oral ETT  Additional Equipment: Arterial line, CVP, TEE, Ultrasound Guidance Line Placement and PA Cath  Intra-op Plan:   Post-operative Plan: Post-operative intubation/ventilation  Informed Consent: I have reviewed the patients History and Physical, chart, labs and discussed the procedure including the risks, benefits and alternatives for the proposed anesthesia with the patient or authorized representative who has indicated his/her understanding and acceptance.     Dental advisory given  Plan Discussed with: CRNA  Anesthesia Plan Comments: (Reviewed PAT note written by Myra Gianotti, PA-C. )      Anesthesia Quick Evaluation

## 2020-10-08 NOTE — Progress Notes (Addendum)
Anesthesia Chart Review:  Case: [id number] Date/Time: 10/12/20 0715   Procedures:      CORONARY ARTERY BYPASS GRAFTING (CABG) (N/A Chest)     CLIPPING OF ATRIAL APPENDAGE (N/A )     TRANSESOPHAGEAL ECHOCARDIOGRAM (TEE) (N/A )     ENDARTERECTOMY CAROTID (Right )   Anesthesia type: General   Pre-op diagnosis: CAD   Location: [facility] OR ROOM 14 / [place] [facility]   Surgeons: Gaye Pollack, MD; [name], MD      DISCUSSION: Patient is a 80 year old male scheduled for the above procedures.  History includes former smoker (quit 06/20/77), CAD, afib, chronic systolic CHF, HTN, DM2, CKD3, HLD, CVA (TIA ? ~ 07/2020, old right cerebellar infarct on 08/03/20 CT), carotid artery stenosis, skin cancer, back surgery (L4-5 laminectomy;  Abnott spinal cord stimulator insertion in [place]; 10/2019 imaging indicates lead tips centered at the T8-9 level), chronic pain.   Last Eliquis 10/07/20 with last ASA 10/11/20.   [facility]A neck at [facility] on 08/03/20 showed mixed density plaque at the right carotid bifurcation resulting in 75-80% stenosis. Pre-CABG dopplers on 10/02/20 Summary showed 1-39% BICA stenosis; however in the body of the report the RICA proximal stenosis is listed as Right Prox ICA PSV 259, EDV 103, 60-79% stenosis which would be more consistent with the CTA. Routed to Dr. Carlis Abbott on 10/08/20.   10/08/20 presurgical COVID-19 test negative.  Anesthesia team to evaluate on the day of surgery.    VS: BP 113/87   Pulse 87   Temp 37.1 C (Oral)   Resp 20   Ht 6\' 1"  (1.854 m)   Wt 106.1 kg   SpO2 98%   BMI 30.86 kg/m     PROVIDERS: Maryella Shivers, MD is PCP  Berniece Salines, DO is cardiologist   LABS: Labs reviewed: Acceptable for surgery. (all labs ordered are listed, but only abnormal results are displayed)  Labs Reviewed  SURGICAL PCR SCREEN - Abnormal; Notable for the following components:      Result Value   Staphylococcus aureus POSITIVE (*)    All other components within normal  limits  GLUCOSE, CAPILLARY - Abnormal; Notable for the following components:   Glucose-Capillary 202 (*)    All other components within normal limits  COMPREHENSIVE METABOLIC PANEL - Abnormal; Notable for the following components:   CO2 19 (*)    Glucose, Bld 200 (*)    BUN 24 (*)    GFR, Estimated 60 (*)    All other components within normal limits  URINALYSIS, ROUTINE W REFLEX MICROSCOPIC - Abnormal; Notable for the following components:   Glucose, UA >=500 (*)    All other components within normal limits  BLOOD GAS, ARTERIAL - Abnormal; Notable for the following components:   pCO2 arterial 31.5 (*)    pO2, Arterial 178 (*)    Acid-base deficit 3.0 (*)    All other components within normal limits  HEMOGLOBIN A1C - Abnormal; Notable for the following components:   Hgb A1c MFr Bld 7.7 (*)    All other components within normal limits  SARS CORONAVIRUS 2 (TAT 6-24 HRS)  CBC  PROTIME-INR  APTT  TYPE AND SCREEN     IMAGES: CXR 10/08/20:  FINDINGS: The heart size and mediastinal contours are within normal limits. Both lungs are clear. The visualized skeletal structures are unremarkable. IMPRESSION: No active cardiopulmonary disease.  CT head and CTA head/neck 08/03/20 [facility], see [place]/PACS): CT head FINDINGS included: Brain: No evidence of acute infarction, hemorrhage,  hydrocephalus, extra-axial collection or mass lesion/mass effect. 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 IMPRESSION: 1. Mixed density plaque at the right carotid bifurcation resulting in 75-80% stenosis. 2. Mixed density plaque at the origin of the left vertebral artery resulting in severe stenosis. 3. No significant intracranial stenosis, occlusion, aneurysm or vascular malformation Aortic Atherosclerosis (ICD10-I70.0).  CT C-spine 08/03/20 [facility], see [place]/PACS): IMPRESSION: 1. Degenerative disc disease at C6-7. 2. No evidence for cervical spine  fracture. 3. Loss of cervical lordosis. This can be related to patient positioning, muscle spasm or soft tissue injury. 4. 4 mm left apical nodule. No follow-up needed if patient is low-risk. Non-contrast chest CT can be considered in 12 months if patient is high-risk. This recommendation follows the consensus statement: Guidelines for Management of Incidental Pulmonary Nodules Detected on CT Images: From the Fleischner Society 2017; Radiology 2017; 284:228-243.   EKG: 10/08/20: Wide QRS rhythm at 85 bpm Left bundle branch block Abnormal ECG - Appears to be afib as no p wave noted. Patient with known afib and LBBB.   CV: Carotid US 10/02/20: Summary:  Right Carotid: Velocities in the right ICA are consistent with a 1-39%  stenosis.  Left Carotid: Velocities in the left ICA are consistent with a 1-39%  stenosis.  Vertebrals: Bilateral vertebral arteries demonstrate antegrade flow.  - In the body of the report the Right Prox ICA measurements are actually documented as PSV 259, EDV 103, 60-79% stenosis which would be more consistent with the 08/03/20 CTA results.      Echo 10/02/20: IMPRESSIONS  1. Left ventricular ejection fraction, by estimation, is 35 to 40%. The  left ventricle has moderately decreased function. The left ventricle  demonstrates regional wall motion abnormalities (see scoring  diagram/findings for description). Left ventricular  diastolic function could not be evaluated.  2. Right ventricular systolic function is normal. The right ventricular  size is normal. Tricuspid regurgitation signal is inadequate for assessing  PA pressure.  3. Left atrial size was mildly dilated.  4. The mitral valve is normal in structure. Trivial mitral valve  regurgitation. No evidence of mitral stenosis. Severe mitral annular  calcification.  5. The aortic valve is tricuspid. There is moderate calcification of the  aortic valve. There is mild thickening of the aortic valve.  Aortic valve  regurgitation is not visualized. Mild to moderate aortic valve  sclerosis/calcification is present, without  any evidence of aortic stenosis.  6. The inferior vena cava is normal in size with greater than 50%  respiratory variability, suggesting right atrial pressure of 3 mmHg.  - Comparison(s): Prior images unable to be directly viewed, comparison made  by report only. Per notes, study in 07/2020 at [place] similar, but  report/images not available.    Cardiac cath 08/18/20:   Ost LAD to Prox LAD lesion is 50% stenosed.  1st Diag lesion is 80% stenosed.  Prox LAD to Mid LAD lesion is 75% stenosed.  RPAV-1 lesion is 75% stenosed.  RPAV-2 lesion is 90% stenosed.  Ramus lesion is 25% stenosed.  LV end diastolic pressure is mildly elevated.  There is no aortic valve stenosis.   Heavily calcified Ostial to mid LAD with severe obstruction in the mid vessel. Very long segment in the LAD would require atherectomy followed by multiple long, overlapping stents, which may be suboptimal treatment in a diabetic patient. Severe disease in the ostial diagonal.  Severe disease in the posterolateral artery.  Given new onset AFib, DM, and heavy  calcium, would refer for surgical eval.  Maze and LAA occluder may be beneficial for him as well.    Past Medical History:  Diagnosis Date  . Abnormal echocardiogram 08/13/2020  . Abnormal ventricular wall motion 08/13/2020  . Atrial fibrillation ([place]) 08/13/2020  . Cancer [facility])    Skin Cancer on legs per patient 2021  . Carotid stenosis   . Cerebral arteriosclerosis with history of previous cerebrovascular accident   . Chronic kidney disease, stage 3 ([place])   . DDD (degenerative disc disease), lumbar 02/28/2019   Last Assessment & Plan:  Formatting of this note might be different from the original. See status post lumbar laminectomy plan  . HFrEF (heart failure with reduced ejection fraction) ([place]) 08/13/2020  . History of CVA  (cerebrovascular accident) 08/13/2020  . LBBB (left bundle branch block) 08/13/2020  . Left ventricular dysfunction   . Lumbar radiculopathy 02/28/2019   Last Assessment & Plan:  Formatting of this note might be different from the original. Continue 800 mg gabapentin t.i.d..  Unable to obtain Lyrica due to cost  . Mixed hyperlipidemia 08/13/2020  . Neck pain 02/28/2019   Last Assessment & Plan:  Formatting of this note might be different from the original. Chronic neck pain with radiation to the bilateral shoulders, left greater than right.  He reports having plain films completed by chiropractor do not have access to these.  Will consider updating imaging for possible procedural consideration of persistent symptoms.  . Other chronic pain 02/28/2019  . Pain management contract agreement 06/27/2019   Last Assessment & Plan:  Formatting of this note might be different from the original. Prior UDS results are consistent with regimen, [place] controlled substance registry is checked and is also consistent.  . Primary hypertension 08/13/2020  . S/P insertion of spinal cord stimulator 02/28/2019   Last Assessment & Plan:  Formatting of this note might be different from the original. He has established with [employer] rep who is helping him to make adjustments to his programming accordingly.  He needs to follow-up with them as directed  . Status post lumbar laminectomy 02/28/2019   Last Assessment & Plan:  Formatting of this note is different from the original. 80 year old male with chronic low back and bilateral lower extremity radicular symptoms status post lumbar laminectomy with subsequent spinal cord stimulator placement. Recently updated plain films of the thoracic and lumbar spine show moderate lower lumbar facet arthrosis with indwellingThoracic stimulatorleadsce  . Stroke [facility])    Mini stroke per patient 08/31/2020  . Systolic CHF ([place])   . Type 2 diabetes mellitus with complication, with long-term  current use of insulin ([place]) 08/13/2020    Past Surgical History:  Procedure Laterality Date  . BACK SURGERY    . LEFT HEART CATH AND CORONARY ANGIOGRAPHY N/A 08/18/2020   Procedure: LEFT HEART CATH AND CORONARY ANGIOGRAPHY;  Surgeon: Jettie Booze, MD;  Location: [place] [facility];  Service: Cardiovascular;  Laterality: N/A;  . REPLACEMENT TOTAL KNEE Right   . SHOULDER SURGERY      MEDICATIONS: . apixaban (ELIQUIS) 5 MG TABS tablet  . aspirin EC 81 MG tablet  . carvedilol (COREG) 3.125 MG tablet  . clopidogrel (PLAVIX) 75 MG tablet  . DULoxetine (CYMBALTA) 30 MG capsule  . empagliflozin (JARDIANCE) 25 MG TABS tablet  . gabapentin (NEURONTIN) 800 MG tablet  . HYDROcodone-acetaminophen (NORCO/VICODIN) 5-325 MG tablet  . LANTUS SOLOSTAR 100 UNIT/ML Solostar Pen  . lisinopril (ZESTRIL) 5 MG tablet  . metFORMIN (  GLUCOPHAGE) 1000 MG tablet  . mupirocin ointment (BACTROBAN) 2 %  . oxymetazoline (NASAL SPRAY 12 HOUR) 0.05 % nasal spray  . OZEMPIC, 0.25 OR 0.5 MG/DOSE, 2 MG/1.5ML SOPN  . simvastatin (ZOCOR) 80 MG tablet   No current facility-administered medications for this encounter.    Myra Gianotti, PA-C Surgical Short Stay/Anesthesiology [facility] Phone [phone] [facility] Phone [phone] 10/09/2020 10:11 AM

## 2020-10-08 NOTE — Progress Notes (Signed)
PCP -  Dr. Maryella Shivers Cardiologist - Saw Berniece Salines, DO per request of PCP   Chest x-ray -  10/08/20 EKG - 10/08/20 Stress Test - n/a ECHO - 10/02/20 Cardiac Cath - 08/18/20  Sleep Study - n/a   CBG= 202 on 10/08/20  Patient states he ate an energy bar while waiting on his appointment  Checks Blood Sugar once per day Ranges from 96-120 per patient  Blood Thinner Instructions: Stop Eliquis on 10/07/20.  Aspirin Instructions: Per MD stop the day before surgery.     COVID TEST-  10/08/20   Anesthesia review: Yes Hx of recent stroke, Abnormal EKG.   Patient denies shortness of breath, fever, cough and chest pain at PAT appointment   All instructions explained to the patient, with a verbal understanding of the material. Patient agrees to go over the instructions while at home for a better understanding. Patient also instructed to self quarantine after being tested for COVID-19. The opportunity to ask questions was provided.

## 2020-10-09 MED ORDER — PHENYLEPHRINE HCL-NACL 20-0.9 MG/250ML-% IV SOLN
30.0000 ug/min | INTRAVENOUS | Status: AC
  Administered 2020-10-12: 25 ug/min via INTRAVENOUS
  Filled 2020-10-09: qty 250

## 2020-10-09 MED ORDER — DEXMEDETOMIDINE HCL IN NACL 400 MCG/100ML IV SOLN
0.1000 ug/kg/h | INTRAVENOUS | Status: AC
  Administered 2020-10-12: .3 ug/kg/h via INTRAVENOUS
  Filled 2020-10-09: qty 100

## 2020-10-09 MED ORDER — TRANEXAMIC ACID (OHS) PUMP PRIME SOLUTION
2.0000 mg/kg | INTRAVENOUS | Status: DC
  Filled 2020-10-09: qty 2.12

## 2020-10-09 MED ORDER — MILRINONE LACTATE IN DEXTROSE 20-5 MG/100ML-% IV SOLN
0.3000 ug/kg/min | INTRAVENOUS | Status: DC
  Filled 2020-10-09: qty 100

## 2020-10-09 MED ORDER — MAGNESIUM SULFATE 50 % IJ SOLN
40.0000 meq | INTRAMUSCULAR | Status: DC
  Filled 2020-10-09: qty 9.85

## 2020-10-09 MED ORDER — NITROGLYCERIN IN D5W 200-5 MCG/ML-% IV SOLN
2.0000 ug/min | INTRAVENOUS | Status: DC
  Filled 2020-10-09: qty 250

## 2020-10-09 MED ORDER — SODIUM CHLORIDE 0.9 % IV SOLN
1.5000 g | INTRAVENOUS | Status: AC
  Administered 2020-10-12 (×2): 1.5 g via INTRAVENOUS
  Filled 2020-10-09: qty 1.5

## 2020-10-09 MED ORDER — EPINEPHRINE HCL 5 MG/250ML IV SOLN IN NS
0.0000 ug/min | INTRAVENOUS | Status: DC
Start: 2020-10-12 — End: 2020-10-13
  Filled 2020-10-09: qty 250

## 2020-10-09 MED ORDER — VANCOMYCIN HCL 1500 MG/300ML IV SOLN
1500.0000 mg | INTRAVENOUS | Status: AC
  Administered 2020-10-12: 1500 mg via INTRAVENOUS
  Filled 2020-10-09: qty 300

## 2020-10-09 MED ORDER — SODIUM CHLORIDE 0.9 % IV SOLN
750.0000 mg | INTRAVENOUS | Status: DC
  Filled 2020-10-09: qty 750

## 2020-10-09 MED ORDER — INSULIN REGULAR(HUMAN) IN NACL 100-0.9 UT/100ML-% IV SOLN
INTRAVENOUS | Status: AC
  Administered 2020-10-12: 1.5 [IU]/h via INTRAVENOUS
  Filled 2020-10-09: qty 100

## 2020-10-09 MED ORDER — TRANEXAMIC ACID (OHS) BOLUS VIA INFUSION
15.0000 mg/kg | INTRAVENOUS | Status: AC
  Administered 2020-10-12: 1591.5 mg via INTRAVENOUS
  Filled 2020-10-09: qty 1592

## 2020-10-09 MED ORDER — PLASMA-LYTE 148 IV SOLN
INTRAVENOUS | Status: DC
  Filled 2020-10-09: qty 2.5

## 2020-10-09 MED ORDER — POTASSIUM CHLORIDE 2 MEQ/ML IV SOLN
80.0000 meq | INTRAVENOUS | Status: DC
  Filled 2020-10-09: qty 40

## 2020-10-09 MED ORDER — SODIUM CHLORIDE 0.9 % IV SOLN
INTRAVENOUS | Status: DC
  Filled 2020-10-09: qty 30

## 2020-10-09 MED ORDER — NOREPINEPHRINE 4 MG/250ML-% IV SOLN
0.0000 ug/min | INTRAVENOUS | Status: DC
  Filled 2020-10-09: qty 250

## 2020-10-09 MED ORDER — TRANEXAMIC ACID 1000 MG/10ML IV SOLN
1.5000 mg/kg/h | INTRAVENOUS | Status: AC
  Administered 2020-10-12: 1.5 mg/kg/h via INTRAVENOUS
  Filled 2020-10-09: qty 25

## 2020-10-11 NOTE — H&P (Signed)
[address]       [place] [address]             [phone]      Cardiothoracic Surgery Admission History and Physical   PCP is Maryella Shivers, MD Referring Provider is Jettie Booze, MD      Chief Complaint  Patient presents with  . Coronary artery disease        HPI:  The patient is a 80 year old gentleman with history of hypertension, hyperlipidemia, diabetes, stage III chronic kidney disease, chronic pain secondary to 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  He tells me that he woke up in the morning and tried to turn on the TV but his hand was weak and he could not figure out how to turn the TV on.  He was also having some visual changes.  He went to the emergency department at [facility] and was diagnosed with a stroke.  Apparently CT showed a previous cerebellar stroke.  He was admitted there for further care.  A 2D echocardiogram reportedly showed a depressed ejection fraction with some wall motion abnormalities.  Electrocardiogram showed left bundle branch block.  He was apparently discharged and sent to see Dr. Harriet Masson for cardiology evaluation.  He waited some intermittent chest although he tells me that he is never had any chest pain or pressure and denies any history of shortness of breath.  He underwent cardiac catheterization by Dr. Irish Lack on 08/18/2020.  This showed severe multivessel coronary disease.  The LAD had 50% ostial to proximal stenosis followed by 75% proximal and mid LAD stenosis.  This area was diffusely calcified.  There is a small diagonal branch had 80% stenosis.  The left circumflex had mild nonobstructive disease.  The right coronary artery was a large dominant vessel that had 75% stenosis after the posterior descending branch 90% stenosis prior to a moderate size posterior lateral branch.  LVEDP was 17.  Given the long area of stenosis in the proximal LAD  that was calcified and his history of diabetes it was felt that coronary bypass surgery may be the best treatment for him and he was referred to me for evaluation.  The patient was also found to be in rate controlled atrial fibrillation when he was seen by Dr. Harriet Masson with no prior history.  He was started on Eliquis 5 mg twice a day due to his CHA2DS2-VASc score of 6.  He tells me today that he has not started taking the Eliquis since he was placed on aspirin and Plavix when he left the hospital and the pharmacy told him that he should not be taking Plavix with the Eliquis.       Past Medical History:  Diagnosis Date  . Carotid stenosis   . Cerebral arteriosclerosis with history of previous cerebrovascular accident   . Chronic kidney disease, stage 3 ([place])   . DDD (degenerative disc disease), lumbar 02/28/2019   Last Assessment & Plan:  Formatting of this note might be different from the original. See status post lumbar laminectomy plan  . Lumbar radiculopathy 02/28/2019   Last Assessment & Plan:  Formatting of this note might be different from the original. Continue 800 mg gabapentin t.i.d..  Unable to obtain Lyrica due to cost  . Neck pain 02/28/2019   Last Assessment & Plan:  Formatting of this note might be different from the original. Chronic neck pain with  radiation to the bilateral shoulders, left greater than right.  He reports having plain films completed by chiropractor do not have access to these.  Will consider updating imaging for possible procedural consideration of persistent symptoms.  . Other chronic pain 02/28/2019  . Pain management contract agreement 06/27/2019   Last Assessment & Plan:  Formatting of this note might be different from the original. Prior UDS results are consistent with regimen, [place] controlled substance registry is checked and is also consistent.  . S/P insertion of spinal cord stimulator 02/28/2019   Last Assessment & Plan:  Formatting of this note  might be different from the original. He has established with [employer] rep who is helping him to make adjustments to his programming accordingly.  He needs to follow-up with them as directed  . Status post lumbar laminectomy 02/28/2019   Last Assessment & Plan:  Formatting of this note is different from the original. 80 year old male with chronic low back and bilateral lower extremity radicular symptoms status post lumbar laminectomy with subsequent spinal cord stimulator placement. Recently updated plain films of the thoracic and lumbar spine show moderate lower lumbar facet arthrosis with indwellingThoracic stimulatorleadsce  . Systolic CHF [facility])     Past Surgical History:  Procedure Laterality Date  . BACK SURGERY    . LEFT HEART CATH AND CORONARY ANGIOGRAPHY N/A 08/18/2020   Procedure: LEFT HEART CATH AND CORONARY ANGIOGRAPHY;  Surgeon: Jettie Booze, MD;  Location: [facility];  Service: Cardiovascular;  Laterality: N/A;  . REPLACEMENT TOTAL KNEE Right   . SHOULDER SURGERY           Family History  Problem Relation Age of Onset  . Diabetes Father   . Diabetes Sister   . Cancer Brother     Social History Social History       Tobacco Use  . Smoking status: Former Research scientist (life sciences)  . Smokeless tobacco: Never Used  Substance Use Topics  . Drug use: Never  He is divorced and lives alone. He has a daughter and 3 grandchildren. 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        Current Outpatient Medications  Medication Sig Dispense Refill  . aspirin EC 81 MG tablet Take 81 mg by mouth daily. Swallow whole.    . carvedilol (COREG) 3.125 MG tablet Take 3.125 mg by mouth 2 (two) times daily with a meal.    . clopidogrel (PLAVIX) 75 MG tablet Take 75 mg by mouth daily.    . DULoxetine (CYMBALTA) 30 MG capsule Take 30 mg by mouth daily.    .  empagliflozin (JARDIANCE) 25 MG TABS tablet Take 25 mg by mouth daily.    Marland Kitchen gabapentin (NEURONTIN) 800 MG tablet Take 800 mg by mouth 3 (three) times daily.    Marland Kitchen HYDROcodone-acetaminophen (NORCO/VICODIN) 5-325 MG tablet Take 1 tablet by mouth every 6 (six) hours as needed for moderate pain.    Marland Kitchen LANTUS SOLOSTAR 100 UNIT/ML Solostar Pen Inject 44 Units into the skin at bedtime.    Marland Kitchen lisinopril (ZESTRIL) 5 MG tablet Take 5 mg by mouth daily.    . metFORMIN (GLUCOPHAGE) 1000 MG tablet Take 1,000 mg by mouth 2 (two) times daily with a meal.    . oxymetazoline (NASAL SPRAY 12 HOUR) 0.05 % nasal spray Place 1 spray into both nostrils daily as needed for congestion.    Marland Kitchen  OZEMPIC, 0.25 OR 0.5 MG/DOSE, 2 MG/1.5ML SOPN Inject 1 mg as directed once a week.    . simvastatin (ZOCOR) 80 MG tablet Take 80 mg by mouth at bedtime.     No current facility-administered medications for this visit.         Allergies  Allergen Reactions  . Morphine     Other reaction(s): Other (see comments) Makes pt dizzy and lightheaded    Review of Systems  Constitutional: Negative for diaphoresis and fatigue.  HENT: Negative.   Eyes: Negative.   Respiratory: Negative for chest tightness and shortness of breath.   Cardiovascular: Negative for chest pain, palpitations and leg swelling.  Gastrointestinal: Negative.   Endocrine: Negative.   Genitourinary: Negative.   Musculoskeletal: Positive for arthralgias and back pain.  Skin: Negative.   Neurological: Negative for dizziness and syncope.  Hematological: Bruises/bleeds easily.  Psychiatric/Behavioral: Negative.     BP 113/76 (BP Location: Left Arm, Patient Position: Sitting)   Pulse 89   Resp 20   Ht 6\' 1"  (1.854 m)   SpO2 98% Comment: RA  BMI 30.08 kg/m  Physical Exam Constitutional:      Appearance: Normal appearance. He is normal weight.  HENT:     Head: Normocephalic and atraumatic.  Eyes:     Extraocular Movements:  Extraocular movements intact.     Conjunctiva/sclera: Conjunctivae normal.     Pupils: Pupils are equal, round, and reactive to light.  Neck:     Vascular: No carotid bruit.  Cardiovascular:     Rate and Rhythm: Normal rate and regular rhythm.     Pulses: Normal pulses.     Heart sounds: Normal heart sounds. No murmur heard.   Pulmonary:     Effort: Pulmonary effort is normal.     Breath sounds: Normal breath sounds.  Abdominal:     General: Bowel sounds are normal. There is no distension.     Palpations: Abdomen is soft.     Tenderness: There is no abdominal tenderness.  Musculoskeletal:        General: No swelling. Normal range of motion.     Cervical back: Normal range of motion and neck supple.  Skin:    General: Skin is warm and dry.  Neurological:     General: No focal deficit present.     Mental Status: He is alert and oriented to person, place, and time.  Psychiatric:        Mood and Affect: Mood normal.        Behavior: Behavior normal.      Diagnostic Tests:  Physicians  Panel Physicians Referring Physician Case Authorizing Physician  Jettie Booze, MD (Primary)      Procedures  LEFT HEART CATH AND CORONARY ANGIOGRAPHY   Conclusion    Ost LAD to Prox LAD lesion is 50% stenosed.  1st Diag lesion is 80% stenosed.  Prox LAD to Mid LAD lesion is 75% stenosed.  RPAV-1 lesion is 75% stenosed.  RPAV-2 lesion is 90% stenosed.  Ramus lesion is 25% stenosed.  LV end diastolic pressure is mildly elevated.  There is no aortic valve stenosis.  Heavily calcified Ostial to mid LAD with severe obstruction in the mid vessel. Very long segment in the LAD would require atherectomy followed by multiple long, overlapping stents, which may be suboptimal treatment in a diabetic patient. Severe disease in the ostial diagonal. Severe disease in the posterolateral artery.  Given new onset AFib, DM, and heavy calcium, would refer for surgical  eval. Maze and LAA occluder may be beneficial for him as well.   Of note, the patient asked me to call his ex-wife Carmell Austria with results. I spoke to her and she was not interested in the results. She only asked if I was calling from [place], and I conveyed that he was in [place]. She asked that we never call her again. I did not mention the actual cath results to her.     Recommendations  Discharge Date In the absence of any other complications or medical issues, we expect the patient to be ready for discharge from a cath perspective on 08/18/2020.   Indications  Left ventricular dysfunction [I51.9 (ICD-10-CM)]   Procedural Details  Technical Details The risks, benefits, and details of the procedure were explained to the patient. The patient verbalized understanding and wanted to proceed. Informed written consent was obtained.  PROCEDURE TECHNIQUE: After Xylocaine anesthesia a 42F slender sheath was placed in the right radial artery with a single anterior needle wall stick. IV Heparin was given. Left heart cath was done using a JR4 catheter. Right coronary angiography was done using a Judkins R4 guide catheter. Left coronary angiography was done using a Judkins L3.5 guide catheter. A TR band was used for hemostasis.   Contrast: 60 cc  Estimated blood loss <50 mL.   During this procedure medications were administered to achieve and maintain moderate conscious sedation while the patient's heart rate, blood pressure, and oxygen saturation were continuously monitored and I was present face-to-face 100% of this time.   Medications (Filter: Administrations occurring from 1300 to 1358 on 08/18/20)  fentaNYL (SUBLIMAZE) injection (mcg) Total dose:  25 mcg  Date/Time Rate/Dose/Volume Action   08/18/20 1314 25 mcg Given    midazolam (VERSED) injection (mg) Total dose:  2 mg  Date/Time Rate/Dose/Volume Action   08/18/20 1315 2 mg Given    Heparin  (Porcine) in NaCl 1000-0.9 UT/500ML-% SOLN (mL) Total volume:  1,000 mL  Date/Time Rate/Dose/Volume Action   08/18/20 1315 500 mL Given   1315 500 mL Given    lidocaine (PF) (XYLOCAINE) 1 % injection (mL) Total volume:  2 mL  Date/Time Rate/Dose/Volume Action   08/18/20 1322 2 mL Given    Radial Cocktail/Verapamil only (mL) Total volume:  10 mL  Date/Time Rate/Dose/Volume Action   08/18/20 1324 10 mL Given    heparin sodium (porcine) injection (Units) Total dose:  5,000 Units  Date/Time Rate/Dose/Volume Action   08/18/20 1328 5,000 Units Given    iohexol (OMNIPAQUE) 350 MG/ML injection (mL) Total volume:  60 mL  Date/Time Rate/Dose/Volume Action   08/18/20 1350 60 mL Given     Sedation Time  Sedation Time Physician-1: 27 minutes 47 seconds   Contrast  Medication Name Total Dose  iohexol (OMNIPAQUE) 350 MG/ML injection 60 mL    Radiation/Fluoro  Fluoro time: 1.9 (min) DAP: 13315 (mGycm2) Cumulative Air Kerma: 99991111 (mGy)   Complications   Complications documented before study signed (08/18/2020 [id number] PM)    No complications were associated with this study.  Documented by Jettie Booze, MD - 08/18/2020 2:08 PM     Coronary Findings   Diagnostic Dominance: Right  Left Anterior Descending  Ost LAD to Prox LAD lesion is 50% stenosed.  Prox LAD to Mid LAD lesion is 75% stenosed. The lesion is severely calcified.  First Diagonal Branch  Vessel is small in size.  1st Diag lesion is 80% stenosed.  Ramus Intermedius  Vessel is large.  Ramus lesion is  25% stenosed.  Left Circumflex  Vessel is small.  Right Coronary Artery  The vessel exhibits minimal luminal irregularities.  Right Posterior Atrioventricular Artery  RPAV-1 lesion is 75% stenosed.  RPAV-2 lesion is 90% stenosed.   Intervention   No interventions have been documented.  Left Heart  Left Ventricle LV end diastolic pressure is  mildly elevated.  Aortic Valve There is no aortic valve stenosis.   Coronary Diagrams   Diagnostic Dominance: Right    Intervention    Implants       No implant documentation for this case.   Syngo Images  Show images for CARDIAC CATHETERIZATION  Images on Long Term Storage  Show images for Kristina, Schriver to Procedure Log  Procedure Log     Hemo Data  Flowsheet Row Most Recent Value  AO Systolic Pressure [id number] mmHg  AO Diastolic Pressure 71 mmHg  AO Mean 98 mmHg  LV Systolic Pressure 0000000 mmHg  LV Diastolic Pressure 12 mmHg  LV EDP 17 mmHg  AOp Systolic Pressure 0000000 mmHg  AOp Diastolic Pressure 71 mmHg  AOp Mean Pressure 99 mmHg  LVp Systolic Pressure Q000111Q mmHg  LVp Diastolic Pressure 12 mmHg  LVp EDP Pressure 16 mmHg   ECHOCARDIOGRAM REPORT       Patient Name:  ELIAM THILGES Date of Exam: 10/02/2020  Medical Rec #: [id number]   Height:    73.0 in  Accession #:  [id number]   Weight:    232.0 lb  Date of Birth: 01-15-1941   BSA:     2.292 m  Patient Age:  81 years    BP:      120/70 mmHg  Patient Gender: M       HR:      79 bpm.  Exam Location: Inpatient   Procedure: 2D Echo   Indications:  CAD of native vessel    History:    Patient has no prior history of Echocardiogram  examinations.         CAD, chronic kidney disease; Risk Factors:Hypertension,         Dyslipidemia and Diabetes.    Sonographer:  Johny Chess  Referring Phys: [id number] [place]    1. Left ventricular ejection fraction, by estimation, is 35 to 40%. The  left ventricle has moderately decreased function. The left ventricle  demonstrates regional wall motion abnormalities (see scoring  diagram/findings for description). Left ventricular  diastolic function could not be evaluated.  2. Right ventricular systolic function is normal. The right  ventricular  size is normal. Tricuspid regurgitation signal is inadequate for assessing  PA pressure.  3. Left atrial size was mildly dilated.  4. The mitral valve is normal in structure. Trivial mitral valve  regurgitation. No evidence of mitral stenosis. Severe mitral annular  calcification.  5. The aortic valve is tricuspid. There is moderate calcification of the  aortic valve. There is mild thickening of the aortic valve. Aortic valve  regurgitation is not visualized. Mild to moderate aortic valve  sclerosis/calcification is present, without  any evidence of aortic stenosis.  6. The inferior vena cava is normal in size with greater than 50%  respiratory variability, suggesting right atrial pressure of 3 mmHg.   Comparison(s): Prior images unable to be directly viewed, comparison made  by report only. Per notes, study in 07/2020 at [place] similar, but  report/images not available.   FINDINGS  Left Ventricle: Left ventricular ejection fraction, by estimation, is 35  to  40%. The left ventricle has moderately decreased function. The left  ventricle demonstrates regional wall motion abnormalities. The left  ventricular internal cavity size was  normal in size. There is no left ventricular hypertrophy. Abnormal  (paradoxical) septal motion, consistent with left bundle branch block.  Left ventricular diastolic function could not be evaluated due to mitral  annular calcification (moderate or  greater). Left ventricular diastolic function could not be evaluated.     LV Wall Scoring:  The anterior septum, mid inferoseptal segment, and basal inferoseptal  segment  are akinetic. The entire anterior wall, entire inferior wall, apical  septal  segment, and apex are hypokinetic. The entire lateral wall is normal.   Right Ventricle: The right ventricular size is normal. No increase in  right ventricular wall thickness. Right ventricular systolic function is  normal. Tricuspid  regurgitation signal is inadequate for assessing PA  pressure.   Left Atrium: Left atrial size was mildly dilated.   Right Atrium: Right atrial size was normal in size.   Pericardium: There is no evidence of pericardial effusion.   Mitral Valve: The mitral valve is normal in structure. Severe mitral  annular calcification. Trivial mitral valve regurgitation. No evidence of  mitral valve stenosis.   Tricuspid Valve: The tricuspid valve is normal in structure. Tricuspid  valve regurgitation is trivial. No evidence of tricuspid stenosis.   Aortic Valve: The aortic valve is tricuspid. There is moderate  calcification of the aortic valve. There is mild thickening of the aortic  valve. There is moderate aortic valve annular calcification. Aortic valve  regurgitation is not visualized. Mild to  moderate aortic valve sclerosis/calcification is present, without any  evidence of aortic stenosis.   Pulmonic Valve: The pulmonic valve was grossly normal. Pulmonic valve  regurgitation is trivial. No evidence of pulmonic stenosis.   Aorta: The aortic root, ascending aorta and aortic arch are all  structurally normal, with no evidence of dilitation or obstruction.   Venous: The inferior vena cava is normal in size with greater than 50%  respiratory variability, suggesting right atrial pressure of 3 mmHg.   IAS/Shunts: The atrial septum is grossly normal.     LEFT VENTRICLE  PLAX 2D  LVIDd:     4.50 cm  LVIDs:     3.50 cm  LV PW:     1.00 cm  LV IVS:    0.90 cm  LVOT diam:   2.10 cm  LV SV:     46  LV SV Index:  20  LVOT Area:   3.46 cm    LV Volumes (MOD)  LV vol d, MOD A2C: 69.2 ml  LV vol d, MOD A4C: 83.5 ml  LV vol s, MOD A2C: 29.9 ml  LV vol s, MOD A4C: 53.8 ml  LV SV MOD A2C:   39.3 ml  LV SV MOD A4C:   83.5 ml  LV SV MOD BP:   35.9 ml   RIGHT VENTRICLE      IVC  RV S prime:   8.38 cm/s IVC diam: 1.40 cm  TAPSE (M-mode): 1.8 cm    LEFT ATRIUM       Index    RIGHT ATRIUM      Index  LA diam:    3.00 cm 1.31 cm/m RA Area:   13.60 cm  LA Vol (A2C):  66.2 ml 28.89 ml/m RA Volume:  33.40 ml 14.57 ml/m  LA Vol (A4C):  62.9 ml 27.45 ml/m  LA Biplane Vol: 65.7 ml  28.67 ml/m  AORTIC VALVE  LVOT Vmax:  71.10 cm/s  LVOT Vmean: 45.100 cm/s  LVOT VTI:  0.134 m    AORTA  Ao Root diam: 3.00 cm  Ao Asc diam: 2.90 cm     SHUNTS  Systemic VTI: 0.13 m  Systemic Diam: 2.10 cm   Buford Dresser MD  Electronically signed by Buford Dresser MD  Signature Date/Time: 10/02/2020/1:39:19 PM   PREOPERATIVE VASCULAR EVALUATION  Indications:   Pre-CABG.  Risk Factors:   Hypertension, hyperlipidemia, Diabetes, prior CVA.  Comparison Study: no prior   Performing Technologist: Abram Sander RVS     Examination Guidelines: A complete evaluation includes B-mode imaging,  spectral  Doppler, color Doppler, and power Doppler as needed of all accessible  portions  of each vessel. Bilateral testing is considered an integral part of a  complete  examination. Limited examinations for reoccurring indications may be  performed  as noted.     Right Carotid Findings:  +----------+--------+--------+--------+-------------------------+--------+       PSV cm/sEDV cm/sStenosisDescribe         Comments  +----------+--------+--------+--------+-------------------------+--------+  CCA Prox 98   17       heterogenous             +----------+--------+--------+--------+-------------------------+--------+  CCA Distal57   15       heterogenous             +----------+--------+--------+--------+-------------------------+--------+  ICA Prox 259   103   60-79% heterogenous and calcific      +----------+--------+--------+--------+-------------------------+--------+  ICA Mid  96   24                           +----------+--------+--------+--------+-------------------------+--------+  ICA Distal66   26                          +----------+--------+--------+--------+-------------------------+--------+  ECA    169                              +----------+--------+--------+--------+-------------------------+--------+   Portions of this table do not appear on this page.     +----------+--------+-------+--------+------------+       PSV cm/sEDV cmsDescribeArm Pressure  +----------+--------+-------+--------+------------+  [id number]                   +----------+--------+-------+--------+------------+   +---------+--------+--+--------+--+---------+  VertebralPSV cm/s56EDV cm/s17Antegrade  +---------+--------+--+--------+--+---------+   Left Carotid Findings:  +----------+--------+--------+--------+------------+--------+       PSV cm/sEDV cm/sStenosisDescribe  Comments  +----------+--------+--------+--------+------------+--------+  CCA Prox 107   23       heterogenous      +----------+--------+--------+--------+------------+--------+  CCA Distal83   25       heterogenous      +----------+--------+--------+--------+------------+--------+  ICA Prox 98   36   1-39%  heterogenous      +----------+--------+--------+--------+------------+--------+  ICA Distal97   36                   +----------+--------+--------+--------+------------+--------+  ECA    134   8                    +----------+--------+--------+--------+------------+--------+    +----------+--------+--------+--------+------------+  SubclavianPSV cm/sEDV cm/sDescribeArm Pressure  +----------+--------+--------+--------+------------+       107                    +----------+--------+--------+--------+------------+   +---------+--------+--+--------+--+---------+  VertebralPSV cm/s53EDV cm/s17Antegrade  +---------+--------+--+--------+--+---------+  ABI Findings:  +--------+------------------+-----+---------+--------+  Right  Rt Pressure (mmHg)IndexWaveform Comment   +--------+------------------+-----+---------+--------+  [id number]           triphasic      +--------+------------------+-----+---------+--------+  PTA   172        1.21 triphasic      +--------+------------------+-----+---------+--------+  DP   163        1.15 triphasic      +--------+------------------+-----+---------+--------+   +--------+------------------+-----+---------+-------+  Left  Lt Pressure (mmHg)IndexWaveform Comment  +--------+------------------+-----+---------+-------+  [id number]           triphasic      +--------+------------------+-----+---------+-------+  PTA   166        1.17 triphasic      +--------+------------------+-----+---------+-------+  DP   153        1.08 triphasic      +--------+------------------+-----+---------+-------+   +-------+---------------+----------------+  ABI/TBIToday's ABI/TBIPrevious ABI/TBI  +-------+---------------+----------------+  Right 1.21                +-------+---------------+----------------+  Left  1.17                +-------+---------------+----------------+     Right Doppler Findings:  +--------+--------+-----+---------+--------+  Site  PressureIndexDoppler Comments  +--------+--------+-----+---------+--------+  [id number]      triphasic      +--------+--------+-----+---------+--------+  Radial        triphasic       +--------+--------+-----+---------+--------+  Ulnar         triphasic      +--------+--------+-----+---------+--------+      Left Doppler Findings:  +--------+--------+-----+---------+--------+  Site  PressureIndexDoppler Comments  +--------+--------+-----+---------+--------+  [id number]      triphasic      +--------+--------+-----+---------+--------+  Radial        triphasic      +--------+--------+-----+---------+--------+  Ulnar         triphasic      +--------+--------+-----+---------+--------+      Summary:  Right Carotid: Velocities in the right ICA are consistent with a 1-39%  stenosis.   Left Carotid: Velocities in the left ICA are consistent with a 1-39%  stenosis.  Vertebrals: Bilateral vertebral arteries demonstrate antegrade flow.   Right ABI: Resting right ankle-brachial index is within normal range. No  evidence of significant right lower extremity arterial disease.  Left ABI: Resting left ankle-brachial index is within normal range. No  evidence of significant left lower extremity arterial disease.  Right Upper Extremity: Doppler waveform obliterate with right radial  compression. Doppler waveforms remain within normal limits with right  ulnar compression.  Left Upper Extremity: Doppler waveforms remain within normal limits with  left radial compression. Doppler waveforms remain within normal limits  with left ulnar compression.       Electronically signed by Servando Snare MD on 10/02/2020 at 2:27:31 PM.      Final    CLINICAL DATA: CVA workup.  EXAM: CT ANGIOGRAPHY HEAD AND NECK  TECHNIQUE: Multidetector CT imaging of the head and neck was performed using the standard protocol during bolus administration of intravenous contrast. Multiplanar CT image reconstructions and MIPs were obtained to evaluate the vascular anatomy. Carotid stenosis measurements (when  applicable) are obtained utilizing NASCET criteria, using the distal internal carotid diameter as the denominator.  COMPARISON: Head CT August 01, 2020.  FINDINGS: CT HEAD FINDINGS  Brain: No evidence of acute infarction, hemorrhage, hydrocephalus, extra-axial collection or mass lesion/mass effect. 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  Vascular: No hyperdense vessel. Calcified plaques in the bilateral carotid siphons.  Skull: Normal. Negative for fracture or focal lesion.  Sinuses: Imaged portions are clear.  Orbits: No acute finding.  Review of the MIP images confirms the above findings  CTA NECK FINDINGS  Aortic arch: Standard branching. Imaged portion shows no evidence of aneurysm or dissection. Calcified plaques in the aortic arch and at the origin of the major arch arteries without hemodynamically significant stenosis.  Right carotid system: Mixed density plaque at the right carotid bifurcation resulting 75-80% stenosis.  Left carotid system: Calcified plaque at the left carotid bifurcation without hemodynamically significant stenosis.  Vertebral arteries: The right vertebral artery is dominant. Calcified plaque is seen in the right P1 segment resulting in mild stenosis. Mixed density plaque at the origin of the left vertebral artery resulting in his severe stenosis.  Skeleton: No aggressive lesion identified.  Other neck: Negative.  Upper chest: Negative.  Review of the MIP images confirms the above findings  CTA HEAD FINDINGS  Anterior circulation: No significant stenosis, proximal occlusion, aneurysm, or vascular malformation.  Posterior circulation: No significant stenosis, proximal occlusion, aneurysm, or vascular malformation.  Venous sinuses: As permitted by contrast timing, patent.  Anatomic variants: Right fetal PCA.  Review of the MIP images confirms the above  findings  IMPRESSION: 1. Mixed density plaque at the right carotid bifurcation resulting in 75-80% stenosis. 2. Mixed density plaque at the origin of the left vertebral artery resulting in severe stenosis. 3. No significant intracranial stenosis, occlusion, aneurysm or vascular malformation  Aortic Atherosclerosis (ICD10-I70.0).   Electronically Signed By: Pedro Earls M.D. On: 08/03/2020 13:58   Impression:  This 80 year old gentleman has severe multivessel coronary disease with a long calcified high-grade stenosis in the proximal to mid LAD as well as a high-grade distal right coronary artery stenosis compromising a moderate sized posterior lateral branch.   He denies any symptoms whatsoever but with diabetes and a reduced ejection fraction he could be having silent ischemia.  He is not felt to be a good PCI candidate given the length and calcification of the stenosis in the proximal to mid LAD.  I think his vessels are reasonable for coronary bypass graft surgery and that may prevent further left ventricular deterioration.  2D echo shows moderate LV systolic dysfunction with an EF of 35-40%. There is no significant valvular disease.   He has rate controlled atrial fibrillation with left bundle branch block morphology.  Given his age and increased operative risk and the fact that his atrial fibrillation is rate controlled and he is asymptomatic I do not think a Maze procedure is indicated.  I would plan to place a clip on his left atrial appendage. He has a high grade right ICA stenosis with mixed plaque on CTA of the neck and given his recent TIA symptoms in Feb that sent him to [facility] Dr. Carlis Abbott is planning right CEA at the same time as CABG.   Plan:  CABG and clipping of left atrial appendage in conjunction with right CEA by Dr. Carlis Abbott.  Gaye Pollack, MD [facility] [phone]

## 2020-10-12 ENCOUNTER — Inpatient Hospital Stay (HOSPITAL_COMMUNITY)
Admission: RE | Admit: 2020-10-12 | Discharge: 2020-10-19 | DRG: 236 | Disposition: A | Discharge status: Home Health | Payer: HMO | Attending: Surgery | Admitting: Surgery

## 2020-10-12 ENCOUNTER — Inpatient Hospital Stay (HOSPITAL_COMMUNITY): Payer: HMO

## 2020-10-12 ENCOUNTER — Inpatient Hospital Stay (HOSPITAL_COMMUNITY)
Admission: RE | Disposition: A | Discharge status: Home Health | Payer: Self-pay | Source: Home / Self Care | Attending: Surgery

## 2020-10-12 ENCOUNTER — Encounter (HOSPITAL_COMMUNITY): Payer: Self-pay | Admitting: Surgery

## 2020-10-12 ENCOUNTER — Inpatient Hospital Stay (HOSPITAL_COMMUNITY): Payer: HMO | Admitting: Vascular Surgery

## 2020-10-12 ENCOUNTER — Other Ambulatory Visit: Payer: Self-pay

## 2020-10-12 DIAGNOSIS — M545 Low back pain, unspecified: Secondary | ICD-10-CM | POA: Diagnosis present

## 2020-10-12 DIAGNOSIS — E1122 Type 2 diabetes mellitus with diabetic chronic kidney disease: Secondary | ICD-10-CM | POA: Diagnosis present

## 2020-10-12 DIAGNOSIS — E785 Hyperlipidemia, unspecified: Secondary | ICD-10-CM | POA: Diagnosis not present

## 2020-10-12 DIAGNOSIS — I130 Hypertensive heart and chronic kidney disease with heart failure and stage 1 through stage 4 chronic kidney disease, or unspecified chronic kidney disease: Secondary | ICD-10-CM | POA: Diagnosis present

## 2020-10-12 DIAGNOSIS — I6523 Occlusion and stenosis of bilateral carotid arteries: Secondary | ICD-10-CM | POA: Diagnosis not present

## 2020-10-12 DIAGNOSIS — I442 Atrioventricular block, complete: Secondary | ICD-10-CM | POA: Diagnosis not present

## 2020-10-12 DIAGNOSIS — Z9689 Presence of other specified functional implants: Secondary | ICD-10-CM

## 2020-10-12 DIAGNOSIS — Z7902 Long term (current) use of antithrombotics/antiplatelets: Secondary | ICD-10-CM

## 2020-10-12 DIAGNOSIS — Y832 Surgical operation with anastomosis, bypass or graft as the cause of abnormal reaction of the patient, or of later complication, without mention of misadventure at the time of the procedure: Secondary | ICD-10-CM | POA: Diagnosis not present

## 2020-10-12 DIAGNOSIS — E782 Mixed hyperlipidemia: Secondary | ICD-10-CM | POA: Diagnosis not present

## 2020-10-12 DIAGNOSIS — G8929 Other chronic pain: Secondary | ICD-10-CM | POA: Diagnosis not present

## 2020-10-12 DIAGNOSIS — Z885 Allergy status to narcotic agent status: Secondary | ICD-10-CM | POA: Diagnosis not present

## 2020-10-12 DIAGNOSIS — R001 Bradycardia, unspecified: Secondary | ICD-10-CM | POA: Diagnosis not present

## 2020-10-12 DIAGNOSIS — I2584 Coronary atherosclerosis due to calcified coronary lesion: Secondary | ICD-10-CM | POA: Diagnosis not present

## 2020-10-12 DIAGNOSIS — N183 Chronic kidney disease, stage 3 unspecified: Secondary | ICD-10-CM | POA: Diagnosis present

## 2020-10-12 DIAGNOSIS — Z79899 Other long term (current) drug therapy: Secondary | ICD-10-CM | POA: Diagnosis not present

## 2020-10-12 DIAGNOSIS — Z833 Family history of diabetes mellitus: Secondary | ICD-10-CM

## 2020-10-12 DIAGNOSIS — Z85828 Personal history of other malignant neoplasm of skin: Secondary | ICD-10-CM

## 2020-10-12 DIAGNOSIS — I2510 Atherosclerotic heart disease of native coronary artery without angina pectoris: Principal | ICD-10-CM | POA: Diagnosis present

## 2020-10-12 DIAGNOSIS — I480 Paroxysmal atrial fibrillation: Secondary | ICD-10-CM

## 2020-10-12 DIAGNOSIS — I6521 Occlusion and stenosis of right carotid artery: Secondary | ICD-10-CM | POA: Diagnosis not present

## 2020-10-12 DIAGNOSIS — I4891 Unspecified atrial fibrillation: Secondary | ICD-10-CM | POA: Diagnosis not present

## 2020-10-12 DIAGNOSIS — I519 Heart disease, unspecified: Secondary | ICD-10-CM

## 2020-10-12 DIAGNOSIS — D62 Acute posthemorrhagic anemia: Secondary | ICD-10-CM | POA: Diagnosis not present

## 2020-10-12 DIAGNOSIS — Z951 Presence of aortocoronary bypass graft: Secondary | ICD-10-CM

## 2020-10-12 DIAGNOSIS — Z7984 Long term (current) use of oral hypoglycemic drugs: Secondary | ICD-10-CM | POA: Diagnosis not present

## 2020-10-12 DIAGNOSIS — E119 Type 2 diabetes mellitus without complications: Secondary | ICD-10-CM | POA: Diagnosis not present

## 2020-10-12 DIAGNOSIS — Z96651 Presence of right artificial knee joint: Secondary | ICD-10-CM | POA: Diagnosis present

## 2020-10-12 DIAGNOSIS — I5022 Chronic systolic (congestive) heart failure: Secondary | ICD-10-CM | POA: Diagnosis present

## 2020-10-12 DIAGNOSIS — R21 Rash and other nonspecific skin eruption: Secondary | ICD-10-CM | POA: Diagnosis not present

## 2020-10-12 DIAGNOSIS — E669 Obesity, unspecified: Secondary | ICD-10-CM | POA: Diagnosis present

## 2020-10-12 DIAGNOSIS — Z794 Long term (current) use of insulin: Secondary | ICD-10-CM

## 2020-10-12 DIAGNOSIS — M5116 Intervertebral disc disorders with radiculopathy, lumbar region: Secondary | ICD-10-CM | POA: Diagnosis present

## 2020-10-12 DIAGNOSIS — Z7982 Long term (current) use of aspirin: Secondary | ICD-10-CM

## 2020-10-12 DIAGNOSIS — Z87891 Personal history of nicotine dependence: Secondary | ICD-10-CM | POA: Diagnosis not present

## 2020-10-12 DIAGNOSIS — J9811 Atelectasis: Secondary | ICD-10-CM | POA: Diagnosis not present

## 2020-10-12 DIAGNOSIS — M1990 Unspecified osteoarthritis, unspecified site: Secondary | ICD-10-CM | POA: Diagnosis present

## 2020-10-12 DIAGNOSIS — Z87828 Personal history of other (healed) physical injury and trauma: Secondary | ICD-10-CM

## 2020-10-12 DIAGNOSIS — Z8673 Personal history of transient ischemic attack (TIA), and cerebral infarction without residual deficits: Secondary | ICD-10-CM | POA: Diagnosis not present

## 2020-10-12 DIAGNOSIS — I517 Cardiomegaly: Secondary | ICD-10-CM | POA: Diagnosis not present

## 2020-10-12 DIAGNOSIS — I97190 Other postprocedural cardiac functional disturbances following cardiac surgery: Secondary | ICD-10-CM | POA: Diagnosis not present

## 2020-10-12 DIAGNOSIS — I340 Nonrheumatic mitral (valve) insufficiency: Secondary | ICD-10-CM | POA: Diagnosis not present

## 2020-10-12 DIAGNOSIS — Z6831 Body mass index (BMI) 31.0-31.9, adult: Secondary | ICD-10-CM

## 2020-10-12 DIAGNOSIS — Z9889 Other specified postprocedural states: Secondary | ICD-10-CM

## 2020-10-12 DIAGNOSIS — I10 Essential (primary) hypertension: Secondary | ICD-10-CM

## 2020-10-12 DIAGNOSIS — N1831 Chronic kidney disease, stage 3a: Secondary | ICD-10-CM

## 2020-10-12 DIAGNOSIS — I447 Left bundle-branch block, unspecified: Secondary | ICD-10-CM

## 2020-10-12 DIAGNOSIS — I441 Atrioventricular block, second degree: Secondary | ICD-10-CM | POA: Diagnosis not present

## 2020-10-12 HISTORY — PX: CLIPPING OF ATRIAL APPENDAGE: SHX5773

## 2020-10-12 HISTORY — PX: ENDARTERECTOMY: SHX5162

## 2020-10-12 HISTORY — PX: CORONARY ARTERY BYPASS GRAFT: SHX141

## 2020-10-12 HISTORY — PX: TEE WITHOUT CARDIOVERSION: SHX5443

## 2020-10-12 HISTORY — DX: Presence of aortocoronary bypass graft: Z95.1

## 2020-10-12 LAB — POCT I-STAT, CHEM 8
BUN: 24 mg/dL — ABNORMAL HIGH (ref 8–23)
BUN: 22 mg/dL (ref 8–23)
BUN: 20 mg/dL (ref 8–23)
BUN: 20 mg/dL (ref 8–23)
Calcium, Ion: 1.3 mmol/L (ref 1.15–1.40)
Calcium, Ion: 1.26 mmol/L (ref 1.15–1.40)
Calcium, Ion: 1.09 mmol/L — ABNORMAL LOW (ref 1.15–1.40)
Calcium, Ion: 1.54 mmol/L (ref 1.15–1.40)
Chloride: 105 mmol/L (ref 98–111)
Chloride: 106 mmol/L (ref 98–111)
Chloride: 104 mmol/L (ref 98–111)
Chloride: 107 mmol/L (ref 98–111)
Creatinine, Ser: 0.9 mg/dL (ref 0.61–1.24)
Creatinine, Ser: 0.9 mg/dL (ref 0.61–1.24)
Creatinine, Ser: 0.7 mg/dL (ref 0.61–1.24)
Creatinine, Ser: 0.8 mg/dL (ref 0.61–1.24)
Glucose, Bld: 92 mg/dL (ref 70–99)
Glucose, Bld: 83 mg/dL (ref 70–99)
Glucose, Bld: 82 mg/dL (ref 70–99)
Glucose, Bld: 111 mg/dL — ABNORMAL HIGH (ref 70–99)
HCT: 38 % — ABNORMAL LOW (ref 39.0–52.0)
HCT: 34 % — ABNORMAL LOW (ref 39.0–52.0)
HCT: 26 % — ABNORMAL LOW (ref 39.0–52.0)
HCT: 26 % — ABNORMAL LOW (ref 39.0–52.0)
Hemoglobin: 12.9 g/dL — ABNORMAL LOW (ref 13.0–17.0)
Hemoglobin: 11.6 g/dL — ABNORMAL LOW (ref 13.0–17.0)
Hemoglobin: 8.8 g/dL — ABNORMAL LOW (ref 13.0–17.0)
Hemoglobin: 8.8 g/dL — ABNORMAL LOW (ref 13.0–17.0)
Potassium: 4.6 mmol/L (ref 3.5–5.1)
Potassium: 4.3 mmol/L (ref 3.5–5.1)
Potassium: 4.5 mmol/L (ref 3.5–5.1)
Potassium: 4.6 mmol/L (ref 3.5–5.1)
Sodium: 139 mmol/L (ref 135–145)
Sodium: 139 mmol/L (ref 135–145)
Sodium: 139 mmol/L (ref 135–145)
Sodium: 137 mmol/L (ref 135–145)
TCO2: 24 mmol/L (ref 22–32)
TCO2: 22 mmol/L (ref 22–32)
TCO2: 23 mmol/L (ref 22–32)
TCO2: 22 mmol/L (ref 22–32)

## 2020-10-12 LAB — CBC
HCT: 34.1 % — ABNORMAL LOW (ref 39.0–52.0)
HCT: 33.9 % — ABNORMAL LOW (ref 39.0–52.0)
Hemoglobin: 10.9 g/dL — ABNORMAL LOW (ref 13.0–17.0)
Hemoglobin: 11.1 g/dL — ABNORMAL LOW (ref 13.0–17.0)
MCH: 29.7 pg (ref 26.0–34.0)
MCH: 30 pg (ref 26.0–34.0)
MCHC: 32 g/dL (ref 30.0–36.0)
MCHC: 32.7 g/dL (ref 30.0–36.0)
MCV: 92.9 fL (ref 80.0–100.0)
MCV: 91.6 fL (ref 80.0–100.0)
Platelets: 118 10*3/uL — ABNORMAL LOW (ref 150–400)
Platelets: 130 10*3/uL — ABNORMAL LOW (ref 150–400)
RBC: 3.67 MIL/uL — ABNORMAL LOW (ref 4.22–5.81)
RBC: 3.7 MIL/uL — ABNORMAL LOW (ref 4.22–5.81)
RDW: 14.1 % (ref 11.5–15.5)
RDW: 14.1 % (ref 11.5–15.5)
WBC: 8.8 10*3/uL (ref 4.0–10.5)
WBC: 13.2 10*3/uL — ABNORMAL HIGH (ref 4.0–10.5)
nRBC: 0 % (ref 0.0–0.2)
nRBC: 0 % (ref 0.0–0.2)

## 2020-10-12 LAB — POCT I-STAT 7, (LYTES, BLD GAS, ICA,H+H)
Acid-Base Excess: 0 mmol/L (ref 0.0–2.0)
Acid-base deficit: 2 mmol/L (ref 0.0–2.0)
Acid-base deficit: 6 mmol/L — ABNORMAL HIGH (ref 0.0–2.0)
Acid-base deficit: 2 mmol/L (ref 0.0–2.0)
Acid-base deficit: 3 mmol/L — ABNORMAL HIGH (ref 0.0–2.0)
Bicarbonate: 24.7 mmol/L (ref 20.0–28.0)
Bicarbonate: 23.1 mmol/L (ref 20.0–28.0)
Bicarbonate: 20.8 mmol/L (ref 20.0–28.0)
Bicarbonate: 23 mmol/L (ref 20.0–28.0)
Bicarbonate: 23.9 mmol/L (ref 20.0–28.0)
Calcium, Ion: 1.07 mmol/L — ABNORMAL LOW (ref 1.15–1.40)
Calcium, Ion: 1.12 mmol/L — ABNORMAL LOW (ref 1.15–1.40)
Calcium, Ion: 1.32 mmol/L (ref 1.15–1.40)
Calcium, Ion: 1.18 mmol/L (ref 1.15–1.40)
Calcium, Ion: 1.2 mmol/L (ref 1.15–1.40)
HCT: 28 % — ABNORMAL LOW (ref 39.0–52.0)
HCT: 27 % — ABNORMAL LOW (ref 39.0–52.0)
HCT: 32 % — ABNORMAL LOW (ref 39.0–52.0)
HCT: 32 % — ABNORMAL LOW (ref 39.0–52.0)
HCT: 31 % — ABNORMAL LOW (ref 39.0–52.0)
Hemoglobin: 9.5 g/dL — ABNORMAL LOW (ref 13.0–17.0)
Hemoglobin: 9.2 g/dL — ABNORMAL LOW (ref 13.0–17.0)
Hemoglobin: 10.9 g/dL — ABNORMAL LOW (ref 13.0–17.0)
Hemoglobin: 10.9 g/dL — ABNORMAL LOW (ref 13.0–17.0)
Hemoglobin: 10.5 g/dL — ABNORMAL LOW (ref 13.0–17.0)
O2 Saturation: 100 %
O2 Saturation: 100 %
O2 Saturation: 99 %
O2 Saturation: 100 %
O2 Saturation: 99 %
Patient temperature: 36.3
Patient temperature: 36.9
Patient temperature: 36.8
Potassium: 4.5 mmol/L (ref 3.5–5.1)
Potassium: 4.9 mmol/L (ref 3.5–5.1)
Potassium: 4.2 mmol/L (ref 3.5–5.1)
Potassium: 4.3 mmol/L (ref 3.5–5.1)
Potassium: 4.3 mmol/L (ref 3.5–5.1)
Sodium: 139 mmol/L (ref 135–145)
Sodium: 137 mmol/L (ref 135–145)
Sodium: 140 mmol/L (ref 135–145)
Sodium: 141 mmol/L (ref 135–145)
Sodium: 142 mmol/L (ref 135–145)
TCO2: 26 mmol/L (ref 22–32)
TCO2: 24 mmol/L (ref 22–32)
TCO2: 22 mmol/L (ref 22–32)
TCO2: 24 mmol/L (ref 22–32)
TCO2: 25 mmol/L (ref 22–32)
pCO2 arterial: 37.6 mmHg (ref 32.0–48.0)
pCO2 arterial: 38.5 mmHg (ref 32.0–48.0)
pCO2 arterial: 42.2 mmHg (ref 32.0–48.0)
pCO2 arterial: 38.2 mmHg (ref 32.0–48.0)
pCO2 arterial: 47 mmHg (ref 32.0–48.0)
pH, Arterial: 7.426 (ref 7.350–7.450)
pH, Arterial: 7.387 (ref 7.350–7.450)
pH, Arterial: 7.297 — ABNORMAL LOW (ref 7.350–7.450)
pH, Arterial: 7.387 (ref 7.350–7.450)
pH, Arterial: 7.312 — ABNORMAL LOW (ref 7.350–7.450)
pO2, Arterial: 393 mmHg — ABNORMAL HIGH (ref 83.0–108.0)
pO2, Arterial: 301 mmHg — ABNORMAL HIGH (ref 83.0–108.0)
pO2, Arterial: 162 mmHg — ABNORMAL HIGH (ref 83.0–108.0)
pO2, Arterial: 197 mmHg — ABNORMAL HIGH (ref 83.0–108.0)
pO2, Arterial: 183 mmHg — ABNORMAL HIGH (ref 83.0–108.0)

## 2020-10-12 LAB — POCT I-STAT EG7
Acid-base deficit: 2 mmol/L (ref 0.0–2.0)
Bicarbonate: 22.7 mmol/L (ref 20.0–28.0)
Calcium, Ion: 1.13 mmol/L — ABNORMAL LOW (ref 1.15–1.40)
HCT: 29 % — ABNORMAL LOW (ref 39.0–52.0)
Hemoglobin: 9.9 g/dL — ABNORMAL LOW (ref 13.0–17.0)
O2 Saturation: 86 %
Potassium: 4.1 mmol/L (ref 3.5–5.1)
Sodium: 139 mmol/L (ref 135–145)
TCO2: 24 mmol/L (ref 22–32)
pCO2, Ven: 37.4 mmHg — ABNORMAL LOW (ref 44.0–60.0)
pH, Ven: 7.392 (ref 7.250–7.430)
pO2, Ven: 52 mmHg — ABNORMAL HIGH (ref 32.0–45.0)

## 2020-10-12 LAB — GLUCOSE, CAPILLARY
Glucose-Capillary: 103 mg/dL — ABNORMAL HIGH (ref 70–99)
Glucose-Capillary: 104 mg/dL — ABNORMAL HIGH (ref 70–99)
Glucose-Capillary: 141 mg/dL — ABNORMAL HIGH (ref 70–99)
Glucose-Capillary: 143 mg/dL — ABNORMAL HIGH (ref 70–99)
Glucose-Capillary: 84 mg/dL (ref 70–99)
Glucose-Capillary: 86 mg/dL (ref 70–99)
Glucose-Capillary: 92 mg/dL (ref 70–99)
Glucose-Capillary: 96 mg/dL (ref 70–99)
Glucose-Capillary: 99 mg/dL (ref 70–99)
Glucose-Capillary: 99 mg/dL (ref 70–99)

## 2020-10-12 LAB — BASIC METABOLIC PANEL
Anion gap: 10 (ref 5–15)
BUN: 19 mg/dL (ref 8–23)
CO2: 21 mmol/L — ABNORMAL LOW (ref 22–32)
Calcium: 8.2 mg/dL — ABNORMAL LOW (ref 8.9–10.3)
Chloride: 105 mmol/L (ref 98–111)
Creatinine, Ser: 0.9 mg/dL (ref 0.61–1.24)
GFR, Estimated: >60 mL/min (ref >60)
Glucose, Bld: 100 mg/dL — ABNORMAL HIGH (ref 70–99)
Potassium: 4.2 mmol/L (ref 3.5–5.1)
Sodium: 136 mmol/L (ref 135–145)

## 2020-10-12 LAB — POCT ACTIVATED CLOTTING TIME: Activated Clotting Time: 272 seconds

## 2020-10-12 LAB — PLATELET COUNT: Platelets: 120 10*3/uL — ABNORMAL LOW (ref 150–400)

## 2020-10-12 LAB — ECHO INTRAOPERATIVE TEE
Height: 73 in
Weight: 3742.39 oz

## 2020-10-12 LAB — PROTIME-INR
INR: 1.5 — ABNORMAL HIGH (ref 0.8–1.2)
Prothrombin Time: 17.9 seconds — ABNORMAL HIGH (ref 11.4–15.2)

## 2020-10-12 LAB — ABO/RH: ABO/RH(D): A POS

## 2020-10-12 LAB — MAGNESIUM: Magnesium: 2.5 mg/dL — ABNORMAL HIGH (ref 1.7–2.4)

## 2020-10-12 LAB — APTT: aPTT: 29 seconds (ref 24–36)

## 2020-10-12 LAB — HEMOGLOBIN AND HEMATOCRIT, BLOOD
HCT: 28.3 % — ABNORMAL LOW (ref 39.0–52.0)
Hemoglobin: 9.3 g/dL — ABNORMAL LOW (ref 13.0–17.0)

## 2020-10-12 SURGERY — CORONARY ARTERY BYPASS GRAFTING (CABG)
Anesthesia: General | Site: Chest | Laterality: Right

## 2020-10-12 MED ORDER — MAGNESIUM SULFATE 4 GM/100ML IV SOLN
4.0000 g | Freq: Once | INTRAVENOUS | Status: AC
  Administered 2020-10-12: 4 g via INTRAVENOUS
  Filled 2020-10-12: qty 100

## 2020-10-12 MED ORDER — PLASMA-LYTE 148 IV SOLN
INTRAVENOUS | Status: DC | PRN
  Administered 2020-10-12: 500 mL via INTRAVASCULAR

## 2020-10-12 MED ORDER — FENTANYL CITRATE (PF) 250 MCG/5ML IJ SOLN
INTRAMUSCULAR | Status: DC | PRN
  Administered 2020-10-12 (×4): 100 ug via INTRAVENOUS
  Administered 2020-10-12: 50 ug via INTRAVENOUS
  Administered 2020-10-12: 200 ug via INTRAVENOUS
  Administered 2020-10-12: 50 ug via INTRAVENOUS
  Administered 2020-10-12 (×2): 100 ug via INTRAVENOUS
  Administered 2020-10-12 (×2): 50 ug via INTRAVENOUS
  Administered 2020-10-12: 100 ug via INTRAVENOUS
  Administered 2020-10-12: 50 ug via INTRAVENOUS
  Administered 2020-10-12: 100 ug via INTRAVENOUS

## 2020-10-12 MED ORDER — CEFUROXIME SODIUM 750 MG IJ SOLR: 750.0000 mg | Freq: Three times a day (TID) | INTRAMUSCULAR | Status: DC

## 2020-10-12 MED ORDER — CHLORHEXIDINE GLUCONATE CLOTH 2 % EX PADS: 6.0000 | MEDICATED_PAD | Freq: Once | CUTANEOUS | Status: DC

## 2020-10-12 MED ORDER — MIDAZOLAM HCL (PF) 10 MG/2ML IJ SOLN
INTRAMUSCULAR | Status: AC
  Filled 2020-10-12: qty 2

## 2020-10-12 MED ORDER — METOPROLOL TARTRATE 12.5 MG HALF TABLET: 12.5000 mg | ORAL_TABLET | Freq: Once | ORAL | Status: DC

## 2020-10-12 MED ORDER — LACTATED RINGERS IV SOLN: INTRAVENOUS | Status: DC | PRN

## 2020-10-12 MED ORDER — BISACODYL 10 MG RE SUPP: 10.0000 mg | Freq: Every day | RECTAL | Status: DC

## 2020-10-12 MED ORDER — SODIUM CHLORIDE 0.9 % IV SOLN
INTRAVENOUS | Status: DC | PRN
  Administered 2020-10-12: 500 mL

## 2020-10-12 MED ORDER — NITROGLYCERIN IN D5W 200-5 MCG/ML-% IV SOLN: 0.0000 ug/min | INTRAVENOUS | Status: DC

## 2020-10-12 MED ORDER — FENTANYL CITRATE (PF) 250 MCG/5ML IJ SOLN
INTRAMUSCULAR | Status: AC
  Filled 2020-10-12: qty 10

## 2020-10-12 MED ORDER — PHENYLEPHRINE 40 MCG/ML (10ML) SYRINGE FOR IV PUSH (FOR BLOOD PRESSURE SUPPORT)
PREFILLED_SYRINGE | INTRAVENOUS | Status: DC | PRN
  Administered 2020-10-12: 80 ug via INTRAVENOUS

## 2020-10-12 MED ORDER — SODIUM CHLORIDE 0.9 % IV SOLN: 1.5000 g | Freq: Two times a day (BID) | INTRAVENOUS | Status: DC

## 2020-10-12 MED ORDER — ASPIRIN EC 325 MG PO TBEC
325.0000 mg | DELAYED_RELEASE_TABLET | Freq: Every day | ORAL | Status: DC
  Administered 2020-10-13 – 2020-10-14 (×2): 325 mg via ORAL
  Filled 2020-10-12 (×2): qty 1

## 2020-10-12 MED ORDER — HEMOSTATIC AGENTS (NO CHARGE) OPTIME
TOPICAL | Status: DC | PRN
  Administered 2020-10-12: 1 via TOPICAL

## 2020-10-12 MED ORDER — FAMOTIDINE IN NACL 20-0.9 MG/50ML-% IV SOLN
20.0000 mg | Freq: Two times a day (BID) | INTRAVENOUS | Status: DC
  Administered 2020-10-12: 20 mg via INTRAVENOUS

## 2020-10-12 MED ORDER — SODIUM CHLORIDE 0.9 % IV SOLN: INTRAVENOUS | Status: DC

## 2020-10-12 MED ORDER — ACETAMINOPHEN 650 MG RE SUPP
650.0000 mg | Freq: Once | RECTAL | Status: AC
  Administered 2020-10-12: 650 mg via RECTAL

## 2020-10-12 MED ORDER — SODIUM CHLORIDE 0.9 % IV SOLN
1.5000 g | Freq: Two times a day (BID) | INTRAVENOUS | Status: AC
  Administered 2020-10-12 – 2020-10-14 (×4): 1.5 g via INTRAVENOUS
  Filled 2020-10-12 (×4): qty 1.5

## 2020-10-12 MED ORDER — ROCURONIUM BROMIDE 10 MG/ML (PF) SYRINGE
PREFILLED_SYRINGE | INTRAVENOUS | Status: AC
  Filled 2020-10-12: qty 10

## 2020-10-12 MED ORDER — PROTAMINE SULFATE 10 MG/ML IV SOLN
INTRAVENOUS | Status: AC
  Filled 2020-10-12: qty 5

## 2020-10-12 MED ORDER — LIDOCAINE 2% (20 MG/ML) 5 ML SYRINGE
INTRAMUSCULAR | Status: AC
  Filled 2020-10-12: qty 5

## 2020-10-12 MED ORDER — SODIUM CHLORIDE 0.45 % IV SOLN: INTRAVENOUS | Status: DC | PRN

## 2020-10-12 MED ORDER — PROPOFOL 10 MG/ML IV BOLUS
INTRAVENOUS | Status: AC
  Filled 2020-10-12: qty 20

## 2020-10-12 MED ORDER — HEPARIN SODIUM (PORCINE) 1000 UNIT/ML IJ SOLN
INTRAMUSCULAR | Status: AC
  Filled 2020-10-12: qty 1

## 2020-10-12 MED ORDER — FENTANYL CITRATE (PF) 250 MCG/5ML IJ SOLN
INTRAMUSCULAR | Status: AC
  Filled 2020-10-12: qty 20

## 2020-10-12 MED ORDER — ACETAMINOPHEN 500 MG PO TABS
1000.0000 mg | ORAL_TABLET | Freq: Four times a day (QID) | ORAL | Status: DC
  Administered 2020-10-13 – 2020-10-14 (×6): 1000 mg via ORAL
  Filled 2020-10-12 (×4): qty 2

## 2020-10-12 MED ORDER — SODIUM CHLORIDE 0.9 % IV SOLN
INTRAVENOUS | Status: AC
  Filled 2020-10-12: qty 1.2

## 2020-10-12 MED ORDER — 0.9 % SODIUM CHLORIDE (POUR BTL) OPTIME
TOPICAL | Status: DC | PRN
  Administered 2020-10-12: 2000 mL

## 2020-10-12 MED ORDER — LACTATED RINGERS IV SOLN: 500.0000 mL | Freq: Once | INTRAVENOUS | Status: DC | PRN

## 2020-10-12 MED ORDER — TRAMADOL HCL 50 MG PO TABS
50.0000 mg | ORAL_TABLET | ORAL | Status: DC | PRN
Start: 2020-10-12 — End: 2020-10-14

## 2020-10-12 MED ORDER — OXYCODONE HCL 5 MG PO TABS
5.0000 mg | ORAL_TABLET | ORAL | Status: DC | PRN
  Administered 2020-10-13 (×2): 10 mg via ORAL
  Filled 2020-10-12 (×2): qty 2

## 2020-10-12 MED ORDER — LIDOCAINE 2% (20 MG/ML) 5 ML SYRINGE
INTRAMUSCULAR | Status: DC | PRN
  Administered 2020-10-12: 60 mg via INTRAVENOUS

## 2020-10-12 MED ORDER — ALBUMIN HUMAN 5 % IV SOLN
250.0000 mL | INTRAVENOUS | Status: AC | PRN
  Administered 2020-10-12 (×4): 12.5 g via INTRAVENOUS
  Filled 2020-10-12 (×2): qty 250

## 2020-10-12 MED ORDER — PANTOPRAZOLE SODIUM 40 MG PO TBEC
40.0000 mg | DELAYED_RELEASE_TABLET | Freq: Every day | ORAL | Status: DC
  Administered 2020-10-14: 40 mg via ORAL
  Filled 2020-10-12: qty 1

## 2020-10-12 MED ORDER — POTASSIUM CHLORIDE 10 MEQ/50ML IV SOLN: 10.0000 meq | INTRAVENOUS | Status: AC

## 2020-10-12 MED ORDER — BISACODYL 5 MG PO TBEC
10.0000 mg | DELAYED_RELEASE_TABLET | Freq: Every day | ORAL | Status: DC
  Administered 2020-10-13 – 2020-10-14 (×2): 10 mg via ORAL
  Filled 2020-10-12 (×2): qty 2

## 2020-10-12 MED ORDER — ASPIRIN 81 MG PO CHEW: 324.0000 mg | CHEWABLE_TABLET | Freq: Every day | ORAL | Status: DC

## 2020-10-12 MED ORDER — ONDANSETRON HCL 4 MG/2ML IJ SOLN
4.0000 mg | Freq: Four times a day (QID) | INTRAMUSCULAR | Status: DC | PRN
  Filled 2020-10-12: qty 2

## 2020-10-12 MED ORDER — SODIUM BICARBONATE 8.4 % IV SOLN
100.0000 meq | Freq: Once | INTRAVENOUS | Status: AC
  Administered 2020-10-12: 100 meq via INTRAVENOUS

## 2020-10-12 MED ORDER — HEPARIN SODIUM (PORCINE) 1000 UNIT/ML IJ SOLN
INTRAMUSCULAR | Status: DC | PRN
  Administered 2020-10-12: 11000 [IU] via INTRAVENOUS
  Administered 2020-10-12: 32000 [IU] via INTRAVENOUS

## 2020-10-12 MED ORDER — METOPROLOL TARTRATE 12.5 MG HALF TABLET: 12.5000 mg | ORAL_TABLET | Freq: Two times a day (BID) | ORAL | Status: DC

## 2020-10-12 MED ORDER — PROTAMINE SULFATE 10 MG/ML IV SOLN
INTRAVENOUS | Status: AC
  Filled 2020-10-12: qty 25

## 2020-10-12 MED ORDER — ROCURONIUM BROMIDE 10 MG/ML (PF) SYRINGE
PREFILLED_SYRINGE | INTRAVENOUS | Status: AC
  Filled 2020-10-12: qty 20

## 2020-10-12 MED ORDER — CHLORHEXIDINE GLUCONATE 0.12 % MT SOLN
15.0000 mL | Freq: Once | OROMUCOSAL | Status: AC
  Administered 2020-10-12: 15 mL via OROMUCOSAL
  Filled 2020-10-12: qty 15

## 2020-10-12 MED ORDER — HYDROCORTISONE NA SUCCINATE PF 250 MG IJ SOLR
INTRAMUSCULAR | Status: AC
  Filled 2020-10-12: qty 250

## 2020-10-12 MED ORDER — METOPROLOL TARTRATE 25 MG/10 ML ORAL SUSPENSION: 12.5000 mg | Freq: Two times a day (BID) | ORAL | Status: DC

## 2020-10-12 MED ORDER — FENTANYL CITRATE (PF) 100 MCG/2ML IJ SOLN
50.0000 ug | INTRAMUSCULAR | Status: DC | PRN
  Administered 2020-10-12: 50 ug via INTRAVENOUS
  Filled 2020-10-12: qty 2

## 2020-10-12 MED ORDER — SODIUM CHLORIDE 0.9% FLUSH
3.0000 mL | Freq: Two times a day (BID) | INTRAVENOUS | Status: DC
  Administered 2020-10-13 – 2020-10-14 (×3): 3 mL via INTRAVENOUS

## 2020-10-12 MED ORDER — LIDOCAINE HCL 1 % IJ SOLN
INTRAMUSCULAR | Status: AC
  Filled 2020-10-12: qty 20

## 2020-10-12 MED ORDER — THROMBIN 20000 UNITS EX SOLR
CUTANEOUS | Status: DC | PRN
  Administered 2020-10-12: 20000 [IU] via TOPICAL

## 2020-10-12 MED ORDER — PHENYLEPHRINE HCL-NACL 20-0.9 MG/250ML-% IV SOLN
0.0000 ug/min | INTRAVENOUS | Status: DC
  Administered 2020-10-13: 10 ug/min via INTRAVENOUS
  Filled 2020-10-12: qty 250

## 2020-10-12 MED ORDER — ALBUMIN HUMAN 5 % IV SOLN: INTRAVENOUS | Status: DC | PRN

## 2020-10-12 MED ORDER — THROMBIN 20000 UNITS EX SOLR
OROMUCOSAL | Status: DC | PRN
  Administered 2020-10-12 (×3): 4 mL via TOPICAL

## 2020-10-12 MED ORDER — SODIUM CHLORIDE 0.9 % IV SOLN
1.5000 g | INTRAVENOUS | Status: DC
  Filled 2020-10-12: qty 1.5

## 2020-10-12 MED ORDER — LACTATED RINGERS IV SOLN: INTRAVENOUS | Status: DC

## 2020-10-12 MED ORDER — PHENYLEPHRINE 40 MCG/ML (10ML) SYRINGE FOR IV PUSH (FOR BLOOD PRESSURE SUPPORT)
PREFILLED_SYRINGE | INTRAVENOUS | Status: AC
  Filled 2020-10-12: qty 10

## 2020-10-12 MED ORDER — PROTAMINE SULFATE 10 MG/ML IV SOLN
INTRAVENOUS | Status: DC | PRN
  Administered 2020-10-12: 20 mg via INTRAVENOUS
  Administered 2020-10-12: 300 mg via INTRAVENOUS

## 2020-10-12 MED ORDER — INSULIN REGULAR(HUMAN) IN NACL 100-0.9 UT/100ML-% IV SOLN: INTRAVENOUS | Status: DC

## 2020-10-12 MED ORDER — SODIUM CHLORIDE 0.9 % IV SOLN: 250.0000 mL | INTRAVENOUS | Status: DC

## 2020-10-12 MED ORDER — ACETAMINOPHEN 160 MG/5ML PO SOLN: 1000.0000 mg | Freq: Four times a day (QID) | ORAL | Status: DC

## 2020-10-12 MED ORDER — MIDAZOLAM HCL 5 MG/5ML IJ SOLN
INTRAMUSCULAR | Status: DC | PRN
  Administered 2020-10-12: 1 mg via INTRAVENOUS
  Administered 2020-10-12: 4 mg via INTRAVENOUS
  Administered 2020-10-12: 2 mg via INTRAVENOUS
  Administered 2020-10-12: 3 mg via INTRAVENOUS

## 2020-10-12 MED ORDER — MIDAZOLAM HCL 2 MG/2ML IJ SOLN: 2.0000 mg | INTRAMUSCULAR | Status: DC | PRN

## 2020-10-12 MED ORDER — VANCOMYCIN HCL IN DEXTROSE 1-5 GM/200ML-% IV SOLN
1000.0000 mg | Freq: Once | INTRAVENOUS | Status: AC
  Administered 2020-10-12: 1000 mg via INTRAVENOUS
  Filled 2020-10-12: qty 200

## 2020-10-12 MED ORDER — ROCURONIUM BROMIDE 10 MG/ML (PF) SYRINGE
PREFILLED_SYRINGE | INTRAVENOUS | Status: DC | PRN
  Administered 2020-10-12: 100 mg via INTRAVENOUS
  Administered 2020-10-12 (×2): 50 mg via INTRAVENOUS
  Administered 2020-10-12: 30 mg via INTRAVENOUS

## 2020-10-12 MED ORDER — DEXMEDETOMIDINE HCL IN NACL 400 MCG/100ML IV SOLN: 0.0000 ug/kg/h | INTRAVENOUS | Status: DC

## 2020-10-12 MED ORDER — DOCUSATE SODIUM 100 MG PO CAPS
200.0000 mg | ORAL_CAPSULE | Freq: Every day | ORAL | Status: DC
  Administered 2020-10-13 – 2020-10-14 (×2): 200 mg via ORAL
  Filled 2020-10-12 (×2): qty 2

## 2020-10-12 MED ORDER — CALCIUM CHLORIDE 10 % IV SOLN
INTRAVENOUS | Status: AC
  Filled 2020-10-12: qty 10

## 2020-10-12 MED ORDER — SODIUM CHLORIDE 0.9% FLUSH: 3.0000 mL | INTRAVENOUS | Status: DC | PRN

## 2020-10-12 MED ORDER — DEXTROSE 50 % IV SOLN: 0.0000 mL | INTRAVENOUS | Status: DC | PRN

## 2020-10-12 MED ORDER — PROPOFOL 10 MG/ML IV BOLUS
INTRAVENOUS | Status: DC | PRN
  Administered 2020-10-12: 100 mg via INTRAVENOUS

## 2020-10-12 MED ORDER — METOPROLOL TARTRATE 5 MG/5ML IV SOLN: 2.5000 mg | INTRAVENOUS | Status: DC | PRN

## 2020-10-12 MED ORDER — CHLORHEXIDINE GLUCONATE 0.12 % MT SOLN
15.0000 mL | OROMUCOSAL | Status: AC
  Administered 2020-10-12: 15 mL via OROMUCOSAL

## 2020-10-12 MED ORDER — THROMBIN (RECOMBINANT) 20000 UNITS EX SOLR
CUTANEOUS | Status: AC
  Filled 2020-10-12: qty 20000

## 2020-10-12 MED ORDER — CHLORHEXIDINE GLUCONATE 4 % EX LIQD: 30.0000 mL | CUTANEOUS | Status: DC

## 2020-10-12 MED ORDER — CALCIUM CHLORIDE 10 % IV SOLN
INTRAVENOUS | Status: DC | PRN
  Administered 2020-10-12 (×2): 100 mg via INTRAVENOUS
  Administered 2020-10-12: 300 mg via INTRAVENOUS
  Administered 2020-10-12: 200 mg via INTRAVENOUS

## 2020-10-12 MED ORDER — ACETAMINOPHEN 160 MG/5ML PO SOLN: 650.0000 mg | Freq: Once | ORAL | Status: AC

## 2020-10-12 SURGICAL SUPPLY — 136 items
ARTICLIP LAA PROCLIP II 45 (Clip) ×4 IMPLANT
BAG DECANTER FOR FLEXI CONT (MISCELLANEOUS) ×4 IMPLANT
BLADE CLIPPER SURG (BLADE) ×4 IMPLANT
BLADE STERNUM SYSTEM 6 (BLADE) ×4 IMPLANT
BNDG ELASTIC 4X5.8 VLCR STR LF (GAUZE/BANDAGES/DRESSINGS) ×4 IMPLANT
BNDG ELASTIC 6X5.8 VLCR STR LF (GAUZE/BANDAGES/DRESSINGS) ×4 IMPLANT
BNDG GAUZE ELAST 4 BULKY (GAUZE/BANDAGES/DRESSINGS) ×4 IMPLANT
BOOT SUTURE AID YELLOW STND (SUTURE) ×4 IMPLANT
CANISTER SUCT 3000ML PPV (MISCELLANEOUS) ×8 IMPLANT
CATH ROBINSON RED A/P 18FR (CATHETERS) ×12 IMPLANT
CATH THORACIC 28FR (CATHETERS) ×4 IMPLANT
CATH THORACIC 36FR (CATHETERS) ×4 IMPLANT
CATH THORACIC 36FR RT ANG (CATHETERS) ×4 IMPLANT
CLIP VESOCCLUDE MED 24/CT (CLIP) ×4 IMPLANT
CLIP VESOCCLUDE SM WIDE 24/CT (CLIP) ×8 IMPLANT
COVER PROBE W GEL 5X96 (DRAPES) ×4 IMPLANT
COVER TRANSDUCER ULTRASND GEL (DISPOSABLE) ×4 IMPLANT
COVER WAND RF STERILE (DRAPES) ×4 IMPLANT
DERMABOND ADVANCED (GAUZE/BANDAGES/DRESSINGS) ×2
DERMABOND ADVANCED .7 DNX12 (GAUZE/BANDAGES/DRESSINGS) ×6 IMPLANT
DEVICE ATRICLIP LAA PRCLPII 45 (Clip) ×3 IMPLANT
DRAIN CHANNEL 10M FLAT 3/4 FLT (DRAIN) ×4 IMPLANT
DRAIN CHANNEL 15F RND FF W/TCR (WOUND CARE) IMPLANT
DRAPE CARDIOVASCULAR INCISE (DRAPES) ×1
DRAPE INCISE IOBAN 66X45 STRL (DRAPES) ×4 IMPLANT
DRAPE SLUSH/WARMER DISC (DRAPES) ×4 IMPLANT
DRAPE SRG 135X102X78XABS (DRAPES) ×3 IMPLANT
DRSG COVADERM 4X14 (GAUZE/BANDAGES/DRESSINGS) ×8 IMPLANT
DRSG COVADERM 4X8 (GAUZE/BANDAGES/DRESSINGS) ×4 IMPLANT
ELECT CAUTERY BLADE 6.4 (BLADE) ×4 IMPLANT
ELECT REM PT RETURN 9FT ADLT (ELECTROSURGICAL) ×12
ELECTRODE REM PT RTRN 9FT ADLT (ELECTROSURGICAL) ×9 IMPLANT
EVACUATOR SILICONE 100CC (DRAIN) ×4 IMPLANT
FELT TEFLON 1X6 (MISCELLANEOUS) ×4 IMPLANT
GAUZE SPONGE 4X4 12PLY STRL (GAUZE/BANDAGES/DRESSINGS) ×12 IMPLANT
GAUZE SPONGE 4X4 12PLY STRL LF (GAUZE/BANDAGES/DRESSINGS) ×12 IMPLANT
GLOVE BIO SURGEON STRL SZ 6 (GLOVE) IMPLANT
GLOVE BIO SURGEON STRL SZ 6.5 (GLOVE) IMPLANT
GLOVE BIO SURGEON STRL SZ7 (GLOVE) IMPLANT
GLOVE BIO SURGEON STRL SZ7.5 (GLOVE) ×4 IMPLANT
GLOVE BIOGEL PI IND STRL 6 (GLOVE) IMPLANT
GLOVE BIOGEL PI INDICATOR 6 (GLOVE)
GLOVE ORTHO TXT STRL SZ7.5 (GLOVE) IMPLANT
GLOVE SRG 8 PF TXTR STRL LF DI (GLOVE) ×3 IMPLANT
GLOVE SURG MICRO LTX SZ6 (GLOVE) ×16 IMPLANT
GLOVE SURG MICRO LTX SZ6.5 (GLOVE) ×20 IMPLANT
GLOVE SURG MICRO LTX SZ7 (GLOVE) ×8 IMPLANT
GLOVE SURG UNDER POLY LF SZ6.5 (GLOVE) IMPLANT
GLOVE SURG UNDER POLY LF SZ7 (GLOVE) IMPLANT
GLOVE SURG UNDER POLY LF SZ8 (GLOVE) ×1
GOWN STRL REUS W/ TWL LRG LVL3 (GOWN DISPOSABLE) ×30 IMPLANT
GOWN STRL REUS W/ TWL XL LVL3 (GOWN DISPOSABLE) ×15 IMPLANT
GOWN STRL REUS W/TWL LRG LVL3 (GOWN DISPOSABLE) ×10
GOWN STRL REUS W/TWL XL LVL3 (GOWN DISPOSABLE) ×5
HEMOSTAT POWDER SURGIFOAM 1G (HEMOSTASIS) ×12 IMPLANT
HEMOSTAT SNOW SURGICEL 2X4 (HEMOSTASIS) IMPLANT
HEMOSTAT SURGICEL 2X14 (HEMOSTASIS) ×4 IMPLANT
INSERT FOGARTY 61MM (MISCELLANEOUS) IMPLANT
INSERT FOGARTY XLG (MISCELLANEOUS) IMPLANT
IV ADAPTER SYR DOUBLE MALE LL (MISCELLANEOUS) IMPLANT
KIT BASIN OR (CUSTOM PROCEDURE TRAY) ×8 IMPLANT
KIT CATH CPB BARTLE (MISCELLANEOUS) ×4 IMPLANT
KIT SHUNT ARGYLE CAROTID ART 6 (VASCULAR PRODUCTS) IMPLANT
KIT SUCTION CATH 14FR (SUCTIONS) ×4 IMPLANT
KIT TURNOVER KIT B (KITS) ×8 IMPLANT
KIT VASOVIEW HEMOPRO 2 VH 4000 (KITS) ×4 IMPLANT
LOOP VESSEL MINI RED (MISCELLANEOUS) IMPLANT
NEEDLE HYPO 25GX1X1/2 BEV (NEEDLE) IMPLANT
NEEDLE SPNL 20GX3.5 QUINCKE YW (NEEDLE) IMPLANT
NS IRRIG 1000ML POUR BTL (IV SOLUTION) ×32 IMPLANT
PACK CAROTID (CUSTOM PROCEDURE TRAY) ×4 IMPLANT
PACK E OPEN HEART (SUTURE) ×4 IMPLANT
PACK OPEN HEART (CUSTOM PROCEDURE TRAY) ×4 IMPLANT
PAD ARMBOARD 7.5X6 YLW CONV (MISCELLANEOUS) ×16 IMPLANT
PAD ELECT DEFIB RADIOL ZOLL (MISCELLANEOUS) ×4 IMPLANT
PATCH VASC XENOSURE 1CMX6CM (Vascular Products) ×1 IMPLANT
PATCH VASC XENOSURE 1X6 (Vascular Products) ×3 IMPLANT
PENCIL BUTTON HOLSTER BLD 10FT (ELECTRODE) ×4 IMPLANT
POSITIONER HEAD DONUT 9IN (MISCELLANEOUS) ×8 IMPLANT
PUNCH AORTIC ROTATE 4.0MM (MISCELLANEOUS) IMPLANT
PUNCH AORTIC ROTATE 4.5MM 8IN (MISCELLANEOUS) ×4 IMPLANT
PUNCH AORTIC ROTATE 5MM 8IN (MISCELLANEOUS) IMPLANT
SET CARDIOPLEGIA MPS 5001102 (MISCELLANEOUS) ×4 IMPLANT
SHUNT CAROTID BYPASS 10 (VASCULAR PRODUCTS) IMPLANT
SHUNT CAROTID BYPASS 12FRX15.5 (VASCULAR PRODUCTS) IMPLANT
SPONGE INTESTINAL PEANUT (DISPOSABLE) IMPLANT
SPONGE LAP 18X18 RF (DISPOSABLE) ×12 IMPLANT
SPONGE LAP 18X18 X RAY DECT (DISPOSABLE) ×4 IMPLANT
SPONGE LAP 4X18 RFD (DISPOSABLE) ×4 IMPLANT
SPONGE SURGIFOAM ABS GEL 100 (HEMOSTASIS) IMPLANT
SPONGE T-LAP 4X18 ~~LOC~~+RFID (SPONGE) ×4 IMPLANT
STOPCOCK 4 WAY LG BORE MALE ST (IV SETS) IMPLANT
SUPPORT HEART JANKE-BARRON (MISCELLANEOUS) ×4 IMPLANT
SUT BONE WAX W31G (SUTURE) ×4 IMPLANT
SUT ETHILON 3 0 FSL (SUTURE) ×8 IMPLANT
SUT ETHILON 3 0 PS 1 (SUTURE) IMPLANT
SUT MNCRL AB 4-0 PS2 18 (SUTURE) ×12 IMPLANT
SUT PROLENE 3 0 SH DA (SUTURE) IMPLANT
SUT PROLENE 3 0 SH1 36 (SUTURE) ×4 IMPLANT
SUT PROLENE 4 0 RB 1 (SUTURE)
SUT PROLENE 4 0 SH DA (SUTURE) IMPLANT
SUT PROLENE 4-0 RB1 .5 CRCL 36 (SUTURE) IMPLANT
SUT PROLENE 5 0 C 1 24 (SUTURE) ×12 IMPLANT
SUT PROLENE 5 0 C 1 36 (SUTURE) IMPLANT
SUT PROLENE 6 0 BV (SUTURE) ×20 IMPLANT
SUT PROLENE 6 0 C 1 30 (SUTURE) IMPLANT
SUT PROLENE 7 0 BV 1 (SUTURE) IMPLANT
SUT PROLENE 7 0 BV1 MDA (SUTURE) ×4 IMPLANT
SUT PROLENE 8 0 BV175 6 (SUTURE) IMPLANT
SUT SILK  1 MH (SUTURE)
SUT SILK 1 MH (SUTURE) IMPLANT
SUT SILK 2 0 SH (SUTURE) IMPLANT
SUT SILK 3 0 (SUTURE)
SUT SILK 3-0 18XBRD TIE 12 (SUTURE) IMPLANT
SUT STEEL STERNAL CCS#1 18IN (SUTURE) ×4 IMPLANT
SUT STEEL SZ 6 DBL 3X14 BALL (SUTURE) ×8 IMPLANT
SUT VIC AB 1 CTX 36 (SUTURE) ×3
SUT VIC AB 1 CTX36XBRD ANBCTR (SUTURE) ×9 IMPLANT
SUT VIC AB 2-0 CT1 27 (SUTURE) ×1
SUT VIC AB 2-0 CT1 TAPERPNT 27 (SUTURE) ×3 IMPLANT
SUT VIC AB 2-0 CTX 27 (SUTURE) IMPLANT
SUT VIC AB 3-0 SH 27 (SUTURE) ×2
SUT VIC AB 3-0 SH 27X BRD (SUTURE) ×6 IMPLANT
SUT VIC AB 3-0 X1 27 (SUTURE) IMPLANT
SUT VICRYL 4-0 PS2 18IN ABS (SUTURE) IMPLANT
SYR CONTROL 10ML LL (SYRINGE) IMPLANT
SYSTEM SAHARA CHEST DRAIN ATS (WOUND CARE) ×4 IMPLANT
TAPE CLOTH SURG 4X10 WHT LF (GAUZE/BANDAGES/DRESSINGS) ×12 IMPLANT
TAPE PAPER 2X10 WHT MICROPORE (GAUZE/BANDAGES/DRESSINGS) ×4 IMPLANT
TOWEL GREEN STERILE (TOWEL DISPOSABLE) ×8 IMPLANT
TOWEL GREEN STERILE FF (TOWEL DISPOSABLE) ×4 IMPLANT
TRAY FOLEY SLVR 16FR TEMP STAT (SET/KITS/TRAYS/PACK) ×4 IMPLANT
TUBING ART PRESS 48 MALE/FEM (TUBING) IMPLANT
TUBING LAP HI FLOW INSUFFLATIO (TUBING) ×4 IMPLANT
UNDERPAD 30X36 HEAVY ABSORB (UNDERPADS AND DIAPERS) ×4 IMPLANT
WATER STERILE IRR 1000ML POUR (IV SOLUTION) ×12 IMPLANT

## 2020-10-12 NOTE — Interval H&P Note (Signed)
History and Physical Interval Note:  10/12/2020 7:16 AM  Thomas Mckee  has presented today for surgery, with the diagnosis of CAD.  The various methods of treatment have been discussed with the patient and family. After consideration of risks, benefits and other options for treatment, the patient has consented to  Procedure(s): CORONARY ARTERY BYPASS GRAFTING (CABG) (N/A) CLIPPING OF ATRIAL APPENDAGE (N/A) TRANSESOPHAGEAL ECHOCARDIOGRAM (TEE) (N/A) ENDARTERECTOMY CAROTID (Right) as a surgical intervention.  The patient's history has been reviewed, patient examined, no change in status, stable for surgery.  I have reviewed the patient's chart and labs.  Questions were answered to the patient's satisfaction.     Gaye Pollack

## 2020-10-12 NOTE — Procedures (Signed)
Extubation Procedure Note  Patient Details:   Name: Thomas Mckee DOB: 1940/07/22 MRN: [id number]   Airway Documentation:  Airway 8 mm (Active)  Secured at (cm) 24 cm 10/12/20 1936  Measured From Lips 10/12/20 1936  Secured Location Left 10/12/20 1936  Secured By Pink Tape 10/12/20 1936  Prone position No 10/12/20 1936  Cuff Pressure (cm H2O) Clear OR 27-39 CmH2O 10/12/20 1936  Site Condition Dry 10/12/20 1936   Vent end date: (not recorded) Vent end time: (not recorded)   Evaluation  O2 sats: stable throughout Complications: No apparent complications Patient did tolerate procedure well. Bilateral Breath Sounds: Diminished,Clear   Yes  Jacques Willingham 10/12/2020, 8:35 PM

## 2020-10-12 NOTE — Op Note (Signed)
CARDIOVASCULAR SURGERY OPERATIVE NOTE  10/12/2020  Surgeon:  Gaye Pollack, MD  First Assistant: Enid Cutter, PA-C   Preoperative Diagnosis:  Severe multi-vessel coronary artery disease   Postoperative Diagnosis:  Same   Procedure:  1. Median Sternotomy 2. Extracorporeal circulation 3.   Coronary artery bypass grafting x 3   Left internal mammary artery graft to the LAD  SVG to diagonal 1  SVG to PL (RCA)  4.   Endoscopic vein harvest from the right leg 5.   Clipping of left atrial appendage   Anesthesia:  General Endotracheal   Clinical History/Surgical Indication:  This 80 year old gentleman has severe multivessel coronary disease with a long calcified high-grade stenosis in the proximal to mid LAD as well as a high-grade distal right coronary artery stenosis compromising a moderate sized posterior lateral branch. He denies any symptoms whatsoever but with diabetes and a reduced ejection fraction he could be having silent ischemia. He is not felt to be a good PCI candidate given the length and calcification of the stenosis in the proximal to mid LAD. I think his vessels are reasonable for coronary bypass graft surgery and that may prevent further left ventricular deterioration. 2D echo shows moderate LV systolic dysfunction with an EF of 35-40%. There is no significant valvular disease.  He has rate controlled atrial fibrillation with left bundle branch block morphology. Given his age and increased operative risk and the fact that his atrial fibrillation is rate controlled and he is asymptomatic I do not think a Maze procedure is indicated.I would plan to place a clip on his left atrial appendage. He has a high grade right ICA stenosis with mixed plaque on CTA of the neck and given his recent TIA symptoms in Feb that sent him to [facility] Dr. Carlis Abbott is planning  right CEA at the same time as CABG.  Preparation:  The patient was seen in the preoperative holding area and the correct patient, correct operation were confirmed with the patient after reviewing the medical record and catheterization. The consent was signed by me. Preoperative antibiotics were given. A pulmonary arterial line and radial arterial line were placed by the anesthesia team. The patient was taken back to the operating room and positioned supine on the operating room table. After being placed under general endotracheal anesthesia by the anesthesia team a foley catheter was placed. The neck, chest, abdomen, and both legs were prepped with betadine soap and solution and draped in the usual sterile manner.   The right carotid endarterectomy was performed first by Dr. Carlis Abbott and the wound was packed with a lap pad and closed loosely.  Cardiac Procedure:  A surgical time-out was taken and the correct patient and operative procedure were confirmed with the nursing and anesthesia staff.   Cardiopulmonary Bypass:  A median sternotomy was performed. The pericardium was opened in the midline. Right ventricular function appeared normal. The ascending aorta was of normal size and had no palpable plaque. There were no contraindications to aortic cannulation or cross-clamping. The patient was fully systemically heparinized and the ACT was maintained > 400 sec. The proximal aortic arch was cannulated with a 70 F aortic cannula for arterial inflow. Venous cannulation was performed via the right atrial appendage using a two-staged venous cannula. An antegrade cardioplegia/vent cannula was inserted into the mid-ascending aorta. Aortic occlusion was performed with a single cross-clamp. Systemic cooling to 32 degrees Centigrade and topical cooling of the heart with iced saline were used. Hyperkalemic antegrade cold blood  cardioplegia was used to induce diastolic arrest and was then given at about 20 minute  intervals throughout the period of arrest to maintain myocardial temperature at or below 10 degrees centigrade. A temperature probe was inserted into the interventricular septum and an insulating pad was placed in the pericardium.   Left internal mammary artery harvest:  The left side of the sternum was retracted using the Rultract retractor. The left internal mammary artery was harvested as a pedicle graft. All side branches were clipped. It was a medium-sized vessel of good quality with excellent blood flow. It was ligated distally and divided. It was sprayed with topical papaverine solution to prevent vasospasm.   Endoscopic vein harvest:  The right greater saphenous vein was harvested endoscopically through a 2 cm incision medial to the right knee. It was harvested from the upper thigh to below the knee. It was a medium-sized vein of good quality. The side branches were all ligated with 4-0 silk ties.    Coronary arteries:  The coronary arteries were examined.   LAD:  Large vessel in mid portion, smaller distally with no disease. Diagonal 1 was a moderate sized vessel with ostial stenosis but no distal disease.  LCX:  No stenosis on cath  RCA:  PL was small but graftable with no distal disease.   Grafts:  1. LIMA to the LAD: 1.75 mm distally. It was sewn end to side using 8-0 prolene continuous suture. 2. SVG to diagonal 1:  1.6 mm. It was sewn end to side using 7-0 prolene continuous suture. 3. SVG to PL (RCA):  1.5 mm. It was sewn end to side using 7-0 prolene continuous suture.   The proximal vein graft anastomoses were performed to the mid-ascending aorta using continuous 6-0 prolene suture. Graft markers were placed around the proximal anastomoses.   Ligation of left atrial appendage:   The base of the appendage was measured and a 45 mm Atricure Atriclip Pro 2 was chosen. This was placed across the base of the LAA without difficulty.      Completion:  The patient  was rewarmed to 37 degrees Centigrade. The clamp was removed from the LIMA pedicle and there was rapid warming of the septum and return of ventricular fibrillation. The crossclamp was removed with a time of 69 minutes. There was spontaneous return of sinus rhythm. The distal and proximal anastomoses were checked for hemostasis. The position of the grafts was satisfactory. Two temporary epicardial pacing wires were placed on the right atrium and two on the right ventricle. The patient was weaned from CPB without difficulty on no inotropes. CPB time was 85 minutes. Cardiac output was 5 LPM. TEE showed normal LV systolic function. Heparin was fully reversed with protamine and the aortic and venous cannulas removed. Hemostasis was achieved. Mediastinal and left pleural drainage tubes were placed. The sternum was closed with double #6 stainless steel wires. The fascia was closed with continuous # 1 vicryl suture. The subcutaneous tissue was closed with 2-0 vicryl continuous suture. The skin was closed with 3-0 vicryl subcuticular suture. All sponge, needle, and instrument counts were reported correct at the end of the case. Dry sterile dressings were placed over the incisions and around the chest tubes which were connected to pleurevac suction. The patient was then transported to the surgical intensive care unit in stable condition.

## 2020-10-12 NOTE — Progress Notes (Signed)
EVENING ROUNDS NOTE :     [address]       [place] [address]             [phone]                 Day of Surgery Procedure(s) (LRB): CORONARY ARTERY BYPASS GRAFTING (CABG) X TWO USING LEFT INTERNAL MAMMARY ARTERY AND RIGHT ENDOSCOPIC SAPHENOUS VEIN HARVEST CONDUIT (N/A) CLIPPING OF ATRIAL APPENDAGE (N/A) TRANSESOPHAGEAL ECHOCARDIOGRAM (TEE) (N/A) ENDARTERECTOMY CAROTID USING XENOSURE BIOLOGIC PATCH 1CM X 6CM (Right)   Total Length of Stay:  LOS: 0 days  Events:   Slow to wake up On 10 of neo Minimal CT output    BP (!) 105/59   Pulse 79   Temp (!) 97.52 F (36.4 C)   Resp (!) 21   Ht 6\' 1"  (1.854 m)   Wt 106.1 kg   SpO2 100%   BMI 30.86 kg/m   PAP: (26-39)/(15-21) 26/15 CO:  [3.5 L/min-4.3 L/min] 4 L/min CI:  [1.5 L/min/m2-1.9 L/min/m2] 1.8 L/min/m2  Vent Mode: SIMV;PSV;PRVC FiO2 (%):  [40 %-50 %] 50 % Set Rate:  [4 bmp-12 bmp] 12 bmp Vt Set:  [630 mL] 630 mL PEEP:  [5 cmH20] 5 cmH20 Pressure Support:  [10 cmH20] 10 cmH20 Plateau Pressure:  [18 cmH20] 18 cmH20  . sodium chloride    . [START ON 10/13/2020] sodium chloride    . sodium chloride    . albumin human 12.5 g (10/12/20 1711)  . cefUROXime (ZINACEF)  IV 1.5 g (10/12/20 1634)  . dexmedetomidine (PRECEDEX) IV infusion 0.4 mcg/kg/hr (10/12/20 1600)  . famotidine (PEPCID) IV Stopped (10/12/20 1548)  . insulin Stopped (10/12/20 1452)  . lactated ringers    . lactated ringers    . lactated ringers 20 mL/hr at 10/12/20 1600  . magnesium sulfate 20 mL/hr at 10/12/20 1600  . milrinone    . nitroGLYCERIN    . nitroGLYCERIN Stopped (10/12/20 1440)  . norepinephrine    . phenylephrine (NEO-SYNEPHRINE) Adult infusion 5 mcg/min (10/12/20 1440)  . potassium chloride    . vancomycin      No intake/output data recorded.   CBC Latest Ref Rng & Units 10/12/2020 10/12/2020 10/12/2020  WBC 4.0 - 10.5 K/uL 8.8 - -  Hemoglobin 13.0 - 17.0 g/dL 10.9(L) 8.8(L) 9.2(L)  Hematocrit 39.0 - 52.0 %  34.1(L) 26.0(L) 27.0(L)  Platelets 150 - 400 K/uL 118(L) - -    BMP Latest Ref Rng & Units 10/12/2020 10/12/2020 10/12/2020  Glucose 70 - 99 mg/dL 111(H) - 82  BUN 8 - 23 mg/dL 20 - 20  Creatinine 0.61 - 1.24 mg/dL 0.80 - 0.70  BUN/Creat Ratio 10 - 24 - - -  Sodium 135 - 145 mmol/L 137 137 139  Potassium 3.5 - 5.1 mmol/L 4.6 4.9 4.5  Chloride 98 - 111 mmol/L 107 - 104  CO2 22 - 32 mmol/L - - -  Calcium 8.9 - 10.3 mg/dL - - -    ABG    Component Value Date/Time   PHART 7.387 10/12/2020 1255   PCO2ART 38.5 10/12/2020 1255   PO2ART 301 (H) 10/12/2020 1255   HCO3 23.1 10/12/2020 1255   TCO2 22 10/12/2020 1314   ACIDBASEDEF 2.0 10/12/2020 1255   O2SAT 100.0 10/12/2020 1255       Melodie Bouillon, MD 10/12/2020 5:40 PM

## 2020-10-12 NOTE — Anesthesia Procedure Notes (Signed)
Procedure Name: Intubation Date/Time: 10/12/2020 7:49 AM Performed by: Betha Loa, CRNA Pre-anesthesia Checklist: Patient identified, Emergency Drugs available, Suction available and Patient being monitored Patient Re-evaluated:Patient Re-evaluated prior to induction Oxygen Delivery Method: Circle System Utilized Preoxygenation: Pre-oxygenation with 100% oxygen Induction Type: IV induction Ventilation: Two handed mask ventilation required and Oral airway inserted - appropriate to patient size Laryngoscope Size: Mac and 4 Grade View: Grade II Tube type: Oral Tube size: 8.0 mm Number of attempts: 1 Airway Equipment and Method: Stylet and Oral airway Placement Confirmation: ETT inserted through vocal cords under direct vision,  positive ETCO2 and breath sounds checked- equal and bilateral Secured at: 22 cm Tube secured with: Tape Dental Injury: Teeth and Oropharynx as per pre-operative assessment  Difficulty Due To: Difficult Airway- due to reduced neck mobility, Difficult Airway- due to large tongue and Difficulty was anticipated Comments: MP 4, limited ROM with head/neck. Glidescope on standby

## 2020-10-12 NOTE — H&P (Signed)
History and Physical Interval Note:  10/12/2020 7:31 AM  Thomas Mckee  has presented today for surgery, with the diagnosis of CAD.  The various methods of treatment have been discussed with the patient and family. After consideration of risks, benefits and other options for treatment, the patient has consented to  Procedure(s): CORONARY ARTERY BYPASS GRAFTING (CABG) (N/A) CLIPPING OF ATRIAL APPENDAGE (N/A) TRANSESOPHAGEAL ECHOCARDIOGRAM (TEE) (N/A) ENDARTERECTOMY CAROTID (Right) as a surgical intervention.  The patient's history has been reviewed, patient examined, no change in status, stable for surgery.  I have reviewed the patient's chart and labs.  Questions were answered to the patient's satisfaction.    Plan right carotid endarterectomy for 80% stenosis with concern for symptomatic lesion given recent TIA.  Risks and benefits discussed including perioperative stroke.  Thomas Mckee  Patient name: Thomas Mckee           MRN: [id number]        DOB: 1940/11/19          Sex: male  REASON FOR CONSULT: Evaluate carotid artery stenosis  HPI: Thomas Mckee is a 80 y.o. male, with history of atrial fibrillation, stage III CKD, hypertension, hyperlipidemia, CHF that presents for evaluation of carotid stenosis.  Patient states that back in the beginning of February he was at home when he was trying to call his brother and had difficulty with completing the task and could not remember.  Apparently had some slurred speech associated with the event.  He denies any focal weakness in arm or leg.  No vision loss.  Ultimately was taken to [facility].  CTA neck there showed mixed density plaque in the right carotid bifurcation with greater than 80% stenosis.  He states he cannot do an MRI because he had spinal stimulator.  He is now on Eliquis for his A. fib.  He is currently scheduled for coronary artery bypass grafting and clipping of his left atrial appendage after evaluation by Dr. Cyndia Bent  for multivessel coronary disease.  He denies any active chest pain or shortness of breath at this time.  He has had no additional focal neurologic events since this event last month.  No head or neck radiation or surgery.       Past Medical History:  Diagnosis Date  . Abnormal echocardiogram 08/13/2020  . Abnormal ventricular wall motion 08/13/2020  . Atrial fibrillation ([place]) 08/13/2020  . Carotid stenosis   . Cerebral arteriosclerosis with history of previous cerebrovascular accident   . Chronic kidney disease, stage 3 ([place])   . DDD (degenerative disc disease), lumbar 02/28/2019   Last Assessment & Plan:  Formatting of this note might be different from the original. See status post lumbar laminectomy plan  . HFrEF (heart failure with reduced ejection fraction) ([place]) 08/13/2020  . History of CVA (cerebrovascular accident) 08/13/2020  . LBBB (left bundle branch block) 08/13/2020  . Left ventricular dysfunction   . Lumbar radiculopathy 02/28/2019   Last Assessment & Plan:  Formatting of this note might be different from the original. Continue 800 mg gabapentin t.i.d..  Unable to obtain Lyrica due to cost  . Mixed hyperlipidemia 08/13/2020  . Neck pain 02/28/2019   Last Assessment & Plan:  Formatting of this note might be different from the original. Chronic neck pain with radiation to the bilateral shoulders, left greater than right.  He reports having plain films completed by chiropractor do not have access to these.  Will consider updating imaging for possible procedural consideration of persistent symptoms.  Marland Kitchen  Other chronic pain 02/28/2019  . Pain management contract agreement 06/27/2019   Last Assessment & Plan:  Formatting of this note might be different from the original. Prior UDS results are consistent with regimen, [place] controlled substance registry is checked and is also consistent.  . Primary hypertension 08/13/2020  . S/P insertion of spinal cord stimulator 02/28/2019    Last Assessment & Plan:  Formatting of this note might be different from the original. He has established with [employer] rep who is helping him to make adjustments to his programming accordingly.  He needs to follow-up with them as directed  . Status post lumbar laminectomy 02/28/2019   Last Assessment & Plan:  Formatting of this note is different from the original. 80 year old male with chronic low back and bilateral lower extremity radicular symptoms status post lumbar laminectomy with subsequent spinal cord stimulator placement. Recently updated plain films of the thoracic and lumbar spine show moderate lower lumbar facet arthrosis with indwellingThoracic stimulatorleadsce  . Systolic CHF ([place])   . Type 2 diabetes mellitus with complication, with long-term current use of insulin ([place]) 08/13/2020         Past Surgical History:  Procedure Laterality Date  . BACK SURGERY    . LEFT HEART CATH AND CORONARY ANGIOGRAPHY N/A 08/18/2020   Procedure: LEFT HEART CATH AND CORONARY ANGIOGRAPHY;  Surgeon: Jettie Booze, MD;  Location: [facility];  Service: Cardiovascular;  Laterality: N/A;  . REPLACEMENT TOTAL KNEE Right   . SHOULDER SURGERY           Family History  Problem Relation Age of Onset  . Diabetes Father   . Diabetes Sister   . Cancer Brother     SOCIAL HISTORY: Social History        Socioeconomic History  . Marital status: Divorced    Spouse name: Not on file  . Number of children: Not on file  . Years of education: Not on file  . Highest education level: Not on file  Occupational History  . Not on file  Tobacco Use  . Smoking status: Former Research scientist (life sciences)  . Smokeless tobacco: Never Used  Substance and Sexual Activity  . Alcohol use: Not on file  . Drug use: Never  . Sexual activity: Not on file  Other Topics Concern  . Not on file  Social History Narrative  . Not on file   Social Determinants of Health   Financial Resource Strain: Not on  file  Food Insecurity: Not on file  Transportation Needs: Not on file  Physical Activity: Not on file  Stress: Not on file  Social Connections: Not on file  Intimate Partner Violence: Not on file         Allergies  Allergen Reactions  . Morphine Other (See Comments)    Other reaction(s): Other (see comments) Makes pt dizzy and lightheaded Makes pt dizzy and lightheaded Other reaction(s): Other (see comments) Makes pt dizzy and lightheaded          Current Outpatient Medications  Medication Sig Dispense Refill  . apixaban (ELIQUIS) 5 MG TABS tablet Take 5 mg by mouth in the morning and at bedtime.    Marland Kitchen aspirin EC 81 MG tablet Take 81 mg by mouth daily. Swallow whole.    . carvedilol (COREG) 3.125 MG tablet Take 3.125 mg by mouth 2 (two) times daily with a meal.    . clopidogrel (PLAVIX) 75 MG tablet Take 75 mg by mouth daily.    Marland Kitchen  DULoxetine (CYMBALTA) 30 MG capsule Take 30 mg by mouth daily.    . empagliflozin (JARDIANCE) 25 MG TABS tablet Take 25 mg by mouth daily.    Marland Kitchen gabapentin (NEURONTIN) 800 MG tablet Take 800 mg by mouth 3 (three) times daily.    Marland Kitchen HYDROcodone-acetaminophen (NORCO/VICODIN) 5-325 MG tablet Take 1 tablet by mouth every 6 (six) hours as needed for moderate pain.    Marland Kitchen LANTUS SOLOSTAR 100 UNIT/ML Solostar Pen Inject 44 Units into the skin at bedtime.    Marland Kitchen lisinopril (ZESTRIL) 5 MG tablet Take 5 mg by mouth daily.    . metFORMIN (GLUCOPHAGE) 1000 MG tablet Take 1,000 mg by mouth 2 (two) times daily with a meal.    . oxymetazoline (NASAL SPRAY 12 HOUR) 0.05 % nasal spray Place 1 spray into both nostrils daily as needed for congestion.    Marland Kitchen OZEMPIC, 0.25 OR 0.5 MG/DOSE, 2 MG/1.5ML SOPN Inject 1 mg as directed once a week.    . simvastatin (ZOCOR) 80 MG tablet Take 80 mg by mouth at bedtime.    . naproxen (NAPROSYN) 500 MG tablet Take 500 mg by mouth 2 (two) times daily. (Patient not taking: Reported on 09/08/2020)     No  current facility-administered medications for this visit.    REVIEW OF SYSTEMS:  [X]  denotes positive finding, [ ]  denotes negative finding Cardiac  Comments:  Chest pain or chest pressure:    Shortness of breath upon exertion:    Short of breath when lying flat:    Irregular heart rhythm:        Vascular    Pain in calf, thigh, or hip brought on by ambulation:    Pain in feet at night that wakes you up from your sleep:     Blood clot in your veins:    Leg swelling:         Pulmonary    Oxygen at home:    Productive cough:     Wheezing:         Neurologic    Sudden weakness in arms or legs:     Sudden numbness in arms or legs:     Sudden onset of difficulty speaking or slurred speech:    Temporary loss of vision in one eye:     Problems with dizziness:         Gastrointestinal    Blood in stool:     Vomited blood:         Genitourinary    Burning when urinating:     Blood in urine:        Psychiatric    Major depression:         Hematologic    Bleeding problems:    Problems with blood clotting too easily:        Skin    Rashes or ulcers:        Constitutional    Fever or chills:      PHYSICAL EXAM:     Vitals:   09/08/20 1408 09/08/20 1411  BP: 132/82 116/76  Pulse: 95 95  Resp: 18   Temp: (!) 97.3 F (36.3 C)   TempSrc: Temporal   SpO2: 97%   Weight: 232 lb (105.2 kg)   Height: 6\' 1"  (1.854 m)     GENERAL: The patient is a well-nourished male, in no acute distress. The vital signs are documented above. CARDIAC: There is a regular rate and rhythm.  VASCULAR:  Good range of motion  in the neck Palpable radial and femoral pulses bilaterally PULMONARY: There is good air exchange bilaterally without wheezing or rales. ABDOMEN: Soft and non-tender. MUSCULOSKELETAL: There are no major deformities or cyanosis. NEUROLOGIC: No focal weakness or  paresthesias are detected.  Cranial nerves II through XII grossly intact. SKIN: There are no ulcers or rashes noted. PSYCHIATRIC: The patient has a normal affect.  DATA:   CLINICAL DATA: CVA workup.  EXAM: CT ANGIOGRAPHY HEAD AND NECK  TECHNIQUE: Multidetector CT imaging of the head and neck was performed using the standard protocol during bolus administration of intravenous contrast. Multiplanar CT image reconstructions and MIPs were obtained to evaluate the vascular anatomy. Carotid stenosis measurements (when applicable) are obtained utilizing NASCET criteria, using the distal internal carotid diameter as the denominator.  COMPARISON: Head CT August 01, 2020.  FINDINGS: CT HEAD FINDINGS  Brain: No evidence of acute infarction, hemorrhage, hydrocephalus, extra-axial collection or mass lesion/mass effect. 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  Vascular: No hyperdense vessel. Calcified plaques in the bilateral carotid siphons.  Skull: Normal. Negative for fracture or focal lesion.  Sinuses: Imaged portions are clear.  Orbits: No acute finding.  Review of the MIP images confirms the above findings  CTA NECK FINDINGS  Aortic arch: Standard branching. Imaged portion shows no evidence of aneurysm or dissection. Calcified plaques in the aortic arch and at the origin of the major arch arteries without hemodynamically significant stenosis.  Right carotid system: Mixed density plaque at the right carotid bifurcation resulting 75-80% stenosis.  Left carotid system: Calcified plaque at the left carotid bifurcation without hemodynamically significant stenosis.  Vertebral arteries: The right vertebral artery is dominant. Calcified plaque is seen in the right P1 segment resulting in mild stenosis. Mixed density plaque at the origin of the left vertebral artery resulting in his severe stenosis.  Skeleton: No  aggressive lesion identified.  Other neck: Negative.  Upper chest: Negative.  Review of the MIP images confirms the above findings  CTA HEAD FINDINGS  Anterior circulation: No significant stenosis, proximal occlusion, aneurysm, or vascular malformation.  Posterior circulation: No significant stenosis, proximal occlusion, aneurysm, or vascular malformation.  Venous sinuses: As permitted by contrast timing, patent.  Anatomic variants: Right fetal PCA.  Review of the MIP images confirms the above findings  IMPRESSION: 1. Mixed density plaque at the right carotid bifurcation resulting in 75-80% stenosis. 2. Mixed density plaque at the origin of the left vertebral artery resulting in severe stenosis. 3. No significant intracranial stenosis, occlusion, aneurysm or vascular malformation  Aortic Atherosclerosis (ICD10-I70.0).   Electronically Signed By: Pedro Earls M.D. On: 08/03/2020 13:58  Assessment/Plan:   80 year old male with multiple medical comorbidities as noted above who presents for evaluation of carotid artery disease.  His event in February that sent him to the [facility] certainly sounds like a TIA based on his description.  He could not get an MRI but his CTA neck did show a high-grade stenosis in the right ICA with mixed plaque.  I agree that this event could have been related to his A. fib given he was not anticoagulated but also may be related to his carotid artery disease given the extent of diseas.  I discussed with him today that I do think he would benefit from right carotid endarterectomy after review of his imaging.  We discussed risk and benefits of surgery.  I need to discuss his case with Dr. Cyndia Bent with CT surgery to see what his  thoughts are regarding timing of surgery and or combined carotid/CABG.  I will get in touch with the patient and let him know accordingly.  Thomas Heck, MD [facility] [place] Office: [phone]

## 2020-10-12 NOTE — Progress Notes (Signed)
After performing extubation perimeters (VC- 2.0L and NIF- -25cmH2o),  Pt extubated to 4LPM [place] at this time.  Pt able to speak and tolerated procedure well.  Pt VS stable and RN at bedside when RT left.  RT will continue to monitor.

## 2020-10-12 NOTE — Transfer of Care (Signed)
Immediate Anesthesia Transfer of Care Note  Patient: Thomas Mckee  Procedure(s) Performed: CORONARY ARTERY BYPASS GRAFTING (CABG) X TWO USING LEFT INTERNAL MAMMARY ARTERY AND RIGHT ENDOSCOPIC SAPHENOUS VEIN HARVEST CONDUIT (N/A Chest) CLIPPING OF ATRIAL APPENDAGE (N/A ) TRANSESOPHAGEAL ECHOCARDIOGRAM (TEE) (N/A ) ENDARTERECTOMY CAROTID USING XENOSURE BIOLOGIC PATCH 1CM X 6CM (Right )  Patient Location: SICU  Anesthesia Type:General  Level of Consciousness: sedated and Patient remains intubated per anesthesia plan  Airway & Oxygen Therapy: Patient remains intubated per anesthesia plan and Patient placed on Ventilator (see vital sign flow sheet for setting)  Post-op Assessment: Report given to RN and Post -op Vital signs reviewed and stable  Post vital signs: Reviewed and stable  Last Vitals:  Vitals Value Taken Time  BP 88/52 10/12/20 1445  Temp 36.3 C 10/12/20 1457  Pulse 80 10/12/20 1457  Resp 14 10/12/20 1457  SpO2 100 % 10/12/20 1457  Vitals shown include unvalidated device data.  Last Pain:  Vitals:   10/12/20 0601  TempSrc: Oral  PainSc:          Complications: No complications documented.

## 2020-10-12 NOTE — Anesthesia Procedure Notes (Signed)
Arterial Line Insertion Start/End4/25/2022 6:40 AM, 10/12/2020 6:45 AM Performed by: Betha Loa, CRNA, CRNA  Patient location: Pre-op. Preanesthetic checklist: patient identified, IV checked, site marked, risks and benefits discussed, surgical consent, monitors and equipment checked, pre-op evaluation, timeout performed and anesthesia consent Lidocaine 1% used for infiltration Left, radial was placed Catheter size: 20 G Hand hygiene performed   Attempts: 1 Procedure performed without using ultrasound guided technique. Following insertion, Biopatch and dressing applied. Post procedure assessment: normal  Patient tolerated the procedure well with no immediate complications.

## 2020-10-12 NOTE — Op Note (Addendum)
OPERATIVE NOTE  PROCEDURE:   1.  right carotid endarterectomy with bovine patch angioplasty 2.  right intraoperative carotid ultrasound  PRE-OPERATIVE DIAGNOSIS: right symptomatic carotid stenosis >80%  POST-OPERATIVE DIAGNOSIS: same as above   SURGEON: [name], MD  ASSISTANT(S): Leontine Locket, PA  ANESTHESIA: general  ESTIMATED BLOOD LOSS: <50 mL  FINDING(S): 1.  High grade ulcerated right internal carotid artery plaque. 2.  Continuous Doppler audible flow signatures are  appropriate for each carotid artery after endarterectomy and patch angioplasty. 3.  No evidence of intimal flap visualized on transverse or longitudinal ultrasonography.   SPECIMEN(S):  Carotid plaque  INDICATIONS:   Thomas Mckee is a 80 y.o. male who presents with right symptomatic carotid stenosis >80% with recent TIA event in February.  Patient is scheduled for CABG with Dr. Cyndia Bent and we discussed combined carotid/CABG given concern for symptomatic lesion and recent TIA event.  I discussed with the patient the risks, benefits, and alternatives to carotid endarterectomy.   I discussed the procedural details of carotid endarterectomy with the patient.  The patient is aware that the risks of carotid endarterectomy include but are not limited to: bleeding, infection, stroke, myocardial infarction, death, cranial nerve injuries both temporary and permanent, neck hematoma, possible airway compromise, labile blood pressure post-operatively, cerebral hyperperfusion syndrome, and possible need for additional interventions in the future. The patient is aware of the risks and agrees to proceed forward with the procedure.  An assistant was needed for exposure and to expedite the case.  DESCRIPTION: After full informed written consent was obtained from the patient, the patient was brought back to the operating room and placed supine upon the operating table.  Prior to induction, the patient received IV  antibiotics.  After obtaining adequate anesthesia, the patient was placed into semi-Fowler position with a shoulder roll in place and the patient's neck slightly hyperextended and rotated away from the surgical site.  The patient was prepped in the standard fashion for a right carotid endarterectomy.  I made an incision anterior to the sternocleidomastoid muscle and dissected down through the subcutaneous tissue.  The platysma was opened with electrocautery.  I then used Bovie cautery and blunt dissection to dissect through the underlying platysma and to mobilize the anterior border of the sternocleidomastoid as well as the internal jugular vein laterally.  The facial vein was ligated with 3-0 silk and divided.  After identifying the carotid artery I used Metzenbaum scissors to bluntly dissect the common carotid artery and then controlled this with a large vessel loop.  At this point in time the patient was given 100 units/kg of IV heparin and we checked an ACT to ensure it was greater than 250.  I then carried my dissection cephalad and mobilized the external carotid artery and superior thyroid artery and controlled each of these with a vessel loop.  The ansa was transected.  I then dissected out the internal carotid artery well past the distal plaque.  I was careful to identify the vagus nerve between the internal jugular and common carotid and this was presereved.  I was also careful to identify and preserve the hypoglossal nerve and this was preserved.    Once our ACT was confirmed, I proceeded by clamping the internal carotid artery with a Kitzmiller clamp first.  The proximal common carotid artery was controlled with a angled debakey clamp.  The external carotid was controlled with a vessel loop.  I subsequently opened the common carotid artery with an 39  blade scalpel in longitudinal fashion and extended the arteriotomy with Potts scissors onto the ICA past the distal plaque.  I elected not to place a shunt  given tortuosity in the distal ICA.  I then used a Garment/textile technologist and performed a endarterectomy starting in the common carotid artery.  The external carotid artery was endarterectomized with an eversion technique and I was careful to feather the distal ICA plaque.  The specimen was passed off the field.  The endarterectomy site was then flushed with heparinized saline and I was careful to ensure there were no flaps in the endarterectomy site.  I then brought a bovine carotid patch on the field and this was sewn in place with a running anastomosis using a 6-0 Prolene distally and a 5-0 proximal.  The bovine patch was trimmed accordingly.   The artery was flushed antegrade and retrograde prior to completion of the patch.  Once the patch was complete, I flushed up the external carotid artery first prior to releasing the internal carotid artery clamp.  An intraoperative duplex was performed that showed no evidence of any flaps.  A doppler also confirmed good flow in the ICA and ECA.  Given the patient is now having coronary artery bypass grafting we did not reverse heparin.  I did have to place several 5-0 and 6-0 Prolene's in the patch for hemostasis.  Ultimately we irrigated out the wound bed and appeared to have good hemostasis.  A lap pad was placed in the surgical wound and I reapproximated the skin over this with multiple 3-0 nylon's.  Case was turned over to Dr. Cyndia Bent.  He will plan to remove the lap pad and close the surgical wound at completion of coronary bypass grafting.    COMPLICATIONS: None  CONDITION: Stable  [name], MD [facility] Office: [place]   10/12/2020, 10:03 AM

## 2020-10-12 NOTE — Anesthesia Procedure Notes (Signed)
Central Venous Catheter Insertion Performed by: Murvin Natal, MD, anesthesiologist Start/End4/25/2022 6:40 AM, 10/12/2020 7:00 AM Patient location: Pre-op. Preanesthetic checklist: patient identified, IV checked, site marked, risks and benefits discussed, surgical consent, monitors and equipment checked, pre-op evaluation, timeout performed and anesthesia consent Position: Trendelenburg Lidocaine 1% used for infiltration and patient sedated Hand hygiene performed , maximum sterile barriers used  and Seldinger technique used Catheter size: 8.5 Fr Total catheter length 10. PA cath was placed.Sheath introducer Swan type:thermodilution PA Cath depth:45 Procedure performed using ultrasound guided technique. Ultrasound Notes:anatomy identified, needle tip was noted to be adjacent to the nerve/plexus identified and no ultrasound evidence of intravascular and/or intraneural injection Attempts: 1 Following insertion, line sutured, dressing applied and Biopatch. Post procedure assessment: no air and free fluid flow  Patient tolerated the procedure well with no immediate complications.

## 2020-10-12 NOTE — Brief Op Note (Signed)
10/12/2020  12:35 PM  PATIENT:  Thomas Mckee  80 y.o. male  PRE-OPERATIVE DIAGNOSIS:  Coronary Artery Disease, Right Carotid Stenosis  POST-OPERATIVE DIAGNOSIS:  Coronary Artery Disease, Right Carotid Stenosis  PROCEDURES:   CORONARY ARTERY BYPASS GRAFTING X THREE USING LEFT INTERNAL MAMMARY ARTERY AND RIGHT ENDOSCOPIC SAPHENOUS VEIN HARVEST CONDUIT   LIMA->LAD  SVG->D1  SVG->PLB of RCA  CLIPPING OF LEFT ATRIAL APPENDAGE TRANSESOPHAGEAL ECHOCARDIOGRAM   RIGHT CAROTID ENDARTERECTOMY USING XENOSURE BIOLOGIC PATCH 1CM X 6CM (Dr. Carlis Abbott)  SURGEONS:  Panel 1:Bartle, Fernande Boyden, MD - Primary              Panel 2:Clark, Gwenyth Allegra, MD - Primary  PHYSICIAN ASSISTANT: Chun Sellen   ASSISTANTS: Staff RNFA  ANESTHESIA:   general  EBL:  Per perfusion and anesthesia records  BLOOD ADMINISTERED:none  DRAINS: Left pleural and mediastinal drains   LOCAL MEDICATIONS USED:  NONE  SPECIMEN:  No Specimen  DISPOSITION OF SPECIMEN:  N/A  COUNTS:  YES  DICTATION: .Dragon Dictation  PLAN OF CARE: Admit to inpatient   PATIENT DISPOSITION:  ICU - intubated and hemodynamically stable.   Delay start of Pharmacological VTE agent (>24hrs) due to surgical blood loss or risk of bleeding: yes

## 2020-10-13 ENCOUNTER — Encounter (HOSPITAL_COMMUNITY): Payer: Self-pay | Admitting: Surgery

## 2020-10-13 ENCOUNTER — Inpatient Hospital Stay (HOSPITAL_COMMUNITY): Payer: HMO

## 2020-10-13 LAB — CBC
HCT: 29 % — ABNORMAL LOW (ref 39.0–52.0)
HCT: 31.2 % — ABNORMAL LOW (ref 39.0–52.0)
Hemoglobin: 9.5 g/dL — ABNORMAL LOW (ref 13.0–17.0)
Hemoglobin: 9.8 g/dL — ABNORMAL LOW (ref 13.0–17.0)
MCH: 30.3 pg (ref 26.0–34.0)
MCH: 29.7 pg (ref 26.0–34.0)
MCHC: 32.8 g/dL (ref 30.0–36.0)
MCHC: 31.4 g/dL (ref 30.0–36.0)
MCV: 92.4 fL (ref 80.0–100.0)
MCV: 94.5 fL (ref 80.0–100.0)
Platelets: UNDETERMINED 10*3/uL (ref 150–400)
Platelets: 126 10*3/uL — ABNORMAL LOW (ref 150–400)
RBC: 3.14 MIL/uL — ABNORMAL LOW (ref 4.22–5.81)
RBC: 3.3 MIL/uL — ABNORMAL LOW (ref 4.22–5.81)
RDW: 14.5 % (ref 11.5–15.5)
RDW: 14.7 % (ref 11.5–15.5)
WBC: 10.6 10*3/uL — ABNORMAL HIGH (ref 4.0–10.5)
WBC: 12.1 10*3/uL — ABNORMAL HIGH (ref 4.0–10.5)
nRBC: 0 % (ref 0.0–0.2)
nRBC: 0 % (ref 0.0–0.2)

## 2020-10-13 LAB — GLUCOSE, CAPILLARY
Glucose-Capillary: 106 mg/dL — ABNORMAL HIGH (ref 70–99)
Glucose-Capillary: 111 mg/dL — ABNORMAL HIGH (ref 70–99)
Glucose-Capillary: 121 mg/dL — ABNORMAL HIGH (ref 70–99)
Glucose-Capillary: 126 mg/dL — ABNORMAL HIGH (ref 70–99)
Glucose-Capillary: 127 mg/dL — ABNORMAL HIGH (ref 70–99)
Glucose-Capillary: 149 mg/dL — ABNORMAL HIGH (ref 70–99)
Glucose-Capillary: 186 mg/dL — ABNORMAL HIGH (ref 70–99)
Glucose-Capillary: 188 mg/dL — ABNORMAL HIGH (ref 70–99)
Glucose-Capillary: 211 mg/dL — ABNORMAL HIGH (ref 70–99)
Glucose-Capillary: 237 mg/dL — ABNORMAL HIGH (ref 70–99)
Glucose-Capillary: 243 mg/dL — ABNORMAL HIGH (ref 70–99)
Glucose-Capillary: 85 mg/dL (ref 70–99)

## 2020-10-13 LAB — BASIC METABOLIC PANEL
Anion gap: 8 (ref 5–15)
Anion gap: 7 (ref 5–15)
BUN: 19 mg/dL (ref 8–23)
BUN: 21 mg/dL (ref 8–23)
CO2: 21 mmol/L — ABNORMAL LOW (ref 22–32)
CO2: 24 mmol/L (ref 22–32)
Calcium: 8.1 mg/dL — ABNORMAL LOW (ref 8.9–10.3)
Calcium: 8 mg/dL — ABNORMAL LOW (ref 8.9–10.3)
Chloride: 108 mmol/L (ref 98–111)
Chloride: 101 mmol/L (ref 98–111)
Creatinine, Ser: 1.06 mg/dL (ref 0.61–1.24)
Creatinine, Ser: 1.26 mg/dL — ABNORMAL HIGH (ref 0.61–1.24)
GFR, Estimated: >60 mL/min (ref >60)
GFR, Estimated: 58 mL/min — ABNORMAL LOW (ref >60)
Glucose, Bld: 118 mg/dL — ABNORMAL HIGH (ref 70–99)
Glucose, Bld: 253 mg/dL — ABNORMAL HIGH (ref 70–99)
Potassium: 4.2 mmol/L (ref 3.5–5.1)
Potassium: 4 mmol/L (ref 3.5–5.1)
Sodium: 137 mmol/L (ref 135–145)
Sodium: 132 mmol/L — ABNORMAL LOW (ref 135–145)

## 2020-10-13 LAB — MAGNESIUM
Magnesium: 2.1 mg/dL (ref 1.7–2.4)
Magnesium: 2.2 mg/dL (ref 1.7–2.4)

## 2020-10-13 MED ORDER — FUROSEMIDE 10 MG/ML IJ SOLN
40.0000 mg | Freq: Once | INTRAMUSCULAR | Status: AC
  Administered 2020-10-13: 40 mg via INTRAVENOUS
  Filled 2020-10-13: qty 4

## 2020-10-13 MED ORDER — INSULIN DETEMIR 100 UNIT/ML ~~LOC~~ SOLN
30.0000 [IU] | Freq: Every day | SUBCUTANEOUS | Status: DC
  Filled 2020-10-13: qty 0.3

## 2020-10-13 MED ORDER — SODIUM CHLORIDE 0.9% FLUSH
10.0000 mL | Freq: Two times a day (BID) | INTRAVENOUS | Status: DC
  Administered 2020-10-13: 20 mL
  Administered 2020-10-13 – 2020-10-14 (×2): 10 mL

## 2020-10-13 MED ORDER — DULOXETINE HCL 30 MG PO CPEP
30.0000 mg | ORAL_CAPSULE | Freq: Every day | ORAL | Status: DC
  Administered 2020-10-13 – 2020-10-19 (×7): 30 mg via ORAL
  Filled 2020-10-13 (×7): qty 1

## 2020-10-13 MED ORDER — ORAL CARE MOUTH RINSE
15.0000 mL | Freq: Two times a day (BID) | OROMUCOSAL | Status: DC
  Administered 2020-10-13 – 2020-10-19 (×14): 15 mL via OROMUCOSAL

## 2020-10-13 MED ORDER — CHLORHEXIDINE GLUCONATE CLOTH 2 % EX PADS: 6.0000 | MEDICATED_PAD | Freq: Every day | CUTANEOUS | Status: DC

## 2020-10-13 MED ORDER — INSULIN DETEMIR 100 UNIT/ML ~~LOC~~ SOLN
30.0000 [IU] | Freq: Every day | SUBCUTANEOUS | Status: DC
  Administered 2020-10-14 – 2020-10-16 (×3): 30 [IU] via SUBCUTANEOUS
  Filled 2020-10-13 (×4): qty 0.3

## 2020-10-13 MED ORDER — ENOXAPARIN SODIUM 40 MG/0.4ML ~~LOC~~ SOLN
40.0000 mg | Freq: Every day | SUBCUTANEOUS | Status: DC
  Administered 2020-10-13 – 2020-10-18 (×6): 40 mg via SUBCUTANEOUS
  Filled 2020-10-13 (×6): qty 0.4

## 2020-10-13 MED ORDER — INSULIN ASPART 100 UNIT/ML ~~LOC~~ SOLN
0.0000 [IU] | SUBCUTANEOUS | Status: DC
  Administered 2020-10-13: 2 [IU] via SUBCUTANEOUS
  Administered 2020-10-13: 8 [IU] via SUBCUTANEOUS
  Administered 2020-10-13 – 2020-10-14 (×2): 4 [IU] via SUBCUTANEOUS
  Administered 2020-10-14 (×2): 2 [IU] via SUBCUTANEOUS

## 2020-10-13 MED ORDER — INSULIN DETEMIR 100 UNIT/ML ~~LOC~~ SOLN
30.0000 [IU] | Freq: Every day | SUBCUTANEOUS | Status: AC
  Administered 2020-10-13: 30 [IU] via SUBCUTANEOUS
  Filled 2020-10-13: qty 0.3

## 2020-10-13 MED ORDER — SODIUM CHLORIDE 0.9% FLUSH: 10.0000 mL | INTRAVENOUS | Status: DC | PRN

## 2020-10-13 MED ORDER — CHLORHEXIDINE GLUCONATE CLOTH 2 % EX PADS
6.0000 | MEDICATED_PAD | Freq: Every day | CUTANEOUS | Status: DC
  Administered 2020-10-13 – 2020-10-14 (×2): 6 via TOPICAL

## 2020-10-13 MED FILL — Potassium Chloride Inj 2 mEq/ML: INTRAVENOUS | Qty: 40 | Status: AC

## 2020-10-13 MED FILL — Thrombin (Recombinant) For Soln 20000 Unit: CUTANEOUS | Qty: 1 | Status: AC

## 2020-10-13 MED FILL — Heparin Sodium (Porcine) Inj 1000 Unit/ML: INTRAMUSCULAR | Qty: 30 | Status: AC

## 2020-10-13 MED FILL — Magnesium Sulfate Inj 50%: INTRAMUSCULAR | Qty: 10 | Status: AC

## 2020-10-13 NOTE — Progress Notes (Signed)
Patient ID: Thomas Mckee, male   DOB: Nov 04, 1940, 80 y.o.   MRN: [id number] TCTS Evening Rounds:  He is hemodynamically stable.  Pacer still on DDD 80. Under pacer he is in the 50's with what looks like high grade heart block. With pacer on DDD 80 he is sensing atrium and pacing ventricle in the 95 range. He had LBBB preop.  Urine output ok Awake and alert.  CBC    Component Value Date/Time   WBC 12.1 (H) 10/13/2020 1700   RBC 3.30 (L) 10/13/2020 1700   HGB 9.8 (L) 10/13/2020 1700   HGB 15.1 09/03/2020 1014   HCT 31.2 (L) 10/13/2020 1700   HCT 44.8 09/03/2020 1014   PLT 126 (L) 10/13/2020 1700   PLT 250 09/03/2020 1014   MCV 94.5 10/13/2020 1700   MCV 88 09/03/2020 1014   MCH 29.7 10/13/2020 1700   MCHC 31.4 10/13/2020 1700   RDW 14.7 10/13/2020 1700   RDW 14.1 09/03/2020 1014   LYMPHSABS 1.7 08/12/2020 1703   EOSABS 0.2 08/12/2020 1703   BASOSABS 0.1 08/12/2020 1703   BMET    Component Value Date/Time   NA 132 (L) 10/13/2020 1700   NA 139 09/03/2020 1014   K 4.0 10/13/2020 1700   CL 101 10/13/2020 1700   CO2 24 10/13/2020 1700   GLUCOSE 253 (H) 10/13/2020 1700   BUN 21 10/13/2020 1700   BUN 22 09/03/2020 1014   CREATININE 1.26 (H) 10/13/2020 1700   CALCIUM 8.0 (L) 10/13/2020 1700   GFRNONAA 58 (L) 10/13/2020 1700   GFRAA 53 (L) 08/12/2020 1703

## 2020-10-13 NOTE — Hospital Course (Signed)
History of Present Illness:  The patient is a 80 year old gentleman with history of hypertension, hyperlipidemia, diabetes, stage III chronic kidney disease, chronic pain secondary to 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  He tells me that he woke up in the morning and tried to turn on the TV but his hand was weak and he could not figure out how to turn the TV on.  He was also having some visual changes.  He went to the emergency department at [facility] and was diagnosed with a stroke.  Apparently CT showed a previous cerebellar stroke.  He was admitted there for further care.  A 2D echocardiogram reportedly showed a depressed ejection fraction with some wall motion abnormalities.  Electrocardiogram showed left bundle branch block.  He was apparently discharged and sent to see Dr. Harriet Masson for cardiology evaluation.  He waited some intermittent chest although he tells me that he is never had any chest pain or pressure and denies any history of shortness of breath.  He underwent cardiac catheterization by Dr. Irish Lack on 08/18/2020.  This showed severe multivessel coronary disease.  The LAD had 50% ostial to proximal stenosis followed by 75% proximal and mid LAD stenosis.  This area was diffusely calcified.  There is a small diagonal branch had 80% stenosis.  The left circumflex had mild nonobstructive disease.  The right coronary artery was a large dominant vessel that had 75% stenosis after the posterior descending branch 90% stenosis prior to a moderate size posterior lateral branch.  LVEDP was 17.  Given the long area of stenosis in the proximal LAD that was calcified and his history of diabetes it was felt that coronary bypass surgery may be the best treatment for him and he was referred to me for evaluation.  The patient was also found to be in rate controlled atrial fibrillation when he was seen by Dr. Harriet Masson with no prior history.  He was started on  Eliquis 5 mg twice a day due to his CHA2DS2-VASc score of 6.  He tells me today that he has not started taking the Eliquis since he was placed on aspirin and Plavix when he left the hospital and the pharmacy told him that he should not be taking Plavix with the Eliquis.  Course in Hospital: Patient was admitted for elective surgery on 10/12/2020. He was taken to the operating where left carotid endarterectomy with patch closure was accomplished.  This was followed by three-vessel coronary bypass grafting and clipping of the left atrial appendage.  Following procedures, he separated from cardiopulmonary bypass without any difficulty.  He was transferred to the ICU in stable condition.  He remained hemodynamically stable.  He was weaned from the ventilator and extubated routinely by around 8 PM on the day of surgery.  On postop day 1, he was noted to have sinus bradycardia.  Temporary pacing was continued utilizing epicardial pacing wires.  Beta-blocker was withheld for this reason.  He was mobilized.  Monitoring lines and chest tubes were removed routinely on the first postoperative day.  Diuresis was begun with IV furosemide. The right neck drain was removed as well. He had no neurologic deficits.

## 2020-10-13 NOTE — Progress Notes (Addendum)
[facility] [place]  Subjective  - Doing well over all.  A & O x3.   Objective 118/64 80 (!) 97.3 F (36.3 C) 13 98%  Intake/Output Summary (Last 24 hours) at 10/13/2020 0721 Last data filed at 10/13/2020 0600 Gross per 24 hour  Intake 5227.75 ml  Output 4028 ml  Net 1199.75 ml    Right CEA dressing intact.  JP drain 40 cc Op No tongue deviation and no facial droop. Moving all 4 ext.   Assessment/Planning: POD # Right CEA for symptomatic carotid stenosis  Primary progression per CT surgery Stable disposition this am without neurologic deficits.   Roxy Horseman 10/13/2020 7:21 AM --  Laboratory Lab Results: Recent Labs    10/12/20 2023 10/12/20 2120 10/13/20 0401  WBC 13.2*  --  10.6*  HGB 11.1* 10.5* 9.5*  HCT 33.9* 31.0* 29.0*  PLT 130*  --  PLATELET CLUMPS NOTED ON SMEAR, UNABLE TO ESTIMATE   BMET Recent Labs    10/12/20 2023 10/12/20 2120 10/13/20 0401  NA 136 142 137  K 4.2 4.3 4.2  CL 105  --  108  CO2 21*  --  21*  GLUCOSE 100*  --  118*  BUN 19  --  19  CREATININE 0.90  --  1.06  CALCIUM 8.2*  --  8.1*    COAG Lab Results  Component Value Date   INR 1.5 (H) 10/12/2020   INR 1.0 10/08/2020   No results found for: PTT  I have seen and evaluated the patient. I agree with the PA note as documented above.  Postop day 1 status post right carotid endarterectomy in combination with coronary bypass.  Patient has been extubated.  No neurologic deficits and is neurologically intact.  Neck looks clean dry and intact after dressing removed.  No hematoma.   Serosanguineous output from JP drain 40 cc recorded.  Agree that drain can be removed.  Looks good from a vascular standpoint.  [name], MD [facility] [place] Office: [phone]

## 2020-10-13 NOTE — TOC Initial Note (Signed)
Transition of Care [facility]) - Initial/Assessment Note    Patient Details  Name: Thomas Mckee MRN: [id number] Date of Birth: 05/15/1941  [facility]) CM/SW Contact:    Carles Collet, RN Phone Number: 10/13/2020, 1:56 PM  Clinical Narrative:          Damaris Schooner w patient at bedside.  He states that he plans to return home at DC (PT evals pending). He has a caregiver, Autumn Maness who he states helps to clean, and can run errands like going to the pharmacy for him.  Prior to admission he described himself as [facility] TOC will continue to follow for post DC needs.          Expected Discharge Plan: [place] Barriers to Discharge: Continued Medical Work up   Patient Goals and CMS Choice Patient states their goals for this hospitalization and ongoing recovery are:: to go home      Expected Discharge Plan and Services Expected Discharge Plan: [place]                                              Prior Living Arrangements/Services                       Activities of Daily Living      Permission Sought/Granted                  Emotional Assessment              Admission diagnosis:  S/P CABG x 3 [Z95.1] Patient Active Problem List   Diagnosis Date Noted  . S/P CABG x 3 10/12/2020  . Left ventricular dysfunction   . Atrial fibrillation ([place]) 08/13/2020  . LBBB (left bundle branch block) 08/13/2020  . HFrEF (heart failure with reduced ejection fraction) ([place]) 08/13/2020  . Abnormal echocardiogram 08/13/2020  . Abnormal ventricular wall motion 08/13/2020  . History of CVA (cerebrovascular accident) 08/13/2020  . Type 2 diabetes mellitus with complication, with long-term current use of insulin ([place]) 08/13/2020  . Primary hypertension 08/13/2020  . Mixed hyperlipidemia 08/13/2020  . Chronic kidney disease, stage 3 ([place])   . Cerebral arteriosclerosis with history of previous  cerebrovascular accident   . Carotid stenosis   . Systolic CHF ([place])   . Pain management contract agreement 06/27/2019  . DDD (degenerative disc disease), lumbar 02/28/2019  . Lumbar radiculopathy 02/28/2019  . Neck pain 02/28/2019  . Other chronic pain 02/28/2019  . S/P insertion of spinal cord stimulator 02/28/2019  . Status post lumbar laminectomy 02/28/2019   PCP:  Maryella Shivers, MD Pharmacy:   [id number] - [place], [place] [address] Phone: [phone] Fax: [phone]     Social Determinants of Health (SDOH) Interventions    Readmission Risk Interventions No flowsheet data found.

## 2020-10-13 NOTE — Anesthesia Postprocedure Evaluation (Signed)
Anesthesia Post Note  Patient: Thomas Mckee  Procedure(s) Performed: CORONARY ARTERY BYPASS GRAFTING (CABG) X TWO USING LEFT INTERNAL MAMMARY ARTERY AND RIGHT ENDOSCOPIC SAPHENOUS VEIN HARVEST CONDUIT (N/A Chest) CLIPPING OF ATRIAL APPENDAGE (N/A ) TRANSESOPHAGEAL ECHOCARDIOGRAM (TEE) (N/A ) ENDARTERECTOMY CAROTID USING XENOSURE BIOLOGIC PATCH 1CM X 6CM (Right )     Patient location during evaluation: SICU Anesthesia Type: General Level of consciousness: awake Pain management: pain level controlled Vital Signs Assessment: post-procedure vital signs reviewed and stable Respiratory status: spontaneous breathing, nonlabored ventilation, respiratory function stable and patient connected to nasal cannula oxygen Cardiovascular status: blood pressure returned to baseline and stable Postop Assessment: no apparent nausea or vomiting Anesthetic complications: no   No complications documented.  Last Vitals:  Vitals:   10/13/20 0900 10/13/20 1000  BP: (!) 100/54 (!) 93/54  Pulse: 78 79  Resp: 11 15  Temp:    SpO2: 98% 99%    Last Pain:  Vitals:   10/13/20 0800  TempSrc: Core  PainSc: 0-No pain                 Thomas Mckee

## 2020-10-13 NOTE — Discharge Instructions (Signed)

## 2020-10-13 NOTE — Progress Notes (Signed)
1 Day Post-Op Procedure(s) (LRB): CORONARY ARTERY BYPASS GRAFTING (CABG) X TWO USING LEFT INTERNAL MAMMARY ARTERY AND RIGHT ENDOSCOPIC SAPHENOUS VEIN HARVEST CONDUIT (N/A) CLIPPING OF ATRIAL APPENDAGE (N/A) TRANSESOPHAGEAL ECHOCARDIOGRAM (TEE) (N/A) ENDARTERECTOMY CAROTID USING XENOSURE BIOLOGIC PATCH 1CM X 6CM (Right) Subjective: No complaints.  Objective: Vital signs in last 24 hours: Temp:  [97.16 F (36.2 C)-98.4 F (36.9 C)] 97.3 F (36.3 C) (04/26 0630) Pulse Rate:  [79-81] 80 (04/26 0630) Cardiac Rhythm: A-V Sequential paced (04/26 0400) Resp:  [1-27] 13 (04/26 0630) BP: (74-127)/(41-71) 118/64 (04/26 0600) SpO2:  [97 %-100 %] 98 % (04/26 0630) Arterial Line BP: (100-151)/(41-73) 127/43 (04/26 0630) FiO2 (%):  [40 %-50 %] 40 % (04/25 2000) Weight:  [109.9 kg] 109.9 kg (04/26 0557)  Hemodynamic parameters for last 24 hours: PAP: (18-42)/(10-22) 28/12 CO:  [3.5 L/min-5.3 L/min] 5.3 L/min CI:  [1.5 L/min/m2-2.3 L/min/m2] 2.3 L/min/m2  Intake/Output from previous day: 04/25 0701 - 04/26 0700 In: 5227.8 [I.V.:3155.6; Blood:685; IV Piggyback:1387.1] Out: 2505 [Urine:2430; Drains:40; Blood:1158; Chest Tube:400] Intake/Output this shift: No intake/output data recorded.  General appearance: alert and cooperative Neurologic: intact Heart: regular rate and rhythm, S1, S2 normal, no murmur, click, rub or gallop Lungs: clear to auscultation bilaterally Extremities: edema mild Wound: dressings dry  Lab Results: Recent Labs    10/12/20 2023 10/12/20 2120 10/13/20 0401  WBC 13.2*  --  10.6*  HGB 11.1* 10.5* 9.5*  HCT 33.9* 31.0* 29.0*  PLT 130*  --  PLATELET CLUMPS NOTED ON SMEAR, UNABLE TO ESTIMATE   BMET:  Recent Labs    10/12/20 2023 10/12/20 2120 10/13/20 0401  NA 136 142 137  K 4.2 4.3 4.2  CL 105  --  108  CO2 21*  --  21*  GLUCOSE 100*  --  118*  BUN 19  --  19  CREATININE 0.90  --  1.06  CALCIUM 8.2*  --  8.1*    PT/INR:  Recent Labs     10/12/20 1431  LABPROT 17.9*  INR 1.5*   ABG    Component Value Date/Time   PHART 7.312 (L) 10/12/2020 2120   HCO3 23.9 10/12/2020 2120   TCO2 25 10/12/2020 2120   ACIDBASEDEF 3.0 (H) 10/12/2020 2120   O2SAT 99.0 10/12/2020 2120   CBG (last 3)  Recent Labs    10/13/20 0355 10/13/20 0519 10/13/20 0602  GLUCAP 121* 111* 127*   CXR: clear   ECG: done with pacer on.  Assessment/Plan: S/P Procedure(s) (LRB): CORONARY ARTERY BYPASS GRAFTING (CABG) X TWO USING LEFT INTERNAL MAMMARY ARTERY AND RIGHT ENDOSCOPIC SAPHENOUS VEIN HARVEST CONDUIT (N/A) CLIPPING OF ATRIAL APPENDAGE (N/A) TRANSESOPHAGEAL ECHOCARDIOGRAM (TEE) (N/A) ENDARTERECTOMY CAROTID USING XENOSURE BIOLOGIC PATCH 1CM X 6CM (Right)  POD 1 Hemodynamically stable. Bradycardic under pacer so will hold off on beta blocker and continue pacing for now. Check ECG without pacer on.  DC chest tubes, swan, arterial line and JP.  Diurese  OOB, IS, mobilize.  DM: transition to Levemir and SSI.   LOS: 1 day    Gaye Pollack 10/13/2020

## 2020-10-13 NOTE — Progress Notes (Signed)
PT Cancellation Note  Patient Details Name: Thomas Mckee MRN: [id number] DOB: April 08, 1941   Cancelled Treatment:    Reason Eval/Treat Not Completed: Patient declined, no reason specified.  Pt reported up a long time and having taken a short walk.  Declined therapy today. 10/13/2020  Thomas Carne., PT Acute Rehabilitation Services [phone]  (pager) [phone]  (office)   Thomas Mckee 10/13/2020, 5:40 PM

## 2020-10-14 ENCOUNTER — Telehealth: Payer: Self-pay

## 2020-10-14 ENCOUNTER — Inpatient Hospital Stay (HOSPITAL_COMMUNITY): Payer: HMO

## 2020-10-14 LAB — BASIC METABOLIC PANEL
Anion gap: 8 (ref 5–15)
BUN: 21 mg/dL (ref 8–23)
CO2: 24 mmol/L (ref 22–32)
Calcium: 8.1 mg/dL — ABNORMAL LOW (ref 8.9–10.3)
Chloride: 100 mmol/L (ref 98–111)
Creatinine, Ser: 1.14 mg/dL (ref 0.61–1.24)
GFR, Estimated: >60 mL/min (ref >60)
Glucose, Bld: 142 mg/dL — ABNORMAL HIGH (ref 70–99)
Potassium: 3.7 mmol/L (ref 3.5–5.1)
Sodium: 132 mmol/L — ABNORMAL LOW (ref 135–145)

## 2020-10-14 LAB — CBC
HCT: 28.2 % — ABNORMAL LOW (ref 39.0–52.0)
Hemoglobin: 9.1 g/dL — ABNORMAL LOW (ref 13.0–17.0)
MCH: 30.2 pg (ref 26.0–34.0)
MCHC: 32.3 g/dL (ref 30.0–36.0)
MCV: 93.7 fL (ref 80.0–100.0)
Platelets: 106 10*3/uL — ABNORMAL LOW (ref 150–400)
RBC: 3.01 MIL/uL — ABNORMAL LOW (ref 4.22–5.81)
RDW: 14.6 % (ref 11.5–15.5)
WBC: 10 10*3/uL (ref 4.0–10.5)
nRBC: 0 % (ref 0.0–0.2)

## 2020-10-14 LAB — GLUCOSE, CAPILLARY
Glucose-Capillary: 131 mg/dL — ABNORMAL HIGH (ref 70–99)
Glucose-Capillary: 123 mg/dL — ABNORMAL HIGH (ref 70–99)
Glucose-Capillary: 163 mg/dL — ABNORMAL HIGH (ref 70–99)
Glucose-Capillary: 180 mg/dL — ABNORMAL HIGH (ref 70–99)
Glucose-Capillary: 147 mg/dL — ABNORMAL HIGH (ref 70–99)

## 2020-10-14 MED ORDER — CHLORHEXIDINE GLUCONATE CLOTH 2 % EX PADS
6.0000 | MEDICATED_PAD | Freq: Every day | CUTANEOUS | Status: DC
  Administered 2020-10-15: 6 via TOPICAL

## 2020-10-14 MED ORDER — PANTOPRAZOLE SODIUM 40 MG PO TBEC
40.0000 mg | DELAYED_RELEASE_TABLET | Freq: Every day | ORAL | Status: DC
  Administered 2020-10-15 – 2020-10-19 (×5): 40 mg via ORAL
  Filled 2020-10-14 (×5): qty 1

## 2020-10-14 MED ORDER — ONDANSETRON HCL 4 MG/2ML IJ SOLN: 4.0000 mg | Freq: Four times a day (QID) | INTRAMUSCULAR | Status: DC | PRN

## 2020-10-14 MED ORDER — INSULIN ASPART 100 UNIT/ML ~~LOC~~ SOLN
0.0000 [IU] | Freq: Three times a day (TID) | SUBCUTANEOUS | Status: DC
  Administered 2020-10-14: 4 [IU] via SUBCUTANEOUS
  Administered 2020-10-14 – 2020-10-16 (×6): 2 [IU] via SUBCUTANEOUS
  Administered 2020-10-16: 12 [IU] via SUBCUTANEOUS
  Administered 2020-10-16: 2 [IU] via SUBCUTANEOUS
  Administered 2020-10-17: 4 [IU] via SUBCUTANEOUS
  Administered 2020-10-17: 8 [IU] via SUBCUTANEOUS
  Administered 2020-10-17: 4 [IU] via SUBCUTANEOUS
  Administered 2020-10-18: 2 [IU] via SUBCUTANEOUS
  Administered 2020-10-18 (×2): 4 [IU] via SUBCUTANEOUS
  Administered 2020-10-18 – 2020-10-19 (×2): 8 [IU] via SUBCUTANEOUS
  Administered 2020-10-19: 4 [IU] via SUBCUTANEOUS

## 2020-10-14 MED ORDER — ATORVASTATIN CALCIUM 40 MG PO TABS
40.0000 mg | ORAL_TABLET | Freq: Every day | ORAL | Status: DC
  Administered 2020-10-14 – 2020-10-19 (×6): 40 mg via ORAL
  Filled 2020-10-14 (×6): qty 1

## 2020-10-14 MED ORDER — SENNOSIDES-DOCUSATE SODIUM 8.6-50 MG PO TABS
1.0000 | ORAL_TABLET | Freq: Two times a day (BID) | ORAL | Status: DC | PRN
Start: 2020-10-14 — End: 2020-10-19

## 2020-10-14 MED ORDER — OXYCODONE HCL 5 MG PO TABS: 5.0000 mg | ORAL_TABLET | ORAL | Status: DC | PRN

## 2020-10-14 MED ORDER — ASPIRIN EC 325 MG PO TBEC
325.0000 mg | DELAYED_RELEASE_TABLET | Freq: Every day | ORAL | Status: DC
  Administered 2020-10-15 – 2020-10-19 (×5): 325 mg via ORAL
  Filled 2020-10-14 (×5): qty 1

## 2020-10-14 MED ORDER — ~~LOC~~ CARDIAC SURGERY, PATIENT & FAMILY EDUCATION: Freq: Once | Status: AC

## 2020-10-14 MED ORDER — TRAMADOL HCL 50 MG PO TABS
50.0000 mg | ORAL_TABLET | Freq: Four times a day (QID) | ORAL | Status: DC | PRN
  Administered 2020-10-15: 50 mg via ORAL
  Filled 2020-10-14: qty 1

## 2020-10-14 MED ORDER — SODIUM CHLORIDE 0.9% FLUSH
3.0000 mL | Freq: Two times a day (BID) | INTRAVENOUS | Status: DC
  Administered 2020-10-14 – 2020-10-19 (×10): 3 mL via INTRAVENOUS

## 2020-10-14 MED ORDER — SODIUM CHLORIDE 0.9% FLUSH: 3.0000 mL | INTRAVENOUS | Status: DC | PRN

## 2020-10-14 MED ORDER — FUROSEMIDE 40 MG PO TABS
40.0000 mg | ORAL_TABLET | Freq: Every day | ORAL | Status: DC
  Administered 2020-10-14 – 2020-10-16 (×3): 40 mg via ORAL
  Filled 2020-10-14 (×3): qty 1

## 2020-10-14 MED ORDER — ONDANSETRON HCL 4 MG PO TABS
4.0000 mg | ORAL_TABLET | Freq: Four times a day (QID) | ORAL | Status: DC | PRN
Start: 2020-10-14 — End: 2020-10-19

## 2020-10-14 MED ORDER — POTASSIUM CHLORIDE CRYS ER 20 MEQ PO TBCR
20.0000 meq | EXTENDED_RELEASE_TABLET | Freq: Two times a day (BID) | ORAL | Status: DC
  Administered 2020-10-14 – 2020-10-16 (×6): 20 meq via ORAL
  Filled 2020-10-14 (×6): qty 1

## 2020-10-14 MED ORDER — ACETAMINOPHEN 325 MG PO TABS
650.0000 mg | ORAL_TABLET | Freq: Four times a day (QID) | ORAL | Status: DC | PRN
  Administered 2020-10-14: 650 mg via ORAL
  Filled 2020-10-14: qty 2

## 2020-10-14 MED ORDER — SODIUM CHLORIDE 0.9 % IV SOLN
250.0000 mL | INTRAVENOUS | Status: DC | PRN
Start: 2020-10-14 — End: 2020-10-19

## 2020-10-14 MED ORDER — POTASSIUM CHLORIDE CRYS ER 20 MEQ PO TBCR
20.0000 meq | EXTENDED_RELEASE_TABLET | ORAL | Status: DC
  Administered 2020-10-14 (×2): 20 meq via ORAL
  Filled 2020-10-14 (×2): qty 1

## 2020-10-14 NOTE — Progress Notes (Signed)
CARDIAC REHAB PHASE I   Went to offer to walk with pt. Pt currently working with PT. Will continue to follow and encourage ambulation as able.  Rufina Falco, RN BSN 10/14/2020 2:07 PM

## 2020-10-14 NOTE — Progress Notes (Addendum)
[facility] [place] and making progress everyday.   Objective 123/66 96 97.6 F (36.4 C) (Oral) 10 98%  Intake/Output Summary (Last 24 hours) at 10/14/2020 0659 Last data filed at 10/14/2020 0600 Gross per 24 hour  Intake 528.1 ml  Output 915 ml  Net -386.9 ml    Right neck incision healing well with edema/ healing ridge.  No Zakar hematoma. No tongue deviation, no facial droop  Speech is clear and he is tolerating PO without dysphasia Lungs non labored breathing  Assessment/Planning: POD # 2 right CEA Drain site dressing C/D/I  Stable post op disposition from a vascular point of view. He will f/u in our office in 2-3 weeks.  Thomas Mckee 10/14/2020 6:59 AM --  Laboratory Lab Results: Recent Labs    10/13/20 1700 10/14/20 0423  WBC 12.1* 10.0  HGB 9.8* 9.1*  HCT 31.2* 28.2*  PLT 126* 106*   BMET Recent Labs    10/13/20 1700 10/14/20 0423  NA 132* 132*  K 4.0 3.7  CL 101 100  CO2 24 24  GLUCOSE 253* 142*  BUN 21 21  CREATININE 1.26* 1.14  CALCIUM 8.0* 8.1*    COAG Lab Results  Component Value Date   INR 1.5 (H) 10/12/2020   INR 1.0 10/08/2020   No results found for: PTT  I have seen and evaluated the patient. I agree with the PA note as documented above.  Postop day 2 status post right carotid endarterectomy at the same time of coronary bypass for symptomatic high grade right ICA stenosis.  He continues to look good from a vascular standpoint.  Neurologically intact.  Neck incision is clean dry and intact with no hematoma.  Drain was removed yesterday.  Plan is to transfer to the floor today per CT surgery.  [name], MD [facility] [place] Office: [phone]

## 2020-10-14 NOTE — Evaluation (Signed)
Physical Therapy Evaluation Patient Details Name: Thomas Mckee MRN: [id number] DOB: [birth year] Today's Date: 10/14/2020   History of Present Illness  pt is a 80 y/o male presenting 4/25 with right symptomatic carotid stenosis >80% with recent TIA event, scheduled for a CABG.  Same day, pt s/p R carotid endarterectomy , CABG x3 and atrial appendage clipping.  PMHx: CVA, DDD, Lumbar radiculopathy with lami and cord stimulator, R TKA, CKD3  Clinical Impression  Pt admitted with/for carotid endarterectomy and CABG x3.  Pt needing min assist for all basic mobility and gait at times..  Pt currently limited functionally due to the problems listed. ( See problems list.)   Pt will benefit from PT to maximize function and safety in order to get ready for next venue listed below as pt has no family to assist at home.      Follow Up Recommendations SNF;Supervision/Assistance - 24 hour;Other (comment) (but working toward able to go home alone with HHPT/no follow up.)    Equipment Recommendations  Other (comment) (TBA)    Recommendations for Other Services       Precautions / Restrictions Precautions Precautions: Fall;Sternal      Mobility  Bed Mobility Overal bed mobility: Needs Assistance Bed Mobility: Rolling;Sidelying to Sit Rolling: Min assist Sidelying to sit: Min assist       General bed mobility comments: cued for technique of rolling and up via elbow held at his side.  assisted up due to pt slow to build momentum.    Transfers Overall transfer level: Needs assistance Equipment used:  (EVA walker) Transfers: Sit to/from Stand Sit to Stand: Min assist         General transfer comment: cued for sternal precautions and hand placement over his "lap"  Ambulation/Gait Ambulation/Gait assistance: Min guard Gait Distance (Feet): 200 Feet Assistive device:  (EVA wakjer) Gait Pattern/deviations: Step-through pattern   Gait velocity interpretation: 1.31 - 2.62 ft/sec,  indicative of limited community ambulator General Gait Details: generally steady, but occasionally a significant amount of foward lean onto his UE's on the EVA.  Sp02 on RA maintained in the low 90's with HR in the 90's bpm on external pacing.  Stairs            Wheelchair Mobility    Modified Rankin (Stroke Patients Only)       Balance Overall balance assessment: Needs assistance Sitting-balance support: No upper extremity supported;Feet supported Sitting balance-Leahy Scale: Fair     Standing balance support: Single extremity supported;Bilateral upper extremity supported;During functional activity Standing balance-Leahy Scale: Fair Standing balance comment: Preferred to use the EVA able to stand without assist for short periods.                             Pertinent Vitals/Pain Pain Assessment: Faces Faces Pain Scale: Hurts little more Pain Location: chest Pain Descriptors / Indicators: Discomfort;Guarding Pain Intervention(s): Monitored during session    Home Living Family/patient expects to be discharged to:: Private residence Living Arrangements: Alone Available Help at Discharge: Friend(s);Available PRN/intermittently Type of Home: House Home Access: Ramped entrance     Home Layout: One level Home Equipment: Walker - standard;Shower seat      Prior Function Level of Independence: Independent         Comments: drove, got his own groceries     Hand Dominance        Extremity/Trunk Assessment   Upper Extremity Assessment Upper Extremity Assessment: Overall WFL for  tasks assessed (not fully assessed, pt used hands functionally for eating on arrival)    Lower Extremity Assessment Lower Extremity Assessment: Overall WFL for tasks assessed;Generalized weakness       Communication   Communication: No difficulties  Cognition Arousal/Alertness: Awake/alert Behavior During Therapy: WFL for tasks assessed/performed Overall Cognitive  Status: No family/caregiver present to determine baseline cognitive functioning                                 General Comments: pt with slowed process, halted recall of people, events and sequences of events in time.      General Comments General comments (skin integrity, edema, etc.): Instructed in /reinforced sternal precautions.    Exercises     Assessment/Plan    PT Assessment Patient needs continued PT services  PT Problem List Decreased strength;Decreased activity tolerance;Decreased mobility;Decreased knowledge of use of DME;Cardiopulmonary status limiting activity;Decreased knowledge of precautions;Pain       PT Treatment Interventions DME instruction;Gait training;Functional mobility training;Therapeutic activities;Balance training;Patient/family education    PT Goals (Current goals can be found in the Care Plan section)  Acute Rehab PT Goals Patient Stated Goal: home independent PT Goal Formulation: With patient Time For Goal Achievement: 10/28/20 Potential to Achieve Goals: Good    Frequency Min 3X/week   Barriers to discharge        Co-evaluation               AM-PAC PT "6 Clicks" Mobility  Outcome Measure Help needed turning from your back to your side while in a flat bed without using bedrails?: A Little Help needed moving from lying on your back to sitting on the side of a flat bed without using bedrails?: A Little Help needed moving to and from a bed to a chair (including a wheelchair)?: A Little Help needed standing up from a chair using your arms (e.g., wheelchair or bedside chair)?: A Little Help needed to walk in hospital room?: A Little Help needed climbing 3-5 steps with a railing? : A Lot 6 Click Score: 17    End of Session   Activity Tolerance: Patient tolerated treatment well Patient left: in bed;with call bell/phone within reach;with bed alarm set Nurse Communication: Mobility status PT Visit Diagnosis: Other  abnormalities of gait and mobility (R26.89);Muscle weakness (generalized) (M62.81);Difficulty in walking, not elsewhere classified (R26.2);Pain Pain - part of body:  (sternal)    Time: 6378-5885 PT Time Calculation (min) (ACUTE ONLY): 26 min   Charges:   PT Evaluation $PT Eval Moderate Complexity: 1 Mod PT Treatments $Gait Training: 8-22 mins        10/14/2020  Ginger Carne., PT Acute Rehabilitation Services [phone]  (pager) [phone]  (office)  Tessie Fass Renetta Suman 10/14/2020, 4:57 PM

## 2020-10-14 NOTE — Progress Notes (Signed)
TCTS BRIEF SICU PROGRESS NOTE  2 Days Post-Op  S/P Procedure(s) (LRB): CORONARY ARTERY BYPASS GRAFTING (CABG) X TWO USING LEFT INTERNAL MAMMARY ARTERY AND RIGHT ENDOSCOPIC SAPHENOUS VEIN HARVEST CONDUIT (N/A) CLIPPING OF ATRIAL APPENDAGE (N/A) TRANSESOPHAGEAL ECHOCARDIOGRAM (TEE) (N/A) ENDARTERECTOMY CAROTID USING XENOSURE BIOLOGIC PATCH 1CM X 6CM (Right)   Stable day Paced rhythm w/ stable BP Breathing comfortably on room ai  Plan: Continue current plan  Rexene Alberts, MD 10/14/2020 5:02 PM

## 2020-10-14 NOTE — Progress Notes (Signed)
2 Days Post-Op Procedure(s) (LRB): CORONARY ARTERY BYPASS GRAFTING (CABG) X TWO USING LEFT INTERNAL MAMMARY ARTERY AND RIGHT ENDOSCOPIC SAPHENOUS VEIN HARVEST CONDUIT (N/A) CLIPPING OF ATRIAL APPENDAGE (N/A) TRANSESOPHAGEAL ECHOCARDIOGRAM (TEE) (N/A) ENDARTERECTOMY CAROTID USING XENOSURE BIOLOGIC PATCH 1CM X 6CM (Right) Subjective: No complaints.  Objective: Vital signs in last 24 hours: Temp:  [97.6 F (36.4 C)-98.4 F (36.9 C)] 97.6 F (36.4 C) (04/27 0650) Pulse Rate:  [50-108] 108 (04/27 0750) Cardiac Rhythm: A-V Sequential paced (04/27 0750) Resp:  [10-21] 15 (04/27 0750) BP: (93-146)/(41-73) 123/53 (04/27 0700) SpO2:  [79 %-100 %] 97 % (04/27 0750) Arterial Line BP: (121-147)/(42-56) 121/43 (04/26 1300) Weight:  [110.4 kg] 110.4 kg (04/27 0500)  Hemodynamic parameters for last 24 hours:    Intake/Output from previous day: 04/26 0701 - 04/27 0700 In: 528.1 [I.V.:292.9; IV Piggyback:235.2] Out: 915 [Urine:905; Drains:10] Intake/Output this shift: Total I/O In: 200 [P.O.:200] Out: 100 [Urine:100]  General appearance: alert and cooperative Neurologic: intact Heart: regular rate and rhythm Lungs: clear to auscultation bilaterally Extremities: edema mild Wound: dressings dry  Lab Results: Recent Labs    10/13/20 1700 10/14/20 0423  WBC 12.1* 10.0  HGB 9.8* 9.1*  HCT 31.2* 28.2*  PLT 126* 106*   BMET:  Recent Labs    10/13/20 1700 10/14/20 0423  NA 132* 132*  K 4.0 3.7  CL 101 100  CO2 24 24  GLUCOSE 253* 142*  BUN 21 21  CREATININE 1.26* 1.14  CALCIUM 8.0* 8.1*    PT/INR:  Recent Labs    10/12/20 1431  LABPROT 17.9*  INR 1.5*   ABG    Component Value Date/Time   PHART 7.312 (L) 10/12/2020 2120   HCO3 23.9 10/12/2020 2120   TCO2 25 10/12/2020 2120   ACIDBASEDEF 3.0 (H) 10/12/2020 2120   O2SAT 99.0 10/12/2020 2120   CBG (last 3)  Recent Labs    10/13/20 2356 10/14/20 0431 10/14/20 0646  GLUCAP 149* 131* 123*   CXR: mild bibasilar  atelectasis  Assessment/Plan: S/P Procedure(s) (LRB): CORONARY ARTERY BYPASS GRAFTING (CABG) X TWO USING LEFT INTERNAL MAMMARY ARTERY AND RIGHT ENDOSCOPIC SAPHENOUS VEIN HARVEST CONDUIT (N/A) CLIPPING OF ATRIAL APPENDAGE (N/A) TRANSESOPHAGEAL ECHOCARDIOGRAM (TEE) (N/A) ENDARTERECTOMY CAROTID USING XENOSURE BIOLOGIC PATCH 1CM X 6CM (Right)  POD 2 Hemodynamically stable  Rhythm appears to be sinus 97-100 with 2nd or 3rd degree heart block. He is paced DDD at 51 and is sensing the atrium and pacing ventricle at 97-100. No beta blocker. He had LBBB preop. Will keep on DDD 60 and observe. Get EP to see him.  Continue diuresis  IS, mobilization.  DM: glucose under good control on Levemir and SSI.  DC neck sleeve.  Transfer to 4E.   LOS: 2 days    Gaye Pollack 10/14/2020

## 2020-10-15 DIAGNOSIS — I4891 Unspecified atrial fibrillation: Secondary | ICD-10-CM | POA: Diagnosis not present

## 2020-10-15 DIAGNOSIS — I441 Atrioventricular block, second degree: Secondary | ICD-10-CM | POA: Diagnosis not present

## 2020-10-15 LAB — GLUCOSE, CAPILLARY
Glucose-Capillary: 88 mg/dL (ref 70–99)
Glucose-Capillary: 135 mg/dL — ABNORMAL HIGH (ref 70–99)
Glucose-Capillary: 146 mg/dL — ABNORMAL HIGH (ref 70–99)
Glucose-Capillary: 127 mg/dL — ABNORMAL HIGH (ref 70–99)

## 2020-10-15 LAB — BASIC METABOLIC PANEL
Anion gap: 10 (ref 5–15)
BUN: 18 mg/dL (ref 8–23)
CO2: 25 mmol/L (ref 22–32)
Calcium: 8.5 mg/dL — ABNORMAL LOW (ref 8.9–10.3)
Chloride: 101 mmol/L (ref 98–111)
Creatinine, Ser: 1.03 mg/dL (ref 0.61–1.24)
GFR, Estimated: >60 mL/min (ref >60)
Glucose, Bld: 128 mg/dL — ABNORMAL HIGH (ref 70–99)
Potassium: 4.1 mmol/L (ref 3.5–5.1)
Sodium: 136 mmol/L (ref 135–145)

## 2020-10-15 MED FILL — Heparin Sodium (Porcine) Inj 1000 Unit/ML: INTRAMUSCULAR | Qty: 10 | Status: AC

## 2020-10-15 MED FILL — Mannitol IV Soln 20%: INTRAVENOUS | Qty: 500 | Status: AC

## 2020-10-15 MED FILL — Electrolyte-R (PH 7.4) Solution: INTRAVENOUS | Qty: 4000 | Status: AC

## 2020-10-15 MED FILL — Sodium Chloride IV Soln 0.9%: INTRAVENOUS | Qty: 2000 | Status: AC

## 2020-10-15 MED FILL — Lidocaine HCl Local Soln Prefilled Syringe 100 MG/5ML (2%): INTRAMUSCULAR | Qty: 5 | Status: AC

## 2020-10-15 MED FILL — Sodium Bicarbonate IV Soln 8.4%: INTRAVENOUS | Qty: 50 | Status: AC

## 2020-10-15 NOTE — TOC Initial Note (Addendum)
Transition of Care [facility]) - Initial/Assessment Note    Patient Details  Name: Thomas Mckee MRN: [id number] Date of Birth: 12-04-1940  [facility] CM/SW Contact:    Trula Ore, [place] Phone Number: 10/15/2020, 10:17 AM  Clinical Narrative:                  CSW received consult for possible SNF placement at time of discharge.Patient initially was agreeable to SNF placement when CSW spoke with patient mid morning.When CSW stopped back by to follow up with patient about dc plans patient expressed understanding of PT recommendation and declined SNF placement at time of discharge. Patient reports he has a caregiver that comes in and helps him. Patient is interested in home health services. CSW let patient know case manager will follow up with him with home health needs. CSW informed case Freight forwarder.Patient has received the COVID vaccines as well as booster. No further questions reported at this time. CSW to continue to follow and assist with discharge planning needs.  Expected Discharge Plan: Skilled Nursing Facility Barriers to Discharge: Continued Medical Work up   Patient Goals and CMS Choice Patient states their goals for this hospitalization and ongoing recovery are:: to go to SNF CMS Medicare.gov Compare Post Acute Care list provided to:: Patient Choice offered to / list presented to : Patient  Expected Discharge Plan and Services Expected Discharge Plan: [place] In-house Referral: Clinical Social Work     Living arrangements for the past 2 months: [place]                                      Prior Living Arrangements/Services Living arrangements for the past 2 months: Single Family Home Lives with:: Self Patient language and need for interpreter reviewed:: Yes Do you feel safe going back to the place where you live?: No   SNF  Need for Family Participation in Patient Care: Yes (Comment) Care giver support system in place?:  Yes (comment)   Criminal Activity/Legal Involvement Pertinent to Current Situation/Hospitalization: No - Comment as needed  Activities of Daily Living Home Assistive Devices/Equipment: Eyeglasses,Scales,Blood pressure cuff,Built-in shower seat,Hearing aid,Cane (specify quad or straight),Grab bars in shower,CBG Meter ADL Screening (condition at time of admission) Patient's cognitive ability adequate to safely complete daily activities?: Yes Is the patient deaf or have difficulty hearing?: Yes Does the patient have difficulty seeing, even when wearing glasses/contacts?: No Does the patient have difficulty concentrating, remembering, or making decisions?: No Patient able to express need for assistance with ADLs?: Yes Does the patient have difficulty dressing or bathing?: Yes Independently performs ADLs?: No Communication: Independent Dressing (OT): Needs assistance Is this a change from baseline?: Change from baseline, expected to last <3days Grooming: Independent Feeding: Independent Bathing: Needs assistance Is this a change from baseline?: Change from baseline, expected to last <3 days Toileting: Needs assistance Is this a change from baseline?: Change from baseline, expected to last <3 days In/Out Bed: Needs assistance Is this a change from baseline?: Change from baseline, expected to last <3 days Does the patient have difficulty walking or climbing stairs?: Yes Weakness of Legs: Both Weakness of Arms/Hands: None  Permission Sought/Granted Permission sought to share information with : Case Manager,Facility Contact Representative,Family Supports Permission granted to share information with : Yes, Verbal Permission Granted  Share Information with NAME: Nicki Reaper  Permission granted to share info w AGENCY: SNF  Permission granted to share info w Relationship: brother  Permission granted to share info w Contact Information: Nicki Reaper   [phone]  Emotional Assessment Appearance::  Appears stated age Attitude/Demeanor/Rapport: Gracious Affect (typically observed): Calm Orientation: : Oriented to Self,Oriented to Place,Oriented to  Time,Oriented to Situation Alcohol / Substance Use: Not Applicable Psych Involvement: No (comment)  Admission diagnosis:  S/P CABG x 3 [Z95.1] Patient Active Problem List   Diagnosis Date Noted  . S/P CABG x 3 10/12/2020  . Left ventricular dysfunction   . Atrial fibrillation ([place]) 08/13/2020  . LBBB (left bundle branch block) 08/13/2020  . HFrEF (heart failure with reduced ejection fraction) ([place]) 08/13/2020  . Abnormal echocardiogram 08/13/2020  . Abnormal ventricular wall motion 08/13/2020  . History of CVA (cerebrovascular accident) 08/13/2020  . Type 2 diabetes mellitus with complication, with long-term current use of insulin ([place]) 08/13/2020  . Primary hypertension 08/13/2020  . Mixed hyperlipidemia 08/13/2020  . Chronic kidney disease, stage 3 ([place])   . Cerebral arteriosclerosis with history of previous cerebrovascular accident   . Carotid stenosis   . Systolic CHF ([place])   . Pain management contract agreement 06/27/2019  . DDD (degenerative disc disease), lumbar 02/28/2019  . Lumbar radiculopathy 02/28/2019  . Neck pain 02/28/2019  . Other chronic pain 02/28/2019  . S/P insertion of spinal cord stimulator 02/28/2019  . Status post lumbar laminectomy 02/28/2019   PCP:  Maryella Shivers, MD Pharmacy:   [id number] - [address], [place] [address] Phone: [phone] Fax: [phone]     Social Determinants of Health (SDOH) Interventions    Readmission Risk Interventions No flowsheet data found.

## 2020-10-15 NOTE — Discharge Summary (Signed)
bPhysician Discharge Summary  Patient ID: Thomas Mckee MRN: [id number] DOB/AGE: 78-05-1941 78 y.o.  Admit date: 10/12/2020 Discharge date: 10/19/2020  Admission Diagnoses:  Coronary artery disease History of left bundle branch block History of Chronic Kidney Disease, Stage III Chronic neck and lumbar pain Cerebrovascular disease History of CVA History of atrial fibrillation Systolic congestive heart failure Type 2 diabetes mellitus Hypertension  Discharge Diagnoses:   Coronary artery disease History of atrial fibrillation History of left bundle branch block Post-operative complete heart block History of Chronic Kidney Disease, Stage III Chronic neck and lumbar pain Cerebrovascular disease History of CVA Systolic congestive heart failure Type 2 diabetes mellitus Hypertension S/P CABG x 3 Expected acute blood loss anemia   Discharged Condition: stable  History of Present Illness:  The patient is a 80 year old gentleman with history of hypertension, hyperlipidemia, diabetes, stage III chronic kidney disease, chronic pain secondary to 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  He tells me that he woke up in the morning and tried to turn on the TV but his hand was weak and he could not figure out how to turn the TV on.  He was also having some visual changes.  He went to the emergency department at [facility] and was diagnosed with a stroke.  Apparently CT showed a previous cerebellar stroke.  He was admitted there for further care.  A 2D echocardiogram reportedly showed a depressed ejection fraction with some wall motion abnormalities.  Electrocardiogram showed left bundle branch block.  He was apparently discharged and sent to see Thomas Mckee for cardiology evaluation.  He waited some intermittent chest although he tells me that he is never had any chest pain or pressure and denies any history of shortness of breath.  He  underwent cardiac catheterization by Thomas Mckee on 08/18/2020.  This showed severe multivessel coronary disease.  The LAD had 50% ostial to proximal stenosis followed by 75% proximal and mid LAD stenosis.  This area was diffusely calcified.  There is a small diagonal branch had 80% stenosis.  The left circumflex had mild nonobstructive disease.  The right coronary artery was a large dominant vessel that had 75% stenosis after the posterior descending branch 90% stenosis prior to a moderate size posterior lateral branch.  LVEDP was 17.  Given the long area of stenosis in the proximal LAD that was calcified and his history of diabetes it was felt that coronary bypass surgery may be the best treatment for him and he was referred to me for evaluation.  The patient was also found to be in rate controlled atrial fibrillation when he was seen by Thomas Mckee with no prior history.  He was started on Eliquis 5 mg twice a day due to his CHA2DS2-VASc score of 6.  He tells me today that he has not started taking the Eliquis since he was placed on aspirin and Plavix when he left the hospital and the pharmacy told him that he should not be taking Plavix with the Eliquis.  Course in Hospital: Patient was admitted for elective surgery on 10/12/2020. He was taken to the operating where left carotid endarterectomy with patch closure was accomplished.  This was followed by three-vessel coronary bypass grafting and clipping of the left atrial appendage.  Following procedures, he separated from cardiopulmonary bypass without any difficulty.  He was transferred to the ICU in stable condition.  He remained hemodynamically stable.  He was weaned from the ventilator and  extubated routinely by around 8 PM on the day of surgery.  On postop day 1, he was noted to have sinus bradycardia.  Temporary pacing was continued utilizing epicardial pacing wires.  Beta-blocker was withheld for this reason.  He was mobilized.  Monitoring lines and chest  tubes were removed routinely on the first postoperative day.  Diuresis was begun with IV furosemide. The right neck drain was removed as well. He had no neurologic deficits.  He developed a long first degree heart block with intermittent complete heart block that required epicardial pacing.  We asked the electrophysiology service to evaluate regarding need for permanent pacemaker on post-op day 3. By the following day he had improved conduction and Thomas Mckee did not feel a PPM would be required. He recommended the patient have a cardiac monitor for 2 weeks post discharge. This waws arranged on the day of discharge. Thomas Mckee also reviewed available EKG's from prior to admission and did not see any evidence of atrial fibrillation.  Thomas Mckee was evaluated by the PT and OT teams and was felt to be appropriate for discharge to home with home health PT and a rolling walker.  We arranged for these services along with home health RN and Aide.      Consults: cardiology and electrophysiology  Significant Diagnostic Studies:   LEFT HEART CATH AND CORONARY ANGIOGRAPHY    Conclusion    Ost LAD to Prox LAD lesion is 50% stenosed.  1st Diag lesion is 80% stenosed.  Prox LAD to Mid LAD lesion is 75% stenosed.  RPAV-1 lesion is 75% stenosed.  RPAV-2 lesion is 90% stenosed.  Ramus lesion is 25% stenosed.  LV end diastolic pressure is mildly elevated.  There is no aortic valve stenosis.   Heavily calcified Ostial to mid LAD with severe obstruction in the mid vessel. Very long segment in the LAD would require atherectomy followed by multiple long, overlapping stents, which may be suboptimal treatment in a diabetic patient. Severe disease in the ostial diagonal.  Severe disease in the posterolateral artery.  Given new onset AFib, DM, and heavy calcium, would refer for surgical eval.  Maze and LAA occluder may be beneficial for him as well.   Of note, the patient asked me to call his  ex-wife Thomas Mckee with results.  I spoke to her and she was not interested in the results.  She only asked if I was calling from [place], and I conveyed that he was in [place].  She asked that we never call her again.  I did not mention the actual cath results to her.      ECHOCARDIOGRAM REPORT       Patient Name:  JANET SZWED Date of Exam: 10/02/2020  Medical Rec #: [id number]   Height:    73.0 in  Accession #:  [id number]   Weight:    232.0 lb  Date of Birth: [birth year]   BSA:     2.292 m  Patient Age:  84 years    BP:      120/70 mmHg  Patient Gender: M       HR:      79 bpm.  Exam Location: Inpatient   Procedure: 2D Echo   Indications:  CAD of native vessel    History:    Patient has no prior history of Echocardiogram  examinations.         CAD, chronic kidney disease; Risk Factors:Hypertension,         Dyslipidemia and  Diabetes.    Sonographer:  Johny Chess  Referring Phys: [id number]    1. Left ventricular ejection fraction, by estimation, is 35 to 40%. The  left ventricle has moderately decreased function. The left ventricle  demonstrates regional wall motion abnormalities (see scoring  diagram/findings for description). Left ventricular  diastolic function could not be evaluated.  2. Right ventricular systolic function is normal. The right ventricular  size is normal. Tricuspid regurgitation signal is inadequate for assessing  PA pressure.  3. Left atrial size was mildly dilated.  4. The mitral valve is normal in structure. Trivial mitral valve  regurgitation. No evidence of mitral stenosis. Severe mitral annular  calcification.  5. The aortic valve is tricuspid. There is moderate calcification of the  aortic valve. There is mild thickening of the aortic valve. Aortic valve  regurgitation is not visualized. Mild to moderate aortic valve   sclerosis/calcification is present, without  any evidence of aortic stenosis.  6. The inferior vena cava is normal in size with greater than 50%  respiratory variability, suggesting right atrial pressure of 3 mmHg.   Comparison(s): Prior images unable to be directly viewed, comparison made  by report only. Per notes, study in 07/2020 at [place] similar, but  report/images not available.   FINDINGS  Left Ventricle: Left ventricular ejection fraction, by estimation, is 35  to 40%. The left ventricle has moderately decreased function. The left  ventricle demonstrates regional wall motion abnormalities. The left  ventricular internal cavity size was  normal in size. There is no left ventricular hypertrophy. Abnormal  (paradoxical) septal motion, consistent with left bundle branch block.  Left ventricular diastolic function could not be evaluated due to mitral  annular calcification (moderate or  greater). Left ventricular diastolic function could not be evaluated.     LV Wall Scoring:  The anterior septum, mid inferoseptal segment, and basal inferoseptal  segment  are akinetic. The entire anterior wall, entire inferior wall, apical  septal  segment, and apex are hypokinetic. The entire lateral wall is normal.   Right Ventricle: The right ventricular size is normal. No increase in  right ventricular wall thickness. Right ventricular systolic function is  normal. Tricuspid regurgitation signal is inadequate for assessing PA  pressure.   Left Atrium: Left atrial size was mildly dilated.   Right Atrium: Right atrial size was normal in size.   Pericardium: There is no evidence of pericardial effusion.   Mitral Valve: The mitral valve is normal in structure. Severe mitral  annular calcification. Trivial mitral valve regurgitation. No evidence of  mitral valve stenosis.   Tricuspid Valve: The tricuspid valve is normal in structure. Tricuspid  valve regurgitation is trivial. No  evidence of tricuspid stenosis.   Aortic Valve: The aortic valve is tricuspid. There is moderate  calcification of the aortic valve. There is mild thickening of the aortic  valve. There is moderate aortic valve annular calcification. Aortic valve  regurgitation is not visualized. Mild to  moderate aortic valve sclerosis/calcification is present, without any  evidence of aortic stenosis.   Pulmonic Valve: The pulmonic valve was grossly normal. Pulmonic valve  regurgitation is trivial. No evidence of pulmonic stenosis.   Aorta: The aortic root, ascending aorta and aortic arch are all  structurally normal, with no evidence of dilitation or obstruction.   Venous: The inferior vena cava is normal in size with greater than 50%  respiratory variability, suggesting right atrial pressure of 3 mmHg.   IAS/Shunts:  The atrial septum is grossly normal.     LEFT VENTRICLE  PLAX 2D  LVIDd:     4.50 cm  LVIDs:     3.50 cm  LV PW:     1.00 cm  LV IVS:    0.90 cm  LVOT diam:   2.10 cm  LV SV:     46  LV SV Index:  20  LVOT Area:   3.46 cm    LV Volumes (MOD)  LV vol d, MOD A2C: 69.2 ml  LV vol d, MOD A4C: 83.5 ml  LV vol s, MOD A2C: 29.9 ml  LV vol s, MOD A4C: 53.8 ml  LV SV MOD A2C:   39.3 ml  LV SV MOD A4C:   83.5 ml  LV SV MOD BP:   35.9 ml   RIGHT VENTRICLE      IVC  RV S prime:   8.38 cm/s IVC diam: 1.40 cm  TAPSE (M-mode): 1.8 cm   LEFT ATRIUM       Index    RIGHT ATRIUM      Index  LA diam:    3.00 cm 1.31 cm/m RA Area:   13.60 cm  LA Vol (A2C):  66.2 ml 28.89 ml/m RA Volume:  33.40 ml 14.57 ml/m  LA Vol (A4C):  62.9 ml 27.45 ml/m  LA Biplane Vol: 65.7 ml 28.67 ml/m  AORTIC VALVE  LVOT Vmax:  71.10 cm/s  LVOT Vmean: 45.100 cm/s  LVOT VTI:  0.134 m    AORTA  Ao Root diam: 3.00 cm  Ao Asc diam: 2.90 cm     SHUNTS  Systemic VTI: 0.13 m  Systemic Diam: 2.10 cm   Buford Dresser  MD  Electronically signed by Buford Dresser MD  Signature Date/Time: 10/02/2020/1:39:19 PM    Treatments:   CARDIOVASCULAR SURGERY OPERATIVE NOTE  10/12/2020  Surgeon:  Gaye Pollack, MD  First Assistant: Enid Cutter, PA-C   Preoperative Diagnosis:  Severe multi-vessel coronary artery disease   Postoperative Diagnosis:  Same   Procedure:  1. Median Sternotomy 2. Extracorporeal circulation 3.   Coronary artery bypass grafting x 3   Left internal mammary artery graft to the LAD  SVG to diagonal 1  SVG to PL (RCA)  4.   Endoscopic vein harvest from the right leg 5.   Clipping of left atrial appendage   Anesthesia:  General Endotracheal   Clinical History/Surgical Indication:  This 80 year old gentleman has severe multivessel coronary disease with a long calcified high-grade stenosis in the proximal to mid LAD as well as a high-grade distal right coronary artery stenosis compromising a moderate sized posterior lateral branch. He denies any symptoms whatsoever but with diabetes and a reduced ejection fraction he could be having silent ischemia. He is not felt to be a good PCI candidate given the length and calcification of the stenosis in the proximal to mid LAD. I think his vessels are reasonable for coronary bypass graft surgery and that may prevent further left ventricular deterioration.2D echo shows moderate LV systolic dysfunction with an EF of 35-40%. There is no significant valvular disease.He has rate controlled atrial fibrillation with left bundle branch block morphology. Given his age and increased operative risk and the fact that his atrial fibrillation is rate controlled and he is asymptomatic I do not think a Maze procedure is indicated.I would plan to place a clip on his left atrial appendage.He has a high grade right ICA stenosis with mixed plaque on CTA of  the neck and given his recent TIA symptoms in Feb that sent him  to [facility] Dr. Carlis Abbott is planning right CEA at the same time as CABG.  Discharge Exam: Blood pressure (!) 101/54, pulse 99, temperature 98.1 F (36.7 C), temperature source Oral, resp. rate 20, height 6\' 1"  (1.854 m), weight 105.6 kg, SpO2 94 %.  General appearance: alert, cooperative and no distress Neurologic: intact Heart: Stable rhythm over the weekend, accelerated JR Vs SR with long 1st degree AVB and BBB. Has rates in the 130's with activity. No episodes of bradycardia recorded.  Lungs: Breath sounds are clear. On RA with good sats.  Abdomen: Soft and non-tender.  Extremities: No LE edema, expected bruising right thigh. RLE EVH incision is intact and dry.  Wound: Sternotomy incision is intact and dry.   Disposition:   Discharge Instructions    Amb Referral to Cardiac Rehabilitation   Complete by: As directed    Diagnosis: CABG   CABG X ___: 3 Comment - Referred to [place] [place] cardiac rehab   After initial evaluation and assessments completed: Virtual Based Care may be provided alone or in conjunction with Phase 2 Cardiac Rehab based on patient barriers.: Yes     Allergies as of 10/19/2020      Reactions   Morphine Other (See Comments)   Makes pt dizzy and lightheaded      Medication List    STOP taking these medications   apixaban 5 MG Tabs tablet Commonly known as: ELIQUIS   carvedilol 3.125 MG tablet Commonly known as: COREG   HYDROcodone-acetaminophen 5-325 MG tablet Commonly known as: NORCO/VICODIN   lisinopril 5 MG tablet Commonly known as: ZESTRIL   mupirocin ointment 2 % Commonly known as: BACTROBAN     TAKE these medications   aspirin EC 81 MG tablet Take 81 mg by mouth daily. Swallow whole.   clopidogrel 75 MG tablet Commonly known as: PLAVIX Take 75 mg by mouth daily.   DULoxetine 30 MG capsule Commonly known as: CYMBALTA Take 30 mg by mouth daily.   empagliflozin 25 MG Tabs tablet Commonly known as: JARDIANCE Take 25 mg by mouth  daily.   gabapentin 800 MG tablet Commonly known as: NEURONTIN Take 800 mg by mouth 3 (three) times daily.   Lantus SoloStar 100 UNIT/ML Solostar Pen Generic drug: insulin glargine Inject 44 Units into the skin at bedtime.   metFORMIN 1000 MG tablet Commonly known as: GLUCOPHAGE Take 1,000 mg by mouth 2 (two) times daily with a meal.   Nasal Spray 12 Hour 0.05 % nasal spray Generic drug: oxymetazoline Place 1 spray into both nostrils daily as needed for congestion.   Ozempic (0.25 or 0.5 MG/DOSE) 2 MG/1.5ML Sopn Generic drug: Semaglutide(0.25 or 0.5MG /DOS) Inject 1 mg into the skin once a week.   simvastatin 80 MG tablet Commonly known as: ZOCOR Take 80 mg by mouth at bedtime.   traMADol 50 MG tablet Commonly known as: ULTRAM Take 1 tablet (50 mg total) by mouth every 6 (six) hours as needed for up to 5 days for moderate pain.            Durable Medical Equipment  (From admission, onward)         Start     Ordered   10/18/20 1504  For home use only DME Walker rolling  Once       Comments: rollator  Question Answer Comment  Walker: With 5 Inch Wheels   Patient needs a walker to  treat with the following condition Imbalance      10/18/20 1503          Follow-up Information    Tobb, Kardie, DO. Go on 10/26/2020.   Specialty: Cardiology Why: Your appointment is at 1:20pm Contact information: [place] [address] [phone]        Gaye Pollack, MD. Go on 11/11/2020.   Specialty: Cardiothoracic Surgery Why: Your appointment is at 3:30pm. Please arrive 30 minutes early for a chest x-ray to be performed by [facility] located on the first floor of the same building.   Contact information: [address] [place] [address] [phone]        [name], MD Follow up in 2 week(s).   Specialty: Vascular Surgery Contact information: [place] [address] [phone]        [facility]. Go on 10/26/2020.   Specialty: Cardiothoracic Surgery Why: Your appointment for suture removal is at 10:30am.  Contact information: [place], [place] [phone]              Signed: Antony Odea, PA-C 10/19/2020, 8:18 AM

## 2020-10-15 NOTE — Progress Notes (Addendum)
      [address]       [place] [address]             [phone]      3 Days Post-Op Procedure(s) (LRB): CORONARY ARTERY BYPASS GRAFTING (CABG) X TWO USING LEFT INTERNAL MAMMARY ARTERY AND RIGHT ENDOSCOPIC SAPHENOUS VEIN HARVEST CONDUIT (N/A) CLIPPING OF ATRIAL APPENDAGE (N/A) TRANSESOPHAGEAL ECHOCARDIOGRAM (TEE) (N/A) ENDARTERECTOMY CAROTID USING XENOSURE BIOLOGIC PATCH 1CM X 6CM (Right) Subjective: Up in the bedside chair. Feels he is progressing.   Objective: Vital signs in last 24 hours: Temp:  [97.6 F (36.4 C)-98.8 F (37.1 C)] 97.6 F (36.4 C) (04/28 0645) Pulse Rate:  [55-122] 94 (04/27 1939) Cardiac Rhythm: Ventricular paced;A-V Sequential paced (04/28 0400) Resp:  [7-26] 20 (04/28 0650) BP: (115-141)/(59-92) 124/92 (04/28 0650) SpO2:  [90 %-100 %] 100 % (04/28 0355) Weight:  [106.6 kg] 106.6 kg (04/28 0635)     Intake/Output from previous day: 04/27 0701 - 04/28 0700 In: 640 [P.O.:640] Out: 2950 [Urine:2950] Intake/Output this shift: No intake/output data recorded.  General appearance: alert, cooperative and no distress Neurologic: intact Heart: Pacer on set to DDD at 38. Continues to atrially sense and V-pace at 90-100/min.  Lungs: Breath sounds are clear. On RA with good sats.  Abdomen: Soft and non-tender.  Extremities: No LE edema, expected bruising right thigh. RLE EVH incision is intact and dry.  Wound: Sternotomy dressing removed, the incision is intact and dry.   Lab Results: Recent Labs    10/13/20 1700 10/14/20 0423  WBC 12.1* 10.0  HGB 9.8* 9.1*  HCT 31.2* 28.2*  PLT 126* 106*   BMET:  Recent Labs    10/14/20 0423 10/15/20 0109  NA 132* 136  K 3.7 4.1  CL 100 101  CO2 24 25  GLUCOSE 142* 128*  BUN 21 18  CREATININE 1.14 1.03  CALCIUM 8.1* 8.5*    PT/INR:  Recent Labs    10/12/20 1431  LABPROT 17.9*  INR 1.5*   ABG    Component Value Date/Time   PHART 7.312 (L) 10/12/2020 2120   HCO3 23.9  10/12/2020 2120   TCO2 25 10/12/2020 2120   ACIDBASEDEF 3.0 (H) 10/12/2020 2120   O2SAT 99.0 10/12/2020 2120   CBG (last 3)  Recent Labs    10/14/20 1525 10/14/20 2130 10/15/20 0645  GLUCAP 180* 147* 88    Assessment/Plan: S/P Procedure(s) (LRB): CORONARY ARTERY BYPASS GRAFTING (CABG) X TWO USING LEFT INTERNAL MAMMARY ARTERY AND RIGHT ENDOSCOPIC SAPHENOUS VEIN HARVEST CONDUIT (N/A) CLIPPING OF ATRIAL APPENDAGE (N/A) TRANSESOPHAGEAL ECHOCARDIOGRAM (TEE) (N/A) ENDARTERECTOMY CAROTID USING XENOSURE BIOLOGIC PATCH 1CM X 6CM (Right)  -POD3 CABG / right CEA. Stable BP.  Continue ASA, atorvastatin.   Making slow progress with mobility.   -Bradycardia / heart block- continue pacing. Avoiding B-blocker. Request EP evaluation.   -Volume excess- Diuresed well yesterday, Wt. Down to pre-op level.  -Expected acute blood loss anemia- Hct reasonably stable. Monitor.  -Type 2 DM- controlled with Levemir and SSI  -DVT PPX- continue enoxaparin.    LOS: 3 days    Malon Kindle [phone] 10/15/2020   Chart reviewed, patient examined, agree with above. He looks good overall but rhythm is still 2:1 AV block or CHB with junctional rhythm. With pacer off rate is 50. With pacer on he is sensing atrium and pacing ventricle at 98-100. Will consult EP to see if PPM is indicated.

## 2020-10-15 NOTE — Progress Notes (Signed)
CARDIAC REHAB PHASE I   PRE:  Rate/Rhythm: 99 paced  BP:  Sitting: 124/67      SaO2: 97 RA  MODE:  Ambulation: 295 ft   POST:  Rate/Rhythm: 102 paced  BP:  Sitting: 133/78    SaO2: 97 RA  Pt helped to BR than ambulated 275ft in hallway standby assist with EVA. Pt agreeable to increase distance after some encouragement. Returned to [employer]. Able to demonstrate 1025 on IS. Encouraged continued ambulation and IS use. Bedside table and call bell within reach. Will continue to follow.  [phone] Rufina Falco, RN BSN 10/15/2020 11:12 AM

## 2020-10-15 NOTE — Evaluation (Signed)
Occupational Therapy Evaluation Patient Details Name: Thomas Mckee MRN: [id number] DOB: 06-21-1940 Today's Date: 10/15/2020    History of Present Illness pt is a 80 y/o male presenting 4/25 with right symptomatic carotid stenosis >80% with recent TIA event, scheduled for a CABG.  Same day, pt s/p R carotid endarterectomy , CABG x3 and atrial appendage clipping.  PMHx: CVA, DDD, Lumbar radiculopathy with lami and cord stimulator, R TKA, CKD3   Clinical Impression   Patient admitted for the diagnosis and procedure above.  PTA he was largely independent with ADL/IADL and functional mobility.  He continues to drive and cares for his own meds.  He is planning to hire a PCA for 2 to 3x/wk to assist with home management, meals and community mobility until he believes he is able to manage at home.  He has no family or support system in the area.  Barriers are listed below.  Currently, he is needing up to Mod A for lower body ADL, and up to supervision for in room mobility/toileting.  OT will follow in the acute setting to maximize his functional status for an eventual return home.      Follow Up Recommendations  No OT follow up    Equipment Recommendations  None recommended by OT    Recommendations for Other Services       Precautions / Restrictions Precautions Precautions: Fall;Sternal Precaution Booklet Issued: No Restrictions Other Position/Activity Restrictions: external pacing wires      Mobility Bed Mobility Overal bed mobility: Needs Assistance Bed Mobility: Sit to Supine       Sit to supine: Min assist     Patient Response: Cooperative  Transfers Overall transfer level: Needs assistance   Transfers: Sit to/from Stand Sit to Stand: Supervision         General transfer comment: cued again for sternal precautions and hand placement over his "lap"    Balance Overall balance assessment: Needs assistance Sitting-balance support: No upper extremity supported;Feet  supported Sitting balance-Leahy Scale: Good     Standing balance support: No upper extremity supported Standing balance-Leahy Scale: Fair                             ADL either performed or assessed with clinical judgement   ADL Overall ADL's : Needs assistance/impaired Eating/Feeding: Independent;Sitting   Grooming: Wash/dry hands;Supervision/safety;Standing           Upper Body Dressing : Minimal assistance;Sitting Upper Body Dressing Details (indicate cue type and reason): assist to bring the gown around his back Lower Body Dressing: Moderate assistance;Sitting/lateral leans Lower Body Dressing Details (indicate cue type and reason): difficulty reaching his feet Toilet Transfer: Supervision/safety;Ambulation   Toileting- Clothing Manipulation and Hygiene: Supervision/safety;Sit to/from stand       Functional mobility during ADLs: Supervision/safety       Vision Baseline Vision/History: Wears glasses Wears Glasses: At all times Patient Visual Report: No change from baseline       Perception     Praxis      Pertinent Vitals/Pain Pain Assessment: 0-10 Pain Score: 4  Pain Location: chest Pain Descriptors / Indicators: Discomfort;Guarding Pain Intervention(s): Monitored during session     Hand Dominance Right   Extremity/Trunk Assessment Upper Extremity Assessment Upper Extremity Assessment: Overall WFL for tasks assessed   Lower Extremity Assessment Lower Extremity Assessment: Defer to PT evaluation   Cervical / Trunk Assessment Cervical / Trunk Assessment: Normal   Communication Communication Communication: No difficulties  Cognition Arousal/Alertness: Awake/alert Behavior During Therapy: WFL for tasks assessed/performed Overall Cognitive Status: Within Functional Limits for tasks assessed                                                      Home Living Family/patient expects to be discharged to:: Private  residence Living Arrangements: Alone Available Help at Discharge: Friend(s);Available PRN/intermittently Type of Home: House Home Access: Ramped entrance     Home Layout: One level     Bathroom Shower/Tub: Occupational psychologist: Standard Bathroom Accessibility: Yes How Accessible: Accessible via walker Home Equipment: Walker - standard;Shower seat;Adaptive equipment Adaptive Equipment: Sock aid        Prior Functioning/Environment Level of Independence: Independent        Comments: Independent with ADL and IADL.  Does have someone clean the house 2x/month.  Drives and manages his own meds.        OT Problem List: Decreased activity tolerance;Impaired balance (sitting and/or standing);Pain      OT Treatment/Interventions: Self-care/ADL training;DME and/or AE instruction;Balance training;Therapeutic activities    OT Goals(Current goals can be found in the care plan section) Acute Rehab OT Goals Patient Stated Goal: return home and recover OT Goal Formulation: With patient Time For Goal Achievement: 10/29/20 ADL Goals Pt Will Perform Grooming: with modified independence;standing;sitting Pt Will Perform Lower Body Bathing: with modified independence;sit to/from stand Pt Will Perform Lower Body Dressing: with modified independence;sit to/from stand Pt Will Transfer to Toilet: regular height toilet;ambulating;with modified independence Pt Will Perform Toileting - Clothing Manipulation and hygiene: with modified independence;sit to/from stand  OT Frequency: Min 2X/week   Barriers to D/C:    none noted       Co-evaluation              AM-PAC OT "6 Clicks" Daily Activity     Outcome Measure Help from another person eating meals?: None Help from another person taking care of personal grooming?: None Help from another person toileting, which includes using toliet, bedpan, or urinal?: None Help from another person bathing (including washing, rinsing,  drying)?: A Little Help from another person to put on and taking off regular upper body clothing?: A Little Help from another person to put on and taking off regular lower body clothing?: A Lot 6 Click Score: 20   End of Session Nurse Communication: Mobility status  Activity Tolerance: Patient tolerated treatment well Patient left: in bed;with call bell/phone within reach;with bed alarm set  OT Visit Diagnosis: Unsteadiness on feet (R26.81);Pain Pain - Right/Left:  (chest)                Time: 1247-1310 OT Time Calculation (min): 23 min Charges:  OT General Charges $OT Visit: 1 Visit OT Evaluation $OT Eval Moderate Complexity: 1 Mod OT Treatments $Self Care/Home Management : 8-22 mins  10/15/2020  Rich, OTR/L  Acute Rehabilitation Services  Office:  [place] 10/15/2020, 1:56 PM

## 2020-10-15 NOTE — Consult Note (Addendum)
Cardiology Consultation:   Patient ID: Thomas Mckee MRN: [id number]; DOB: 1941/02/22  Admit date: 10/12/2020 Date of Consult: 10/15/2020  PCP:  Thomas Mckee, [place]  Cardiologist:  Thomas Salines, DO  :[id number]}    Patient Profile:   Thomas Mckee is a 80 y.o. male with a hx of CAD, AFib, HTN, HLD, DM, CKD (III), stroke chronic back pain, ICM, chronic CHF, LBBB who is being seen today for the evaluation of advanced heart block post op at the request of Thomas Mckee.  Afib Hx Diagnosed Feb 2022  AAD None to date  History of Present Illness:   Mr. Thomas Mckee was admitted 4//25/22 for surgery.   He underwent   PROCEDURE:   1.  right carotid endarterectomy with bovine patch angioplasty 2.  right intraoperative carotid ultrasound AND Coronary artery bypass grafting x 3 Left internal mammary artery graft to the LAD SVG to diagonal 1 SVG to PL (RCA)   Endoscopic vein harvest from the right leg               Clipping of left atrial appendage Post op has required pacing via his epicardial wires for advanced heart block  EP is asked to see him today now POD #3 with ongoing heart block and bradycardia  LABS K+ 4.1 BUN/Creat 18/1.03 Yesterday WBC 10.0 H/H 9/28 (stable post op) plts 106  Home meds included coreg 3.125mg  BID No nodal blocking agents here Off pressors since yesterday  The patient is doing well, getting ready to ambulate with card rehab.  Denies much post op pain, no SOB   Past Medical History:  Diagnosis Date   Abnormal echocardiogram 08/13/2020   Abnormal ventricular wall motion 08/13/2020   Atrial fibrillation ([place]) 08/13/2020   Cancer (HCC)    Skin Cancer on legs per patient 2021   Carotid stenosis    Cerebral arteriosclerosis with history of previous cerebrovascular accident    Chronic kidney disease, stage 3 ([place])    DDD (degenerative disc disease), lumbar 02/28/2019   Last Assessment & Plan:  Formatting of this  note might be different from the original. See status post lumbar laminectomy plan   HFrEF (heart failure with reduced ejection fraction) ([place]) 08/13/2020   History of CVA (cerebrovascular accident) 08/13/2020   LBBB (left bundle branch block) 08/13/2020   Left ventricular dysfunction    Lumbar radiculopathy 02/28/2019   Last Assessment & Plan:  Formatting of this note might be different from the original. Continue 800 mg gabapentin t.i.d..  Unable to obtain Lyrica due to cost   Mixed hyperlipidemia 08/13/2020   Neck pain 02/28/2019   Last Assessment & Plan:  Formatting of this note might be different from the original. Chronic neck pain with radiation to the bilateral shoulders, left greater than right.  He reports having plain films completed by chiropractor do not have access to these.  Thomas Mckee consider updating imaging for possible procedural consideration of persistent symptoms.   Other chronic pain 02/28/2019   Pain management contract agreement 06/27/2019   Last Assessment & Plan:  Formatting of this note might be different from the original. Prior UDS results are consistent with regimen, [place] controlled substance registry is checked and is also consistent.   Primary hypertension 08/13/2020   S/P insertion of spinal cord stimulator 02/28/2019   Last Assessment & Plan:  Formatting of this note might be different from the original. He has established with [employer] rep who is helping him to make  adjustments to his programming accordingly.  He needs to follow-up with them as directed   Status post lumbar laminectomy 02/28/2019   Last Assessment & Plan:  Formatting of this note is different from the original. 80 year old male with chronic low back and bilateral lower extremity radicular symptoms status post lumbar laminectomy with subsequent spinal cord stimulator placement.  Recently updated plain films of the thoracic and lumbar spine show moderate lower lumbar facet arthrosis with indwelling Thoracic  stimulator leads ce   Stroke ([place])    Mini stroke per patient [id number]   Systolic CHF ([place])    Type 2 diabetes mellitus with complication, with long-term current use of insulin ([place]) 08/13/2020    Past Surgical History:  Procedure Laterality Date   BACK SURGERY     CLIPPING OF ATRIAL APPENDAGE N/A 10/12/2020   Procedure: CLIPPING OF ATRIAL APPENDAGE;  Surgeon: Thomas Pollack, MD;  Location: [facility];  Service: Open Heart Surgery;  Laterality: N/A;   CORONARY ARTERY BYPASS GRAFT N/A 10/12/2020   Procedure: CORONARY ARTERY BYPASS GRAFTING (CABG) X TWO USING LEFT INTERNAL MAMMARY ARTERY AND RIGHT ENDOSCOPIC SAPHENOUS VEIN HARVEST CONDUIT;  Surgeon: Thomas Pollack, MD;  Location: [facility];  Service: Open Heart Surgery;  Laterality: N/A;   ENDARTERECTOMY Right 10/12/2020   Procedure: ENDARTERECTOMY CAROTID USING XENOSURE BIOLOGIC PATCH 1CM X 6CM;  Surgeon: Thomas Heck, MD;  Location: [facility];  Service: Vascular;  Laterality: Right;   LEFT HEART CATH AND CORONARY ANGIOGRAPHY N/A 08/18/2020   Procedure: LEFT HEART CATH AND CORONARY ANGIOGRAPHY;  Surgeon: Thomas Booze, MD;  Location: [facility];  Service: Cardiovascular;  Laterality: N/A;   REPLACEMENT TOTAL KNEE Right    SHOULDER SURGERY     TEE WITHOUT CARDIOVERSION N/A 10/12/2020   Procedure: TRANSESOPHAGEAL ECHOCARDIOGRAM (TEE);  Surgeon: Thomas Pollack, MD;  Location: [facility];  Service: Open Heart Surgery;  Laterality: N/A;     Home Medications:  Prior to Admission medications   Medication Sig Start Date End Date Taking? Authorizing Provider  apixaban (ELIQUIS) 5 MG TABS tablet Take 5 mg by mouth in the morning and at bedtime. 08/12/20  Yes [provider]  aspirin EC 81 MG tablet Take 81 mg by mouth daily. Swallow whole.   Yes [provider]  carvedilol (COREG) 3.125 MG tablet Take 3.125 mg by mouth 2 (two) times daily with a meal.   Yes [provider]  clopidogrel (PLAVIX) 75 MG tablet Take 75 mg  by mouth daily.   Yes [provider]  DULoxetine (CYMBALTA) 30 MG capsule Take 30 mg by mouth daily.   Yes [provider]  empagliflozin (JARDIANCE) 25 MG TABS tablet Take 25 mg by mouth daily.   Yes [provider]  gabapentin (NEURONTIN) 800 MG tablet Take 800 mg by mouth 3 (three) times daily.   Yes [provider]  HYDROcodone-acetaminophen (NORCO/VICODIN) 5-325 MG tablet Take 1 tablet by mouth every 6 (six) hours as needed for moderate pain.   Yes [provider]  LANTUS SOLOSTAR 100 UNIT/ML Solostar Pen Inject 44 Units into the skin at bedtime. 08/04/20  Yes [provider]  lisinopril (ZESTRIL) 5 MG tablet Take 5 mg by mouth daily.   Yes [provider]  metFORMIN (GLUCOPHAGE) 1000 MG tablet Take 1,000 mg by mouth 2 (two) times daily with a meal.   Yes [provider]  mupirocin ointment (BACTROBAN) 2 % Apply 1 application topically 2 (two) times daily. Apply to  inside of both nostrils two times a day for five days. 10/08/20  Yes Thomas Pollack, MD  oxymetazoline (NASAL SPRAY 12 HOUR) 0.05 % nasal spray Place 1 spray into both nostrils daily as needed for congestion.   Yes [provider]  OZEMPIC, 0.25 OR 0.5 MG/DOSE, 2 MG/1.5ML SOPN Inject 1 mg into the skin once a week. 06/26/20  Yes [provider]  simvastatin (ZOCOR) 80 MG tablet Take 80 mg by mouth at bedtime.   Yes [provider]    Inpatient Medications: Scheduled Meds:  aspirin EC  325 mg Oral Daily   atorvastatin  40 mg Oral Daily   Chlorhexidine Gluconate Cloth  6 each Topical Daily   DULoxetine  30 mg Oral Daily   enoxaparin (LOVENOX) injection  40 mg Subcutaneous QHS   furosemide  40 mg Oral Daily   insulin aspart  0-24 Units Subcutaneous TID AC & HS   insulin detemir  30 Units Subcutaneous Daily   mouth rinse  15 mL Mouth Rinse BID   pantoprazole  40 mg Oral QAC breakfast   potassium chloride  20 mEq Oral BID   sodium  chloride flush  3 mL Intravenous Q12H   Continuous Infusions:  sodium chloride     PRN Meds: sodium chloride, acetaminophen, ondansetron **OR** ondansetron (ZOFRAN) IV, oxyCODONE, senna-docusate, sodium chloride flush, traMADol  Allergies:    Allergies  Allergen Reactions   Morphine Other (See Comments)    Makes pt dizzy and lightheaded     Social History:   Social History   Socioeconomic History   Marital status: Divorced    Spouse name: Not on file   Number of children: Not on file   Years of education: Not on file   Highest education level: Not on file  Occupational History   Not on file  Tobacco Use   Smoking status: Former Smoker    Quit date: 1979    Years since quitting: 43.3   Smokeless tobacco: Never Used  Scientific laboratory technician Use: Never used  Substance and Sexual Activity   Alcohol use: Not on file   Drug use: Never   Sexual activity: Not Currently  Other Topics Concern   Not on file  Social History Narrative   Not on file   Social Determinants of Health   Financial Resource Strain: Not on file  Food Insecurity: Not on file  Transportation Needs: Not on file  Physical Activity: Not on file  Stress: Not on file  Social Connections: Not on file  Intimate Partner Violence: Not on file    Family History:   Family History  Problem Relation Age of Onset   Diabetes Father    Diabetes Sister    Cancer Brother      ROS:  Please see the history of present illness.  All other ROS reviewed and negative.     Physical Exam/Data:   Vitals:   10/15/20 0645 10/15/20 0650 10/15/20 0804 10/15/20 0816  BP:  (!) 124/92 129/68   Pulse:      Resp:  20 (!) 25   Temp: 97.6 F (36.4 C)   98 F (36.7 C)  TempSrc: Oral   Oral  SpO2:      Weight:      Height:        Intake/Output Summary (Last 24 hours) at 10/15/2020 1029 Last data filed at 10/15/2020 0804 Gross per 24 hour  Intake 440 ml  Output 2850 ml  Net -  2410 ml   Last 3 Weights 10/15/2020  10/14/2020 10/13/2020  Weight (lbs) 235 lb 1.6 oz 243 lb 6.2 oz 242 lb 3.2 oz  Weight (kg) 106.641 kg 110.4 kg 109.861 kg     Body mass index is 31.02 kg/m.  General:  Well nourished, well developed, in no acute distress HEENT: normal Lymph: no adenopathy Neck: no JVD Endocrine:  No thryomegaly Vascular: No carotid bruits Cardiac:  RRR; no murmurs, gallops or rubs Lungs:  CTA b/l, no wheezing, rhonchi or rales  Abd: soft, nontender Ext: no edema Musculoskeletal:  No deformities, thoracotomy is CDI Skin: warm and dry  Neuro:  No gross focal abnormalities noted Psych:  Normal affect   EKG:  The EKG was personally reviewed and demonstrates:    10/13/20 post op is AV pacing (undersening in A) #2 looks 2:1 AVBlock with right bundle-like morphology # SB 48bpm, 1st degree AVblock, PR 390ms, icRBBB   10/08/20 is SR 85bpm, long 1st degree AVblock 3215ms PR,, LBBB, QRS 148ms 08/12/20: SR 94bpm, 1st degree AVBlock, LBBB (read as AFib)  Telemetry:  Telemetry was personally reviewed and demonstrates:   Appears to be Vpacing tracking the A This AM with loss of capture looked like he had underlying rate 50's, though difficultto see is SB  Relevant CV Studies:  ECHO INTRAOPERATIVE TEE Result Date: 10/12/2020  *INTRAOPERATIVE TRANSESOPHAGEAL REPORT *  Patient Name:   Thomas Mckee Date of Exam: 10/12/2020 Medical Rec #:  [id number]      Height:       73.0 in Accession #:    [id number]     Weight:       233.9 lb Date of Birth:  [birth year]      BSA:          2.30 m Patient Age:    76 years       BP:           138/64 mmHg Patient Gender: M              HR:           92 bpm. Exam Location:  Anesthesiology Transesophogeal exam was perform intraoperatively during surgical procedure. Patient was closely monitored under general anesthesia during the entirety of examination. Indications:     CAD Native Vessel Sonographer:     Mikki Santee RDCS (AE) Performing Phys: Adele Barthel MD Diagnosing Phys: Adele Barthel MD Complications: No known complications during this procedure. POST-OP IMPRESSIONS - Left Ventricle: The left ventricle is unchanged from pre-bypass. - Right Ventricle: The right ventricle appears unchanged from pre-bypass. - Aorta: The aorta appears unchanged from pre-bypass. - Left Atrium: The left atrium appears unchanged from pre-bypass. - Left Atrial Appendage: Unable to visualized after surgical clipping - Aortic Valve: The aortic valve appears unchanged from pre-bypass. - Mitral Valve: No regurgitation visualized. - Tricuspid Valve: The tricuspid valve appears unchanged from pre-bypass. - Pulmonic Valve: The pulmonic valve appears unchanged from pre-bypass. - Interatrial Septum: The interatrial septum appears unchanged from pre-bypass. - Interventricular Septum: The interventricular septum appears unchanged from pre-bypass. - Pericardium: The pericardium appears unchanged from pre-bypass. PRE-OP FINDINGS  Left Ventricle: The left ventricle has mildly reduced systolic function, with an ejection fraction of 45-50%. The cavity size was normal. There is no increase in left ventricular wall thickness. There is no left ventricular hypertrophy. Right Ventricle: The right ventricle has normal systolic function. The cavity was normal. There is no increase in right ventricular wall thickness. Left Atrium: Left atrial  size was normal in size. No left atrial/left atrial appendage thrombus was detected. Right Atrium: Right atrial size was normal in size. Interatrial Septum: No atrial level shunt detected by color flow Doppler. Pericardium: There is no evidence of pericardial effusion. Mitral Valve: The mitral valve is normal in structure. Mitral valve regurgitation is trivial by color flow Doppler. Tricuspid Valve: The tricuspid valve was normal in structure. Tricuspid valve regurgitation was not visualized by color flow Doppler. Aortic Valve: The aortic valve is tricuspid. Aortic valve regurgitation was not  visualized by color flow Doppler. There is no stenosis of the aortic valve. There is moderate thickening and moderate calcification present on the aortic valve non-coronary cusp with severely decreased mobility. Pulmonic Valve: The pulmonic valve was normal in structure. Pulmonic valve regurgitation is trivial by color flow Doppler. Aorta: The aortic root, ascending aorta and aortic arch are normal in size and structure.  Adele Barthel MD Electronically signed by Adele Barthel MD Signature Date/Time: 10/12/2020/1:37:15 PM    Final      10/02/2020: TTE IMPRESSIONS   1. Left ventricular ejection fraction, by estimation, is 35 to 40%. The  left ventricle has moderately decreased function. The left ventricle  demonstrates regional wall motion abnormalities (see scoring  diagram/findings for description). Left ventricular   diastolic function could not be evaluated.   2. Right ventricular systolic function is normal. The right ventricular  size is normal. Tricuspid regurgitation signal is inadequate for assessing  PA pressure.   3. Left atrial size was mildly dilated.   4. The mitral valve is normal in structure. Trivial mitral valve  regurgitation. No evidence of mitral stenosis. Severe mitral annular  calcification.   5. The aortic valve is tricuspid. There is moderate calcification of the  aortic valve. There is mild thickening of the aortic valve. Aortic valve  regurgitation is not visualized. Mild to moderate aortic valve  sclerosis/calcification is present, without  any evidence of aortic stenosis.   6. The inferior vena cava is normal in size with greater than 50%  respiratory variability, suggesting right atrial pressure of 3 mmHg.    10/02/2020: Carotid US  Summary:  Right Carotid: Velocities in the right ICA are consistent with a 1-39%  stenosis.   Left Carotid: Velocities in the left ICA are consistent with a 1-39%  stenosis.  Vertebrals: Bilateral vertebral arteries demonstrate  antegrade flow.    08/18/2020: LHC Ost LAD to Prox LAD lesion is 50% stenosed. 1st Diag lesion is 80% stenosed. Prox LAD to Mid LAD lesion is 75% stenosed. RPAV-1 lesion is 75% stenosed. RPAV-2 lesion is 90% stenosed. Ramus lesion is 25% stenosed. LV end diastolic pressure is mildly elevated. There is no aortic valve stenosis.   Heavily calcified Ostial to mid LAD with severe obstruction in the mid vessel. Very long segment in the LAD would require atherectomy followed by multiple long, overlapping stents, which may be suboptimal treatment in a diabetic patient. Severe disease in the ostial diagonal.  Severe disease in the posterolateral artery.   Given new onset AFib, DM, and heavy calcium, would refer for surgical eval.  Maze and LAA occluder may be beneficial for him as well.      Laboratory Data:  High Sensitivity Troponin:  No results for input(s): TROPONINIHS in the last 720 hours.   Chemistry Recent Labs  Lab 10/13/20 1700 10/14/20 0423 10/15/20 0109  NA 132* 132* 136  K 4.0 3.7 4.1  CL 101 100 101  CO2 24 24 25  GLUCOSE 253* 142* 128*  BUN 21 21 18   CREATININE 1.26* 1.14 1.03  CALCIUM 8.0* 8.1* 8.5*  GFRNONAA 58* >60 >60  ANIONGAP 7 8 10     Recent Labs  Lab 10/08/20 1137  PROT 6.9  ALBUMIN 4.0  AST 27  ALT 30  ALKPHOS 50  BILITOT 0.9   Hematology Recent Labs  Lab 10/13/20 0401 10/13/20 1700 10/14/20 0423  WBC 10.6* 12.1* 10.0  RBC 3.14* 3.30* 3.01*  HGB 9.5* 9.8* 9.1*  HCT 29.0* 31.2* 28.2*  MCV 92.4 94.5 93.7  MCH 30.3 29.7 30.2  MCHC 32.8 31.4 32.3  RDW 14.5 14.7 14.6  PLT PLATELET CLUMPS NOTED ON SMEAR, UNABLE TO ESTIMATE 126* 106*   BNPNo results for input(s): BNP, PROBNP in the last 168 hours.  DDimer No results for input(s): DDIMER in the last 168 hours.   Radiology/Studies:  DG Chest Port 1 View Result Date: 10/14/2020 CLINICAL DATA:  Sore chest.  Open-heart surgery. EXAM: PORTABLE CHEST 1 VIEW COMPARISON:  10/13/2020. FINDINGS:  Interim removal of Swan-Ganz catheter, mediastinal drainage catheter, left chest tube. Left IJ sheath in stable position. Prior CABG. Left atrial appendage clip in stable position. Stable cardiomegaly. Low lung volumes with mild bibasilar atelectasis again noted. No prominent pleural effusion. No pneumothorax. IMPRESSION: 1. Interim removal of Swan-Ganz catheter, mediastinal drainage catheter, left chest tube. Left IJ sheath in stable position. No pneumothorax. 2.  Prior CABG.  Stable cardiomegaly. 3.  Low lung volumes with mild bibasilar atelectasis again noted. Electronically Signed   By: Marcello Moores  Register   On: 10/14/2020 06:33      Assessment and Plan:   1. Heart block/bradycardia post carotid endarterectomy/CABG/LLL clip     POD #3 today      He has baseline conduction system disease with LBBB and long 1st degree AVblock Post op yesterday SB  icRBBB same long 1st degree  I think he Shaneal Barasch need pacing Eon Zunker get EKG today Review EKGs and telemetry with Dr. Curt Bears Pending his recommendations  Unlikely to go today  2. New dx of Afib in Feb     I find 2 feb EKGs, neither appear to be Afib by my evaluation     Tandre Conly follow   Risk Assessment/Risk Scores:  { For questions or updates, please contact [facility] Please consult [email or web address] for contact info under    Signed, Baldwin Jamaica, PA-C  10/15/2020 10:29 AM  I have seen and examined this patient with Tommye Standard.  Agree with above, note added to reflect my findings.  On exam, RRR, no murmurs.  Patient is postop day 3 from CABG and CEA.  He unfortunately developed complete heart block.  He has a history of a long first-degree AV block and left bundle branch block.  He is now conducting with a long first-degree AV block and a right bundle branch block.  Should his conduction not improved by tomorrow, pacemaker implant would be reasonable.  Jader Desai M. Hyder Deman MD 10/15/2020 12:25 PM

## 2020-10-15 NOTE — Progress Notes (Addendum)
[facility]  Subjective  - doing well and feeling better each day.   Objective (!) 124/92 94 97.6 F (36.4 C) (Oral) 20 100%  Intake/Output Summary (Last 24 hours) at 10/15/2020 0703 Last data filed at 10/15/2020 0657 Gross per 24 hour  Intake 640 ml  Output 2950 ml  Net -2310 ml    Right neck incision healing well with mild incision edema and no [place] hematoma Moving all 4 ext. No tongue deviation or facial droop no neurologic deficits  Assessment/Planning: POD #3 right CEA with CABG combo  Stable post op course from a vascular point of view   Thomas Mckee 10/15/2020 7:03 AM --  Laboratory Lab Results: Recent Labs    10/13/20 1700 10/14/20 0423  WBC 12.1* 10.0  HGB 9.8* 9.1*  HCT 31.2* 28.2*  PLT 126* 106*   BMET Recent Labs    10/14/20 0423 10/15/20 0109  NA 132* 136  K 3.7 4.1  CL 100 101  CO2 24 25  GLUCOSE 142* 128*  BUN 21 18  CREATININE 1.14 1.03  CALCIUM 8.1* 8.5*    COAG Lab Results  Component Value Date   INR 1.5 (H) 10/12/2020   INR 1.0 10/08/2020   No results found for: PTT   I have seen and evaluated the patient. I agree with the PA note as documented above.  Postop day 3 status post right carotid endarterectomy for high grade symptomatic stenosis in combination with coronary bypass.  Continues to look good this morning and awaiting bed on the floor.  Neuro intact.  Right neck incision looks good and drain has since been removed.  No immediate vascular concerns.  Aspirin statin ordered.  [name], MD [facility] Office: [phone]

## 2020-10-15 NOTE — Progress Notes (Signed)
PT Cancellation Note  Patient Details Name: Thomas Mckee MRN: [id number] DOB: [birth year]   Cancelled Treatment:    Reason Eval/Treat Not Completed: Other (comment) (attempted to see pt x 2. At 0730 pt fatigued and wanting to return to bed. 1056 pt walking with cardiac rehab)   Angeliah Wisdom B Cordarrius Coad 10/15/2020, 11:02 AM  Bayard Males, PT Acute Rehabilitation Services Pager: [phone] Office: [phone]

## 2020-10-15 NOTE — Progress Notes (Signed)
TCTS Evening Rounds  POD # 3 s/p CABG/CEA Pacer dependent No complaints BP 135/79   Pulse 94   Temp 98.4 F (36.9 C) (Oral)   Resp 17   Ht 6\' 1"  (1.854 m)   Wt 106.6 kg   SpO2 100%   BMI 31.02 kg/m  Alert/oriented CTA RRR  Intake/Output Summary (Last 24 hours) at 10/15/2020 1851 Last data filed at 10/15/2020 1600 Gross per 24 hour  Intake 640 ml  Output 3400 ml  Net -2760 ml   A/p: doing well Await EP decision on PPM Dezyre Hoefer Z. Orvan Seen, [place]

## 2020-10-16 DIAGNOSIS — I441 Atrioventricular block, second degree: Secondary | ICD-10-CM | POA: Diagnosis not present

## 2020-10-16 DIAGNOSIS — I4891 Unspecified atrial fibrillation: Secondary | ICD-10-CM | POA: Diagnosis not present

## 2020-10-16 LAB — GLUCOSE, CAPILLARY
Glucose-Capillary: 132 mg/dL — ABNORMAL HIGH (ref 70–99)
Glucose-Capillary: 130 mg/dL — ABNORMAL HIGH (ref 70–99)
Glucose-Capillary: 257 mg/dL — ABNORMAL HIGH (ref 70–99)
Glucose-Capillary: 138 mg/dL — ABNORMAL HIGH (ref 70–99)

## 2020-10-16 NOTE — Progress Notes (Addendum)
Progress Note  Patient Name: Rudell Ortman Date of Encounter: 10/16/2020  [facility] Cardiologist: Berniece Salines, DO   Subjective   Feels good  Inpatient Medications    Scheduled Meds:  aspirin EC  325 mg Oral Daily   atorvastatin  40 mg Oral Daily   DULoxetine  30 mg Oral Daily   enoxaparin (LOVENOX) injection  40 mg Subcutaneous QHS   furosemide  40 mg Oral Daily   insulin aspart  0-24 Units Subcutaneous TID AC & HS   insulin detemir  30 Units Subcutaneous Daily   mouth rinse  15 mL Mouth Rinse BID   pantoprazole  40 mg Oral QAC breakfast   potassium chloride  20 mEq Oral BID   sodium chloride flush  3 mL Intravenous Q12H   Continuous Infusions:  sodium chloride     PRN Meds: sodium chloride, acetaminophen, ondansetron **OR** ondansetron (ZOFRAN) IV, oxyCODONE, senna-docusate, sodium chloride flush, traMADol   Vital Signs    Vitals:   10/15/20 2006 10/15/20 2340 10/16/20 0340 10/16/20 0822  BP: 134/73 116/68 94/83 113/69  Pulse:  95 87 91  Resp: 14 16 19 14   Temp: 98.1 F (36.7 C) 98 F (36.7 C) 98.4 F (36.9 C) 97.9 F (36.6 C)  TempSrc: Oral Oral Oral Oral  SpO2:  98% 97% 96%  Weight:      Height:        Intake/Output Summary (Last 24 hours) at 10/16/2020 8756 Last data filed at 10/15/2020 1924 Gross per 24 hour  Intake 200 ml  Output 2000 ml  Net -1800 ml   Last 3 Weights 10/15/2020 10/14/2020 10/13/2020  Weight (lbs) 235 lb 1.6 oz 243 lb 6.2 oz 242 lb 3.2 oz  Weight (kg) 106.641 kg 110.4 kg 109.861 kg      Telemetry    SR/Vpacing >> SR  - Personally Reviewed  ECG    As below - Personally Reviewed  Physical Exam   GEN: No acute distress.   Neck: No JVD Cardiac: RRR, no murmurs, rubs, or gallops.  Respiratory: CTA b/l. GI: Soft, nontender, non-distended  MS: No edema; No deformity. Neuro:  Nonfocal  Psych: Normal affect   Labs    High Sensitivity Troponin:  No results for input(s): TROPONINIHS in the last 720 hours.     Chemistry Recent Labs  Lab 10/13/20 1700 10/14/20 0423 10/15/20 0109  NA 132* 132* 136  K 4.0 3.7 4.1  CL 101 100 101  CO2 24 24 25   GLUCOSE 253* 142* 128*  BUN 21 21 18   CREATININE 1.26* 1.14 1.03  CALCIUM 8.0* 8.1* 8.5*  GFRNONAA 58* >60 >60  ANIONGAP 7 8 10      Hematology Recent Labs  Lab 10/13/20 0401 10/13/20 1700 10/14/20 0423  WBC 10.6* 12.1* 10.0  RBC 3.14* 3.30* 3.01*  HGB 9.5* 9.8* 9.1*  HCT 29.0* 31.2* 28.2*  MCV 92.4 94.5 93.7  MCH 30.3 29.7 30.2  MCHC 32.8 31.4 32.3  RDW 14.5 14.7 14.6  PLT PLATELET CLUMPS NOTED ON SMEAR, UNABLE TO ESTIMATE 126* 106*    BNPNo results for input(s): BNP, PROBNP in the last 168 hours.   DDimer No results for input(s): DDIMER in the last 168 hours.   Radiology    No results found.  Cardiac Studies   ECHO INTRAOPERATIVE TEE Result Date: 10/12/2020  *INTRAOPERATIVE TRANSESOPHAGEAL REPORT *  Patient Name:   KAREN KINNARD Date of Exam: 10/12/2020 Medical Rec #:  [id number]      Height:  73.0 in Accession #:    [id number]     Weight:       233.9 lb Date of Birth:  1940/12/18      BSA:          2.30 m Patient Age:    32 years       BP:           138/64 mmHg Patient Gender: M              HR:           92 bpm. Exam Location:  Anesthesiology Transesophogeal exam was perform intraoperatively during surgical procedure. Patient was closely monitored under general anesthesia during the entirety of examination. Indications:     CAD Native Vessel Sonographer:     Mikki Santee RDCS (AE) Performing Phys: Adele Barthel MD Diagnosing Phys: Adele Barthel MD Complications: No known complications during this procedure. POST-OP IMPRESSIONS - Left Ventricle: The left ventricle is unchanged from pre-bypass. - Right Ventricle: The right ventricle appears unchanged from pre-bypass. - Aorta: The aorta appears unchanged from pre-bypass. - Left Atrium: The left atrium appears unchanged from pre-bypass. - Left Atrial Appendage: Unable to  visualized after surgical clipping - Aortic Valve: The aortic valve appears unchanged from pre-bypass. - Mitral Valve: No regurgitation visualized. - Tricuspid Valve: The tricuspid valve appears unchanged from pre-bypass. - Pulmonic Valve: The pulmonic valve appears unchanged from pre-bypass. - Interatrial Septum: The interatrial septum appears unchanged from pre-bypass. - Interventricular Septum: The interventricular septum appears unchanged from pre-bypass. - Pericardium: The pericardium appears unchanged from pre-bypass. PRE-OP FINDINGS  Left Ventricle: The left ventricle has mildly reduced systolic function, with an ejection fraction of 45-50%. The cavity size was normal. There is no increase in left ventricular wall thickness. There is no left ventricular hypertrophy. Right Ventricle: The right ventricle has normal systolic function. The cavity was normal. There is no increase in right ventricular wall thickness. Left Atrium: Left atrial size was normal in size. No left atrial/left atrial appendage thrombus was detected. Right Atrium: Right atrial size was normal in size. Interatrial Septum: No atrial level shunt detected by color flow Doppler. Pericardium: There is no evidence of pericardial effusion. Mitral Valve: The mitral valve is normal in structure. Mitral valve regurgitation is trivial by color flow Doppler. Tricuspid Valve: The tricuspid valve was normal in structure. Tricuspid valve regurgitation was not visualized by color flow Doppler. Aortic Valve: The aortic valve is tricuspid. Aortic valve regurgitation was not visualized by color flow Doppler. There is no stenosis of the aortic valve. There is moderate thickening and moderate calcification present on the aortic valve non-coronary cusp with severely decreased mobility. Pulmonic Valve: The pulmonic valve was normal in structure. Pulmonic valve regurgitation is trivial by color flow Doppler. Aorta: The aortic root, ascending aorta and aortic arch  are normal in size and structure.  Adele Barthel MD Electronically signed by Adele Barthel MD Signature Date/Time: 10/12/2020/1:37:15 PM    Final         10/02/2020: TTE IMPRESSIONS   1. Left ventricular ejection fraction, by estimation, is 35 to 40%. The  left ventricle has moderately decreased function. The left ventricle  demonstrates regional wall motion abnormalities (see scoring  diagram/findings for description). Left ventricular   diastolic function could not be evaluated.   2. Right ventricular systolic function is normal. The right ventricular  size is normal. Tricuspid regurgitation signal is inadequate for assessing  PA pressure.   3. Left atrial size was mildly  dilated.   4. The mitral valve is normal in structure. Trivial mitral valve  regurgitation. No evidence of mitral stenosis. Severe mitral annular  calcification.   5. The aortic valve is tricuspid. There is moderate calcification of the  aortic valve. There is mild thickening of the aortic valve. Aortic valve  regurgitation is not visualized. Mild to moderate aortic valve  sclerosis/calcification is present, without  any evidence of aortic stenosis.   6. The inferior vena cava is normal in size with greater than 50%  respiratory variability, suggesting right atrial pressure of 3 mmHg.      10/02/2020: Carotid US  Summary:  Right Carotid: Velocities in the right ICA are consistent with a 1-39%  stenosis.   Left Carotid: Velocities in the left ICA are consistent with a 1-39%  stenosis.  Vertebrals: Bilateral vertebral arteries demonstrate antegrade flow.      08/18/2020: LHC Ost LAD to Prox LAD lesion is 50% stenosed. 1st Diag lesion is 80% stenosed. Prox LAD to Mid LAD lesion is 75% stenosed. RPAV-1 lesion is 75% stenosed. RPAV-2 lesion is 90% stenosed. Ramus lesion is 25% stenosed. LV end diastolic pressure is mildly elevated. There is no aortic valve stenosis.   Heavily calcified Ostial to mid LAD with  severe obstruction in the mid vessel. Very long segment in the LAD would require atherectomy followed by multiple long, overlapping stents, which may be suboptimal treatment in a diabetic patient. Severe disease in the ostial diagonal.  Severe disease in the posterolateral artery.   Given new onset AFib, DM, and heavy calcium, would refer for surgical eval.  Maze and LAA occluder may be beneficial for him as well.     Patient Profile     80 y.o. male male with a hx of CAD, HTN, HLD, DM, CKD (III), stroke chronic back pain, ICM, chronic CHF, LBBB   Afib Hx Diagnosed Feb 2022 >> I fond one EKG machine read w/Afib, though in my review was SR   AAD None to date  Admitted 10/12/20 for  PROCEDURE:   1.  right carotid endarterectomy with bovine patch angioplasty 2.  right intraoperative carotid ultrasound AND Coronary artery bypass grafting x 3 Left internal mammary artery graft to the LAD SVG to diagonal 1 SVG to PL (RCA)   Endoscopic vein harvest from the right leg               Clipping of left atrial appendage Post op has required pacing via his epicardial wires for advanced heart block   Assessment & Plan    1. Heart block/bradycardia post carotid endarterectomy/CABG/LAA clip     POD #4 today      He has baseline conduction system disease with LBBB and long 1st degree AVblock Post op is a  icRBBB same long 1st degree  TODAY He is P-tracking with pacing back up With pacing stopped he is SR  EKG done with VVI 40 back up, (no pacing) is now again LBBB, difficult though is likely SR with his known long 1st degree AVblock  He appears to be back to his baseline conduction. Dr. Curt Bears has seen and examined the patient, reviewed EKGs and telemetry He has SB50's-SR 80's 1:1 conduction at his baseline 1st degree Avblock, LBBB  No pacer at this time Plan for 2 week monitoring at discharge and EP follow up out patient  OK to pull epicardial wires from EP perspective  I Crystalmarie Yasin make  EP follow up. Pending d/c timing, if he  goes over the weekend we can arrange monitor be mailed to him If he goes home next week, can be placed at time of discharge     2. New dx of Afib in Feb     I find 2 feb EKGs, neither appear to be Afib by my evaluation     maintaining SR on telemetry       For questions or updates, please contact [place] Please consult [email or web address] for contact info under        Signed, Baldwin Jamaica, PA-C  10/16/2020, 9:22 AM    I have seen and examined this patient with Tommye Standard.  Agree with above, note added to reflect my findings.  On exam, RRR, no murmurs.  Fortunately AV conduction has returned.  We Daneshia Tavano hold off on pacemaker implant for now.  We Prabhnoor Ellenberger have him wear a 2-week monitor to determine if he has any high-grade heart block at discharge.  Of note, he is anticoagulated for atrial fibrillation.  I reviewed all of his ECGs and did not see evidence of atrial fibrillation.  This may be addressed by his primary cardiologist as well.  Basil Buffin M. Alexandria Shiflett MD 10/16/2020 12:34 PM

## 2020-10-16 NOTE — Care Management Important Message (Signed)
Important Message  Patient Details  Name: Cannon Arreola MRN: [id number] Date of Birth: 04-09-1941   Medicare Important Message Given:  Yes     Shelda Altes 10/16/2020, 9:25 AM

## 2020-10-16 NOTE — Progress Notes (Addendum)
      [address]       [place] [address]             [phone]      4 Days Post-Op Procedure(s) (LRB): CORONARY ARTERY BYPASS GRAFTING (CABG) X TWO USING LEFT INTERNAL MAMMARY ARTERY AND RIGHT ENDOSCOPIC SAPHENOUS VEIN HARVEST CONDUIT (N/A) CLIPPING OF ATRIAL APPENDAGE (N/A) TRANSESOPHAGEAL ECHOCARDIOGRAM (TEE) (N/A) ENDARTERECTOMY CAROTID USING XENOSURE BIOLOGIC PATCH 1CM X 6CM (Right) Subjective: Transferred to 4E last evening.  Awake and alert, no new concerns. Progressing slowly with mobility.  Now on RA, BM yesterday.   Objective: Vital signs in last 24 hours: Temp:  [98 F (36.7 C)-98.4 F (36.9 C)] 98.4 F (36.9 C) (04/29 0340) Pulse Rate:  [87-95] 87 (04/29 0340) Cardiac Rhythm: Ventricular paced (04/28 2009) Resp:  [14-25] 19 (04/29 0340) BP: (94-135)/(68-83) 94/83 (04/29 0340) SpO2:  [97 %-98 %] 97 % (04/29 0340)     Intake/Output from previous day: 04/28 0701 - 04/29 0700 In: 200 [P.O.:200] Out: 2400 [Urine:2400] Intake/Output this shift: No intake/output data recorded.  General appearance: alert, cooperative and no distress Neurologic: intact Heart: Pacer on set to DDD at 50. Continues to atrially sense and V-pace at 90-100/min. Intrinsic rhythm appears to be accelerated JR in the 90's this AM. Lungs: Breath sounds are clear. On RA with good sats.  Abdomen: Soft and non-tender.  Extremities: No LE edema, expected bruising right thigh. RLE EVH incision is intact and dry.  Wound: Sternotomy dressing removed, the incision is intact and dry.   Lab Results: Recent Labs    10/13/20 1700 10/14/20 0423  WBC 12.1* 10.0  HGB 9.8* 9.1*  HCT 31.2* 28.2*  PLT 126* 106*   BMET:  Recent Labs    10/14/20 0423 10/15/20 0109  NA 132* 136  K 3.7 4.1  CL 100 101  CO2 24 25  GLUCOSE 142* 128*  BUN 21 18  CREATININE 1.14 1.03  CALCIUM 8.1* 8.5*    PT/INR:  No results for input(s): LABPROT, INR in the last 72 hours. ABG     Component Value Date/Time   PHART 7.312 (L) 10/12/2020 2120   HCO3 23.9 10/12/2020 2120   TCO2 25 10/12/2020 2120   ACIDBASEDEF 3.0 (H) 10/12/2020 2120   O2SAT 99.0 10/12/2020 2120   CBG (last 3)  Recent Labs    10/15/20 1612 10/15/20 2115 10/16/20 0601  GLUCAP 146* 127* 132*    Assessment/Plan: S/P Procedure(s) (LRB): CORONARY ARTERY BYPASS GRAFTING (CABG) X TWO USING LEFT INTERNAL MAMMARY ARTERY AND RIGHT ENDOSCOPIC SAPHENOUS VEIN HARVEST CONDUIT (N/A) CLIPPING OF ATRIAL APPENDAGE (N/A) TRANSESOPHAGEAL ECHOCARDIOGRAM (TEE) (N/A) ENDARTERECTOMY CAROTID USING XENOSURE BIOLOGIC PATCH 1CM X 6CM (Right)  -POD4 CABG / right CEA. Stable BP.  Continue ASA, atorvastatin.   Making slow progress with mobility.   -Bradycardia / heart block- appears to have had some recovery, now AJR. Continue pacing. Avoiding B-blocker. Currently NPO for PPM if indicated per EP. .   -Volume excess- No new Wt today. Appears euvolemic.   -Expected acute blood loss anemia- no new lab today.  -Type 2 DM- controlled with Levemir and SSI, Glucose 127-140's.  -DVT PPX- continue enoxaparin.    LOS: 4 days    Malon Kindle [phone] 10/16/2020   Chart reviewed, patient examined, agree with above. Rhythm unchanged with CHB and junctional escape. EP evaluating for PPM. Otherwise is progressing well.

## 2020-10-16 NOTE — Progress Notes (Signed)
Occupational Therapy Treatment Patient Details Name: Thomas Mckee MRN: [id number] DOB: [birth year] Today's Date: 10/16/2020    History of present illness pt is a 80 y/o male presenting 4/25 with right symptomatic carotid stenosis >80% with recent TIA event, scheduled for a CABG.  Same day, pt s/p R carotid endarterectomy , CABG x3 and atrial appendage clipping.  PMHx: CVA, DDD, Lumbar radiculopathy with lami and cord stimulator, R TKA, CKD3   OT comments  Patient demonstrates nice progress toward all patient focused OT goals.  Gentle reminders to push through his knees, and not to reach for the RW to stand.  Patient was able to walk to the bathroom without an AD with up to supervision.  OT had patient don and doff socks, place mesh underwear, and don a hospital gown to simulate dressing task for correct follow through on sternal precautions.  Patient demos good follow through and understanding.  Min VC's for log rolling and sit to stand.  Patient continues to want to go home with PCA assist.    Follow Up Recommendations  No OT follow up    Equipment Recommendations  None recommended by OT    Recommendations for Other Services      Precautions / Restrictions Precautions Precautions: Sternal;Fall Precaution Comments: external pacer       Mobility Bed Mobility Overal bed mobility: Needs Assistance Bed Mobility: Supine to Sit;Sit to Supine     Supine to sit: Min assist Sit to supine: Supervision   General bed mobility comments: cues for sidelying to sit versus trying to use ab mm    Transfers Overall transfer level: Needs assistance   Transfers: Sit to/from Stand Sit to Stand: Supervision              Balance Overall balance assessment: Mild deficits observed, not formally tested                             High Level Balance Comments: Able to walk in room without an AD           ADL either performed or assessed with clinical judgement   ADL        Grooming: Wash/dry hands;Supervision/safety;Standing           Upper Body Dressing : Supervision/safety;Sitting   Lower Body Dressing: Min guard;Sit to/from stand   Toilet Transfer: Supervision/safety;Ambulation   Toileting- Clothing Manipulation and Hygiene: Independent;Sitting/lateral lean       Functional mobility during ADLs: Supervision/safety       Vision       Perception     Praxis      Cognition Arousal/Alertness: Awake/alert Behavior During Therapy: WFL for tasks assessed/performed Overall Cognitive Status: Within Functional Limits for tasks assessed                                 General Comments: still needing reminders for some sternal precautions.        Exercises     Shoulder Instructions       General Comments HR to 104 with toileting task    Pertinent Vitals/ Pain       Pain Assessment: No/denies pain Pain Intervention(s): Monitored during session  Frequency    2x/wk       Progress Toward Goals  OT Goals(current goals can now be found in the care plan section)  Progress towards OT goals: Progressing toward goals  Acute Rehab OT Goals Patient Stated Goal: return home and recover OT Goal Formulation: With patient Time For Goal Achievement: 10/29/20  Plan Discharge plan remains appropriate    Co-evaluation                 AM-PAC OT "6 Clicks" Daily Activity     Outcome Measure   Help from another person eating meals?: None Help from another person taking care of personal grooming?: None Help from another person toileting, which includes using toliet, bedpan, or urinal?: None Help from another person bathing (including washing, rinsing, drying)?: A Little Help from another person to put on and taking off regular upper body clothing?: None Help from another person to put on and taking off regular lower body clothing?: A  Little 6 Click Score: 22    End of Session    OT Visit Diagnosis: Unsteadiness on feet (R26.81)   Activity Tolerance Patient tolerated treatment well   Patient Left in bed;with call bell/phone within reach   Nurse Communication Mobility status        Time: 5038-8828 OT Time Calculation (min): 17 min  Charges: OT General Charges $OT Visit: 1 Visit OT Treatments $Self Care/Home Management : 8-22 mins  10/16/2020  Rich, OTR/L  Acute Rehabilitation Services  Office:  [phone]    Metta Clines 10/16/2020, 4:20 PM

## 2020-10-16 NOTE — Progress Notes (Signed)
Mobility Specialist: Progress Note   10/16/20 1229  Mobility  Activity Ambulated in hall  Level of Assistance Contact guard assist, steadying assist  Assistive Device Four wheel walker  Distance Ambulated (ft) 240 ft  Mobility Response Tolerated well  Mobility performed by Mobility specialist  Bed Position Chair  $Mobility charge 1 Mobility   Pre-Mobility: 99 HR, 99% SpO2 During Mobility: 131 HR Post-Mobility: 108 HR, 139/86 BP, 99% SpO2  Pt asx during ambulation. Pt used RW to start but switched to rollator. Pt said he prefers rollator. Pt back to chair and is ordering his lunch now.  [facility] Jameire Kouba Mobility Specialist Mobility Specialist Phone: [phone]

## 2020-10-16 NOTE — Progress Notes (Signed)
Physical Therapy Treatment Patient Details Name: Thomas Mckee MRN: [id number] DOB: [birth year] Today's Date: 10/16/2020    History of Present Illness pt is a 80 y/o male presenting 4/25 with right symptomatic carotid stenosis >80% with recent TIA event, scheduled for a CABG.  Same day, pt s/p R carotid endarterectomy , CABG x3 and atrial appendage clipping.  PMHx: CVA, DDD, Lumbar radiculopathy with lami and cord stimulator, R TKA, CKD3    PT Comments    Pt making steady progress with mobility. Pt is planning to return home and is hiring assistance. Expect he will be mobilizing well enough for this to be possible.    Follow Up Recommendations  Home health PT;Supervision for mobility/OOB     Equipment Recommendations  Other (comment);Rolling walker with 5" wheels (vs rollator)    Recommendations for Other Services       Precautions / Restrictions Precautions Precautions: Fall;Sternal;Other (comment) Precaution Booklet Issued: No Precaution Comments: external pacer    Mobility  Bed Mobility Overal bed mobility: Needs Assistance Bed Mobility: Rolling;Sidelying to Sit;Sit to Sidelying Rolling: Supervision Sidelying to sit: Supervision     Sit to sidelying: Supervision General bed mobility comments: Pt with good technique. Incr time    Transfers Overall transfer level: Needs assistance Equipment used: 4-wheeled walker Transfers: Sit to/from Stand Sit to Stand: Min guard;From elevated surface         General transfer comment: Verbal cues for hands to knees for transfer  Ambulation/Gait Ambulation/Gait assistance: Min guard Gait Distance (Feet): 225 Feet Assistive device: 4-wheeled walker Gait Pattern/deviations: Step-through pattern;Decreased stride length Gait velocity: decr Gait velocity interpretation: 1.31 - 2.62 ft/sec, indicative of limited community ambulator General Gait Details: Assist for safety. Occasional self corretion to stay closer to  rollator.   Stairs             Wheelchair Mobility    Modified Rankin (Stroke Patients Only)       Balance Overall balance assessment: Needs assistance Sitting-balance support: No upper extremity supported;Feet supported Sitting balance-Leahy Scale: Good     Standing balance support: No upper extremity supported Standing balance-Leahy Scale: Fair                              Cognition Arousal/Alertness: Awake/alert Behavior During Therapy: WFL for tasks assessed/performed Overall Cognitive Status: Within Functional Limits for tasks assessed                                        Exercises      General Comments General comments (skin integrity, edema, etc.): HR to 120 with amb. After treatment at rest in chair HR 52. External pacer set at 40      Pertinent Vitals/Pain Pain Assessment: No/denies pain    Home Living                      Prior Function            PT Goals (current goals can now be found in the care plan section) Acute Rehab PT Goals Patient Stated Goal: return home and recover PT Goal Formulation: With patient Time For Goal Achievement: 10/28/20 Potential to Achieve Goals: Good Progress towards PT goals: Progressing toward goals;Goals met and updated - see care plan    Frequency    Min 3X/week  PT Plan Discharge plan needs to be updated    Co-evaluation              AM-PAC PT "6 Clicks" Mobility   Outcome Measure  Help needed turning from your back to your side while in a flat bed without using bedrails?: A Little Help needed moving from lying on your back to sitting on the side of a flat bed without using bedrails?: A Little Help needed moving to and from a bed to a chair (including a wheelchair)?: A Little Help needed standing up from a chair using your arms (e.g., wheelchair or bedside chair)?: A Little Help needed to walk in hospital room?: A Little Help needed climbing 3-5  steps with a railing? : A Little 6 Click Score: 18    End of Session Equipment Utilized During Treatment: Gait belt Activity Tolerance: Patient tolerated treatment well Patient left: with call bell/phone within reach;in chair;with chair alarm set Nurse Communication: Mobility status PT Visit Diagnosis: Other abnormalities of gait and mobility (R26.89);Muscle weakness (generalized) (M62.81);Difficulty in walking, not elsewhere classified (R26.2)     Time: 2637-8588 (Interrupted for EKG and PA visit) PT Time Calculation (min) (ACUTE ONLY): 40 min  Charges:  $Gait Training: 23-37 mins                     [place] Pager [phone] Office [place] 10/16/2020, 10:26 AM

## 2020-10-16 NOTE — Progress Notes (Addendum)
[facility] [place]  Subjective  - doing well over all.   Objective 94/83 87 98.4 F (36.9 C) (Oral) 19 97%  Intake/Output Summary (Last 24 hours) at 10/16/2020 0736 Last data filed at 10/15/2020 1924 Gross per 24 hour  Intake 200 ml  Output 2400 ml  Net -2200 ml   Right neck incision healing well with min incisional edema, without Conrad hematoma No neurologic deficits   Assessment/Planning: POD # 4 CEA with CABG combo  Stable post op course from a vascular point of view    Roxy Horseman 10/16/2020 7:36 AM --  Laboratory Lab Results: Recent Labs    10/13/20 1700 10/14/20 0423  WBC 12.1* 10.0  HGB 9.8* 9.1*  HCT 31.2* 28.2*  PLT 126* 106*   BMET Recent Labs    10/14/20 0423 10/15/20 0109  NA 132* 136  K 3.7 4.1  CL 100 101  CO2 24 25  GLUCOSE 142* 128*  BUN 21 18  CREATININE 1.14 1.03  CALCIUM 8.1* 8.5*    COAG Lab Results  Component Value Date   INR 1.5 (H) 10/12/2020   INR 1.0 10/08/2020   No results found for: PTT   I have seen and evaluated the patient. I agree with the PA note as documented above.  Postop day 4 status post right carotid endarterectomy in combination with coronary bypass.  Continues to look very good on the floor from a vascular standpoint.  Neuro intact.  Neck incision looks good.  [name], MD [facility] [place] Office: [phone]

## 2020-10-17 ENCOUNTER — Inpatient Hospital Stay (HOSPITAL_COMMUNITY): Payer: HMO

## 2020-10-17 LAB — CBC
HCT: 27.7 % — ABNORMAL LOW (ref 39.0–52.0)
Hemoglobin: 8.9 g/dL — ABNORMAL LOW (ref 13.0–17.0)
MCH: 29.9 pg (ref 26.0–34.0)
MCHC: 32.1 g/dL (ref 30.0–36.0)
MCV: 93 fL (ref 80.0–100.0)
Platelets: 216 10*3/uL (ref 150–400)
RBC: 2.98 MIL/uL — ABNORMAL LOW (ref 4.22–5.81)
RDW: 14.5 % (ref 11.5–15.5)
WBC: 7.7 10*3/uL (ref 4.0–10.5)
nRBC: 0 % (ref 0.0–0.2)

## 2020-10-17 LAB — BASIC METABOLIC PANEL
Anion gap: 11 (ref 5–15)
BUN: 25 mg/dL — ABNORMAL HIGH (ref 8–23)
CO2: 24 mmol/L (ref 22–32)
Calcium: 8.5 mg/dL — ABNORMAL LOW (ref 8.9–10.3)
Chloride: 101 mmol/L (ref 98–111)
Creatinine, Ser: 1.18 mg/dL (ref 0.61–1.24)
GFR, Estimated: >60 mL/min (ref >60)
Glucose, Bld: 94 mg/dL (ref 70–99)
Potassium: 4.1 mmol/L (ref 3.5–5.1)
Sodium: 136 mmol/L (ref 135–145)

## 2020-10-17 LAB — GLUCOSE, CAPILLARY
Glucose-Capillary: 119 mg/dL — ABNORMAL HIGH (ref 70–99)
Glucose-Capillary: 241 mg/dL — ABNORMAL HIGH (ref 70–99)
Glucose-Capillary: 175 mg/dL — ABNORMAL HIGH (ref 70–99)
Glucose-Capillary: 166 mg/dL — ABNORMAL HIGH (ref 70–99)

## 2020-10-17 MED ORDER — INSULIN DETEMIR 100 UNIT/ML ~~LOC~~ SOLN
20.0000 [IU] | Freq: Every day | SUBCUTANEOUS | Status: DC
  Administered 2020-10-17: 20 [IU] via SUBCUTANEOUS
  Filled 2020-10-17 (×2): qty 0.2

## 2020-10-17 MED ORDER — METFORMIN HCL 500 MG PO TABS
1000.0000 mg | ORAL_TABLET | Freq: Two times a day (BID) | ORAL | Status: DC
  Administered 2020-10-17 – 2020-10-19 (×5): 1000 mg via ORAL
  Filled 2020-10-17 (×5): qty 2

## 2020-10-17 NOTE — Progress Notes (Addendum)
[address]       [place] [address]             [phone]      5 Days Post-Op Procedure(s) (LRB): CORONARY ARTERY BYPASS GRAFTING (CABG) X TWO USING LEFT INTERNAL MAMMARY ARTERY AND RIGHT ENDOSCOPIC SAPHENOUS VEIN HARVEST CONDUIT (N/A) CLIPPING OF ATRIAL APPENDAGE (N/A) TRANSESOPHAGEAL ECHOCARDIOGRAM (TEE) (N/A) ENDARTERECTOMY CAROTID USING XENOSURE BIOLOGIC PATCH 1CM X 6CM (Right) Subjective: Feels ok, no specific c/o  Objective: Vital signs in last 24 hours: Temp:  [97.9 F (36.6 C)-98.5 F (36.9 C)] 98.3 F (36.8 C) (04/30 0346) Pulse Rate:  [58-100] 97 (04/30 0346) Cardiac Rhythm: Heart block (04/29 1900) Resp:  [14-19] 19 (04/30 0346) BP: (113-128)/(60-71) 118/68 (04/30 0346) SpO2:  [96 %-99 %] 97 % (04/30 0346)  Hemodynamic parameters for last 24 hours:    Intake/Output from previous day: No intake/output data recorded. Intake/Output this shift: No intake/output data recorded.  General appearance: alert, cooperative and no distress Heart: regular rate and rhythm and + rub Lungs: min dim in bases Abdomen: benign Extremities: no edema Wound: incis healing well. mod thigh echymosis drom EVH  Lab Results: Recent Labs    10/17/20 0102  WBC 7.7  HGB 8.9*  HCT 27.7*  PLT 216   BMET:  Recent Labs    10/15/20 0109 10/17/20 0102  NA 136 136  K 4.1 4.1  CL 101 101  CO2 25 24  GLUCOSE 128* 94  BUN 18 25*  CREATININE 1.03 1.18  CALCIUM 8.5* 8.5*    PT/INR: No results for input(s): LABPROT, INR in the last 72 hours. ABG    Component Value Date/Time   PHART 7.312 (L) 10/12/2020 2120   HCO3 23.9 10/12/2020 2120   TCO2 25 10/12/2020 2120   ACIDBASEDEF 3.0 (H) 10/12/2020 2120   O2SAT 99.0 10/12/2020 2120   CBG (last 3)  Recent Labs    10/16/20 1734 10/16/20 2116 10/17/20 0612  GLUCAP 257* 138* 119*    Meds Scheduled Meds: . aspirin EC  325 mg Oral Daily  . atorvastatin  40 mg Oral Daily  . DULoxetine  30 mg Oral  Daily  . enoxaparin (LOVENOX) injection  40 mg Subcutaneous QHS  . furosemide  40 mg Oral Daily  . insulin aspart  0-24 Units Subcutaneous TID AC & HS  . insulin detemir  30 Units Subcutaneous Daily  . mouth rinse  15 mL Mouth Rinse BID  . pantoprazole  40 mg Oral QAC breakfast  . potassium chloride  20 mEq Oral BID  . sodium chloride flush  3 mL Intravenous Q12H   Continuous Infusions: . sodium chloride     PRN Meds:.sodium chloride, acetaminophen, ondansetron **OR** ondansetron (ZOFRAN) IV, oxyCODONE, senna-docusate, sodium chloride flush, traMADol  Xrays No results found.  Assessment/Plan: S/P Procedure(s) (LRB): CORONARY ARTERY BYPASS GRAFTING (CABG) X TWO USING LEFT INTERNAL MAMMARY ARTERY AND RIGHT ENDOSCOPIC SAPHENOUS VEIN HARVEST CONDUIT (N/A) CLIPPING OF ATRIAL APPENDAGE (N/A) TRANSESOPHAGEAL ECHOCARDIOGRAM (TEE) (N/A) ENDARTERECTOMY CAROTID USING XENOSURE BIOLOGIC PATCH 1CM X 6CM (Right)   1 afeb, VSS, sats good on RA, rhythm appears stable except does get tachy with ambulation, upto 130's. Plan for home monitor at time of D/C 2 excellent diuresis, appears to be at about preop weight 3 stable renal fxn in normal range, d/c lasix and potassium, mo significant evidence of volume overload currently 4 CBG  a bit variable- Results for TRAY, KLAYMAN (MRN [id number]) as of 10/17/2020 07:20  Ref. Range 10/15/2020 11:24 10/15/2020 16:12 10/15/2020 21:15 10/16/2020 06:01 10/16/2020 11:23 10/16/2020 17:34 10/16/2020 21:16 10/17/2020 06:12  Glucose-Capillary Latest Ref Range: 70 - 99 mg/dL 135 (H) 146 (H) 127 (H) 132 (H) 130 (H) 257 (H) 138 (H) 119 (H)  On jardiance, metformin and Ozempic preop Will resume glucophage and transition to home meds over time- decrease Levemir  5 will d/c epw's today 6 cont cardiac rehab and PT as well as pulm toilet- needs walker and assistance during ambulation 7 TOC assisting with home needs arrangements- declines SNF and says care giver can stay with him  full time. Placed face to face for PT/HHRN/Aide 8 minor atx on CXR    LOS: 5 days    John Giovanni PA-C Pager [phone] 10/17/2020  Agree with above. Dispo planning.  Lynae Pederson Bary Leriche

## 2020-10-17 NOTE — Progress Notes (Signed)
Epicardial pacing wires removed per order at 0912 after patient hand eaten breakfast and ambulated in hall with mobility tech.  Prior to removing epicardial pacing wires pacing d/c with patient's HR in the 90's.  Patient tolerated procedure well VS q61min with 1 hr bedrest.

## 2020-10-17 NOTE — Progress Notes (Signed)
CARDIAC REHAB PHASE I   PRE:  Rate/Rhythm:   BP:  Supine:   Sitting:   Standing:    SaO2:   MODE:  Ambulation:  ft   POST:  Rate/Rhythm:   BP:  Supine:   Sitting:   Standing:    SaO2:  Patient has walked with mobility specialist before pacing wires were pulled this morning.  Discharge instructions completed with patient re: sternal precautions, activity restrictions, exercise progression, incentive spirometry use, heart healthy dietary choices, signs of infection, when to call the surgeon, importance of daily weights, referred to phase II cardiac rehab in [place].  Patient was not extremely engaged with the educations, did not ask many questions, seemed irritated with the information. [phone] Liliane Channel RN, BSN 10/17/2020 10:06 AM

## 2020-10-17 NOTE — Progress Notes (Signed)
Mobility Specialist - Progress Note   10/17/20 0850  Mobility  Activity Ambulated in hall  Level of Assistance Standby assist, set-up cues, supervision of patient - no hands on  Assistive Device Four wheel walker  Distance Ambulated (ft) 200 ft  Mobility Response Tolerated well  Mobility performed by Mobility specialist  $Mobility charge 1 Mobility   Pre-mobility: 77 HR During mobility: 108 HR Post-mobility: 101 HR  Pt reports he feels better compared to yesterday when ambulating. Pt laying in bed after walk to wait for his wires to be removed.   Pricilla Handler Mobility Specialist Mobility Specialist Phone: [phone]

## 2020-10-18 DIAGNOSIS — E119 Type 2 diabetes mellitus without complications: Secondary | ICD-10-CM | POA: Diagnosis not present

## 2020-10-18 DIAGNOSIS — E785 Hyperlipidemia, unspecified: Secondary | ICD-10-CM | POA: Diagnosis not present

## 2020-10-18 LAB — GLUCOSE, CAPILLARY
Glucose-Capillary: 150 mg/dL — ABNORMAL HIGH (ref 70–99)
Glucose-Capillary: 186 mg/dL — ABNORMAL HIGH (ref 70–99)
Glucose-Capillary: 164 mg/dL — ABNORMAL HIGH (ref 70–99)
Glucose-Capillary: 203 mg/dL — ABNORMAL HIGH (ref 70–99)

## 2020-10-18 MED ORDER — INSULIN DETEMIR 100 UNIT/ML ~~LOC~~ SOLN
15.0000 [IU] | Freq: Every day | SUBCUTANEOUS | Status: DC
  Administered 2020-10-18 – 2020-10-19 (×2): 15 [IU] via SUBCUTANEOUS
  Filled 2020-10-18 (×2): qty 0.15

## 2020-10-18 MED ORDER — CARVEDILOL 3.125 MG PO TABS: 3.1250 mg | ORAL_TABLET | Freq: Two times a day (BID) | ORAL | Status: DC

## 2020-10-18 MED ORDER — EMPAGLIFLOZIN 25 MG PO TABS
25.0000 mg | ORAL_TABLET | Freq: Every day | ORAL | Status: DC
  Administered 2020-10-18 – 2020-10-19 (×2): 25 mg via ORAL
  Filled 2020-10-18 (×2): qty 1

## 2020-10-18 NOTE — Progress Notes (Addendum)
[address]       [place] [address]             [phone]      6 Days Post-Op Procedure(s) (LRB): CORONARY ARTERY BYPASS GRAFTING (CABG) X TWO USING LEFT INTERNAL MAMMARY ARTERY AND RIGHT ENDOSCOPIC SAPHENOUS VEIN HARVEST CONDUIT (N/A) CLIPPING OF ATRIAL APPENDAGE (N/A) TRANSESOPHAGEAL ECHOCARDIOGRAM (TEE) (N/A) ENDARTERECTOMY CAROTID USING XENOSURE BIOLOGIC PATCH 1CM X 6CM (Right) Subjective: Feels well overall, getting stronger  Objective: Vital signs in last 24 hours: Temp:  [98.1 F (36.7 C)-98.5 F (36.9 C)] 98.1 F (36.7 C) (05/01 0338) Pulse Rate:  [90-98] 98 (04/30 2316) Cardiac Rhythm: Atrial fibrillation (04/30 1900) Resp:  [13-20] 20 (05/01 0338) BP: (126-137)/(57-78) 135/78 (05/01 0338) SpO2:  [94 %-100 %] 95 % (05/01 0338) Weight:  [104.6 kg] 104.6 kg (05/01 0537)  Hemodynamic parameters for last 24 hours:    Intake/Output from previous day: 04/30 0701 - 05/01 0700 In: -  Out: 350 [Urine:350] Intake/Output this shift: No intake/output data recorded.  General appearance: alert, cooperative, appears stated age and no distress Heart: regular rate and rhythm Lungs: clear to auscultation bilaterally Abdomen: benign Extremities: no edema Wound: incis healing well, echymosis stable right thigh  Lab Results: Recent Labs    10/17/20 0102  WBC 7.7  HGB 8.9*  HCT 27.7*  PLT 216   BMET:  Recent Labs    10/17/20 0102  NA 136  K 4.1  CL 101  CO2 24  GLUCOSE 94  BUN 25*  CREATININE 1.18  CALCIUM 8.5*    PT/INR: No results for input(s): LABPROT, INR in the last 72 hours. ABG    Component Value Date/Time   PHART 7.312 (L) 10/12/2020 2120   HCO3 23.9 10/12/2020 2120   TCO2 25 10/12/2020 2120   ACIDBASEDEF 3.0 (H) 10/12/2020 2120   O2SAT 99.0 10/12/2020 2120   CBG (last 3)  Recent Labs    10/17/20 1612 10/17/20 2115 10/18/20 0627  GLUCAP 175* 166* 150*    Meds Scheduled Meds: . aspirin EC  325 mg Oral Daily   . atorvastatin  40 mg Oral Daily  . DULoxetine  30 mg Oral Daily  . enoxaparin (LOVENOX) injection  40 mg Subcutaneous QHS  . insulin aspart  0-24 Units Subcutaneous TID AC & HS  . insulin detemir  20 Units Subcutaneous Daily  . mouth rinse  15 mL Mouth Rinse BID  . metFORMIN  1,000 mg Oral BID WC  . pantoprazole  40 mg Oral QAC breakfast  . sodium chloride flush  3 mL Intravenous Q12H   Continuous Infusions: . sodium chloride     PRN Meds:.sodium chloride, acetaminophen, ondansetron **OR** ondansetron (ZOFRAN) IV, oxyCODONE, senna-docusate, sodium chloride flush, traMADol  Xrays DG Chest 2 View  Result Date: 10/17/2020 CLINICAL DATA:  Post CABG EXAM: CHEST - 2 VIEW COMPARISON:  10/14/2020 FINDINGS: Improved aeration at the left lung base. Left IJ cannula has been removed. No pneumothorax. Stable cardiomediastinal contours. Post CABG. Atrial appendage clip. Thoracic spinal stimulator also noted. IMPRESSION: Improved basilar atelectasis.  No new findings. Electronically Signed   By: Macy Mis M.D.   On: 10/17/2020 11:57    Assessment/Plan: S/P Procedure(s) (LRB): CORONARY ARTERY BYPASS GRAFTING (CABG) X TWO USING LEFT INTERNAL MAMMARY ARTERY AND RIGHT ENDOSCOPIC SAPHENOUS VEIN HARVEST CONDUIT (N/A) CLIPPING OF ATRIAL APPENDAGE (N/A) TRANSESOPHAGEAL ECHOCARDIOGRAM (TEE) (N/A) ENDARTERECTOMY CAROTID USING XENOSURE BIOLOGIC PATCH 1CM X 6CM (Right)   1 afeb, VSS,rhythm remains  stable with some low grade tachy with ambulation low 100's) 2 sats good on RA 3 no new CXR/Labs 4 BS decent control, will restart jardiance and decrease insulin further 5 today he tells me he is willinh to go to SNF short term for further rehab- will need TOC/SW to have either [place] arrangements  Or SNF if insurance approves 6 cont rehab modalities while inpatient     LOS: 6 days    John Giovanni PA-C Pager [phone] 10/18/2020   Agree with above. Doing well. Dispo planning.  Eathon Valade Bary Leriche

## 2020-10-18 NOTE — Progress Notes (Signed)
Mobility Specialist - Progress Note   10/18/20 1422  Mobility  Activity Ambulated in hall  Level of Assistance Standby assist, set-up cues, supervision of patient - no hands on  Assistive Device None  Distance Ambulated (ft) 400 ft  Mobility Response Tolerated well  Mobility performed by Mobility specialist  $Mobility charge 1 Mobility   Pre-mobility: 132 HR During mobility: 133 HR Post-mobility: 112 HR  Pt received exiting bathroom. He was asx throughout ambulation. Pt back sitting on edge of bed after walk.   Pricilla Handler Mobility Specialist Mobility Specialist Phone: [phone]

## 2020-10-18 NOTE — TOC Progression Note (Addendum)
Transition of Care [facility]) - Progression Note    Patient Details  Name: Thomas Mckee MRN: [id number] Date of Birth: August 06, 1940  Transition of Care [facility]) CM/SW [place], [place] Phone Number: [phone] 10/18/2020, 1:16 PM  Clinical Narrative:    CSW was alerted that pt was willing to go to a SNF now. CSW reviewed pt's PT recommendation and it is HH/with supervision. CSW reached out to [place] (PT) to ascertain that if pt was a candidate for a SNF. Marguarite Arbour explained that by the note review it did not appear to be however to follow up with Iustin -mobility specialist since he had just seen pt.   Iustin explained that pt was walking 420 ft therefore would not be appropriate for SNF.  CSW met with pt and explained that due to his increased mobility that [facility] was being recommended. Pt explained that he would be hiring someone to assist him. Pt is also requesting a rollartor walker when he returns home. Pt is agreeable to [facility].   TOC team will continue to assist with discharge planning needs.   Expected Discharge Plan: [place] Barriers to Discharge: Continued Medical Work up  Expected Discharge Plan and Services Expected Discharge Plan: [place] In-house Referral: Clinical Social Work     Living arrangements for the past 2 months: Single Family Home                                       Social Determinants of Health (SDOH) Interventions    Readmission Risk Interventions No flowsheet data found.

## 2020-10-18 NOTE — TOC Progression Note (Addendum)
Transition of Care (TOC) - Progression Note  Valentina Gu, BSN Transitions of Care Unit 4E- RN Case Manager See Treatment Team for direct phone #    Patient Details  Name: Thomas Mckee MRN: [id number] Date of Birth: 03-Sep-1940  Transition of Care [facility]) CM/SW Contact  Dahlia Client Romeo Rabon, RN Phone Number: 10/18/2020, 2:49 PM  Clinical Narrative:    Follow up done with pt at bedside for transition of care needs. On entering room pt was returning from bathroom to bed without assistive device and independently.  Noted PT is now recommending [facility] for post discharge follow up and pt is ambulating well at 420 ft. Per CSW note insurance is not likely to approve SNF stay with pt's current mobility level and PT recommendations for [facility] not SNF.   Orders have been placed for HHRN/PT/aide/SW, as well as DME- RW.  Discussion was had with pt regarding HH, DME and plans for discharge.  Explained to pt about Vascular HH referral to [facility]--pre-op- pt voiced that he is still confused about being told that he needs 24/7 care and rehab- and going home. Explained to pt about insurance likely not approving rehab due to his currently level of mobility and function. Pt voiced that he understands that his insurance is likely to deny, he explains that [facility] told him that they would increase his aide hours post surgery from 20hr/week to 40hr/week. He also has spoken with a private duty CNA that he says may be able to come assist during the first week at home. Explained to patient that [facility] would be another layer but they would not be coming daily only a couple days a week for a short visit (for skilled need covered under his insurance) for either RN or PT needs.  Pt is also requesting a rollator for home- which can be arranged for delivery to room prior to discharge.   As per recommendations by PT- pt can be discharged home with [facility] arrangements and private duty assistance that pt can arrange. TOC will f/u  with pt tomorrow.    Expected Discharge Plan: [place] Barriers to Discharge: Continued Medical Work up  Expected Discharge Plan and Services Expected Discharge Plan: [place] In-house Referral: Clinical Social Work Discharge Planning Services: CM Consult Post Acute Care Choice: [place] arrangements for the past 2 months: Single Family Home                 DME Arranged: Walker rolling with seat DME Agency: [employer]       HH Arranged: RN,PT,Nurse's Aide,Social Work [employer] Agency: [facility]         Social Determinants of Health (SDOH) Interventions    Readmission Risk Interventions No flowsheet data found.

## 2020-10-18 NOTE — Progress Notes (Signed)
Mobility Specialist - Progress Note   10/18/20 1044  Mobility  Activity Ambulated in hall  Level of Assistance Standby assist, set-up cues, supervision of patient - no hands on  Assistive Device None  Distance Ambulated (ft) 420 ft  Mobility Response Tolerated well  Mobility performed by Mobility specialist  $Mobility charge 1 Mobility   Pre-mobility: 125 HR During mobility: 133 HR Post-mobility: 110 HR  Pt asx throughout ambulation. Pt sitting on edge of bed after walk.   Pricilla Handler Mobility Specialist Mobility Specialist Phone: [phone]

## 2020-10-19 ENCOUNTER — Telehealth: Payer: Self-pay | Admitting: Student

## 2020-10-19 ENCOUNTER — Inpatient Hospital Stay (INDEPENDENT_AMBULATORY_CARE_PROVIDER_SITE_OTHER): Payer: HMO

## 2020-10-19 DIAGNOSIS — R001 Bradycardia, unspecified: Secondary | ICD-10-CM | POA: Insufficient documentation

## 2020-10-19 HISTORY — DX: Bradycardia, unspecified: R00.1

## 2020-10-19 LAB — GLUCOSE, CAPILLARY
Glucose-Capillary: 173 mg/dL — ABNORMAL HIGH (ref 70–99)
Glucose-Capillary: 220 mg/dL — ABNORMAL HIGH (ref 70–99)
Glucose-Capillary: 130 mg/dL — ABNORMAL HIGH (ref 70–99)

## 2020-10-19 MED ORDER — TRAMADOL HCL 50 MG PO TABS: 50.0000 mg | ORAL_TABLET | Freq: Four times a day (QID) | ORAL | 0 refills | Status: AC | PRN

## 2020-10-19 NOTE — Progress Notes (Addendum)
  Telemetry reviewed.   No bradycardia  Baseline rhythm remains most likely NSR with LBBB and long 1st degree AV block vs accelerated junctional rhythm.  No bradycardia.  Will order 2 week Zio patch.   Follow up in place with Dr. Curt Bears. Would have Dr. Curt Bears read monitor with tentative need for pacing.   [place] for home with monitoring from EP perspective.    Legrand Como [address] [place], PA-C  10/19/2020 8:32 AM

## 2020-10-19 NOTE — Plan of Care (Signed)
  Problem: Education: Goal: Knowledge of General Education information will improve Description: Including pain rating scale, medication(s)/side effects and non-pharmacologic comfort measures Outcome: Adequate for Discharge   Problem: Health Behavior/Discharge Planning: Goal: Ability to manage health-related needs will improve Outcome: Adequate for Discharge   Problem: Clinical Measurements: Goal: Ability to maintain clinical measurements within normal limits will improve Outcome: Adequate for Discharge Goal: Will remain free from infection Outcome: Adequate for Discharge Goal: Diagnostic test results will improve Outcome: Adequate for Discharge Goal: Respiratory complications will improve Outcome: Adequate for Discharge Goal: Cardiovascular complication will be avoided Outcome: Adequate for Discharge   Problem: Nutrition: Goal: Adequate nutrition will be maintained Outcome: Adequate for Discharge   Problem: Activity: Goal: Risk for activity intolerance will decrease Outcome: Adequate for Discharge   Problem: Elimination: Goal: Will not experience complications related to bowel motility Outcome: Adequate for Discharge Goal: Will not experience complications related to urinary retention Outcome: Adequate for Discharge   Problem: Pain Managment: Goal: General experience of comfort will improve Outcome: Adequate for Discharge   Problem: Respiratory: Goal: Respiratory status will improve Outcome: Adequate for Discharge   Problem: Skin Integrity: Goal: Wound healing without signs and symptoms of infection Outcome: Adequate for Discharge Goal: Risk for impaired skin integrity will decrease Outcome: Adequate for Discharge   Problem: Activity: Goal: Risk for activity intolerance will decrease Outcome: Adequate for Discharge   Problem: Cardiac: Goal: Will achieve and/or maintain hemodynamic stability Outcome: Adequate for Discharge   Problem: Urinary  Elimination: Goal: Ability to achieve and maintain adequate renal perfusion and functioning will improve Outcome: Adequate for Discharge   Problem: Skin Integrity: Goal: Wound healing without signs and symptoms of infection Outcome: Adequate for Discharge Goal: Risk for impaired skin integrity will decrease Outcome: Adequate for Discharge

## 2020-10-19 NOTE — Progress Notes (Signed)
Zio patch placed onto patient.  All instructions and information reviewed with patient, they verbalize understanding with no questions. 

## 2020-10-19 NOTE — Progress Notes (Signed)
Discharge summary reviewed with patient.  Medication changes reviewed with patient. CCMD notified.  IV access discontinued.  Assisted by staff to private vehicle.

## 2020-10-19 NOTE — Progress Notes (Signed)
      [address]       [place] [address]             [phone]      7 Days Post-Op Procedure(s) (LRB): CORONARY ARTERY BYPASS GRAFTING (CABG) X TWO USING LEFT INTERNAL MAMMARY ARTERY AND RIGHT ENDOSCOPIC SAPHENOUS VEIN HARVEST CONDUIT (N/A) CLIPPING OF ATRIAL APPENDAGE (N/A) TRANSESOPHAGEAL ECHOCARDIOGRAM (TEE) (N/A) ENDARTERECTOMY CAROTID USING XENOSURE BIOLOGIC PATCH 1CM X 6CM (Right) Subjective: Awake and alert and says he feels good. No new concerns, doing well with mobility and transfers. He would like to return home.   Objective: Vital signs in last 24 hours: Temp:  [97.9 F (36.6 C)-98.3 F (36.8 C)] 98.1 F (36.7 C) (05/02 0352) Pulse Rate:  [96-101] 99 (05/01 1713) Cardiac Rhythm: Junctional rhythm (05/01 1901) Resp:  [16-20] 20 (05/02 0352) BP: (101-129)/(54-78) 101/54 (05/02 0352) SpO2:  [94 %-100 %] 94 % (05/02 0352) Weight:  [105.6 kg] 105.6 kg (05/02 0615)     Intake/Output from previous day: 05/01 0701 - 05/02 0700 In: 480 [P.O.:480] Out: 300 [Urine:300] Intake/Output this shift: No intake/output data recorded.  General appearance: alert, cooperative and no distress Neurologic: intact Heart: Stable rhythm over the weekend, accelerated JR Vs SR with long 1st degree AVB and BBB. Has rates in the 130's with activity. No episodes of bradycardia recorded.  Lungs: Breath sounds are clear. On RA with good sats.  Abdomen: Soft and non-tender.  Extremities: No LE edema, expected bruising right thigh. RLE EVH incision is intact and dry.  Wound: Sternotomy incision is intact and dry.   Lab Results: Recent Labs    10/17/20 0102  WBC 7.7  HGB 8.9*  HCT 27.7*  PLT 216   BMET:  Recent Labs    10/17/20 0102  NA 136  K 4.1  CL 101  CO2 24  GLUCOSE 94  BUN 25*  CREATININE 1.18  CALCIUM 8.5*    PT/INR:  No results for input(s): LABPROT, INR in the last 72 hours. ABG    Component Value Date/Time   PHART 7.312 (L) 10/12/2020  2120   HCO3 23.9 10/12/2020 2120   TCO2 25 10/12/2020 2120   ACIDBASEDEF 3.0 (H) 10/12/2020 2120   O2SAT 99.0 10/12/2020 2120   CBG (last 3)  Recent Labs    10/18/20 1704 10/18/20 2012 10/19/20 0619  GLUCAP 164* 203* 173*    Assessment/Plan: S/P Procedure(s) (LRB): CORONARY ARTERY BYPASS GRAFTING (CABG) X TWO USING LEFT INTERNAL MAMMARY ARTERY AND RIGHT ENDOSCOPIC SAPHENOUS VEIN HARVEST CONDUIT (N/A) CLIPPING OF ATRIAL APPENDAGE (N/A) TRANSESOPHAGEAL ECHOCARDIOGRAM (TEE) (N/A) ENDARTERECTOMY CAROTID USING XENOSURE BIOLOGIC PATCH 1CM X 6CM (Right)  -POD7 CABG / right CEA. Stable BP.  Continue ASA, atorvastatin.   Making slow progress with mobility.   -Bradycardia / heart block early post-op- resolved. Stable rhythm over the weekend. EP recommended 2-week monitor at discharge.   -Volume excess- resolved.  -Expected acute blood loss anemia- no new lab today, Hct 27.7 yesterday.  -Type 2 DM- controlled with Levemir and SSI, Glucose 150-200.  -Disposition- plan discharge to home today (after seen by cardiology EP)  with home health RN and PT as well as home health aide. We have arranged a rolling walker but he says he is not requiring this now.    LOS: 7 days    Antony Odea, [place] [phone] 10/19/2020

## 2020-10-19 NOTE — Progress Notes (Signed)
Mobility Specialist: Progress Note   10/19/20 1250  Mobility  Activity Ambulated in hall  Level of Assistance Contact guard assist, steadying assist  Assistive Device None  Distance Ambulated (ft) 240 ft  Mobility Response Tolerated well  Mobility performed by Mobility specialist  $Mobility charge 1 Mobility   Pre-Mobility: 104 HR Post-Mobility: 139 HR, 90% SpO2  Pt c/o his knees feeling weak during ambulation he says is due to doing stairs with PT earlier today. Pt otherwise asx. Pt back to bed after walk with call bell at his side.  [facility] Clevie Prout Mobility Specialist Mobility Specialist Phone: [phone]

## 2020-10-19 NOTE — Progress Notes (Signed)
CARDIAC REHAB PHASE I   PRE:  Rate/Rhythm: 102 ?ST 1HB  BP:  Supine: 138/71  Sitting: 109/89  Standing:    SaO2: 98%RA  MODE:  Ambulation: 400 ft   POST:  Rate/Rhythm: 136 ?JT   101 after rest  BP:  Supine:   Sitting: 138/81  Standing:    SaO2: 98%RA [id number] Reinforced sternal precautions as pt observed pushing with arms. Pt stated he has been instructed on staying in the tube. Encouraged IS and walking at home. Pt walked 400 ft on RA with hand held asst. HR elevated to 136. ?JT. Notified pt's RN of elevated HR with activity. Pt has ZIO patch on. He did not c/o palpitations and tolerated walk well. Just a little tired. HR back to 101 resting in bed.    Graylon Good, RN BSN  10/19/2020 9:47 AM

## 2020-10-19 NOTE — Telephone Encounter (Signed)
Spoke with Thomas Mckee from [employer]  Who is reporting a critical EKG on a ZIO -AT monitor today at 157pm.  Monitor shows Aflutter with rate of 101 for 90 seconds, auto triggered.  Thomas Mckee states she called the pt who reports he pulled his groin during that time.  Pt is currently admitted to the hospital post CABG on 10/12/2020.  Thomas Mckee states she will fax report.

## 2020-10-19 NOTE — Care Management Important Message (Signed)
Important Message  Patient Details  Name: Jeramine Delis MRN: [id number] Date of Birth: 06-15-1941   Medicare Important Message Given:  Yes     Shelda Altes 10/19/2020, 11:14 AM

## 2020-10-19 NOTE — Progress Notes (Signed)
Physical Therapy Treatment Patient Details Name: Thomas Mckee MRN: [id number] DOB: 1940/07/24 Today's Date: 10/19/2020    History of Present Illness pt is a 80 y/o male presenting 4/25 with right symptomatic carotid stenosis >80% with recent TIA event, scheduled for a CABG.  Same day, pt s/p R carotid endarterectomy , CABG x3 and atrial appendage clipping.  PMHx: CVA, DDD, Lumbar radiculopathy with lami and cord stimulator, R TKA, CKD3    PT Comments    Pt doing well working toward goals.  Close to meet all, but needs further reinforcement on sternal precautions.  Emphasis on transitions, sti to stands with cuing, progression of gait stability with/without AD and stair training.   Follow Up Recommendations  Home health PT;Supervision - Intermittent (work toward CRP2 if when recommended)     Equipment Recommendations  None recommended by PT    Recommendations for Other Services       Precautions / Restrictions Precautions Precautions: Sternal;Fall    Mobility  Bed Mobility Overal bed mobility: Needs Assistance     Sidelying to sit: Supervision   Sit to supine: Supervision   General bed mobility comments: up following precautions, building momentum well, no assist.    Transfers Overall transfer level: Needs assistance Equipment used: Rolling walker (2 wheeled) Transfers: Sit to/from Stand Sit to Stand: Supervision;Modified independent (Device/Increase time)         General transfer comment: pt is getting up around the room independently, but needing reinforcement on hand in front over/on knees during ascent/descent.  Ambulation/Gait Ambulation/Gait assistance: Supervision Gait Distance (Feet): 280 Feet (with 1 standing rest break.140' after sitting rest.) Assistive device: None;4-wheeled walker (the majority without AD) Gait Pattern/deviations: Step-through pattern   Gait velocity interpretation: <1.8 ft/sec, indicate of risk for recurrent falls General Gait  Details: steady overall, slower, accepting some balance challenge without overt deviation and no LOB.  Pt reports tired, but no noticeable dyspnea.   Stairs Stairs: Yes   Stair Management: One rail Left;Step to pattern;Forwards Number of Stairs: 4 General stair comments: safe with the rail   Wheelchair Mobility    Modified Rankin (Stroke Patients Only)       Balance Overall balance assessment: Needs assistance Sitting-balance support: No upper extremity supported;Feet supported Sitting balance-Leahy Scale: Good     Standing balance support: No upper extremity supported Standing balance-Leahy Scale: Fair Standing balance comment: statically and dynamically steady                            Cognition Arousal/Alertness: Awake/alert Behavior During Therapy: WFL for tasks assessed/performed Overall Cognitive Status: Within Functional Limits for tasks assessed                                 General Comments: continues to need some cuing for sternal precautions at times      Exercises      General Comments General comments (skin integrity, edema, etc.): needs more sternal precaution reinforcement      Pertinent Vitals/Pain Pain Assessment: Faces Faces Pain Scale: Hurts little more Pain Location: chest Pain Descriptors / Indicators: Discomfort;Grimacing Pain Intervention(s): Monitored during session    Home Living                      Prior Function            PT Goals (current goals can now be found  in the care plan section) Acute Rehab PT Goals Patient Stated Goal: return home and recover PT Goal Formulation: With patient Time For Goal Achievement: 10/28/20 Potential to Achieve Goals: Good Progress towards PT goals: Progressing toward goals    Frequency    Min 3X/week      PT Plan Discharge plan needs to be updated    Co-evaluation              AM-PAC PT "6 Clicks" Mobility   Outcome Measure  Help  needed turning from your back to your side while in a flat bed without using bedrails?: None Help needed moving from lying on your back to sitting on the side of a flat bed without using bedrails?: None Help needed moving to and from a bed to a chair (including a wheelchair)?: None Help needed standing up from a chair using your arms (e.g., wheelchair or bedside chair)?: None Help needed to walk in hospital room?: A Little Help needed climbing 3-5 steps with a railing? : A Little 6 Click Score: 22    End of Session   Activity Tolerance: Patient tolerated treatment well Patient left: in bed;with bed alarm set Nurse Communication: Mobility status PT Visit Diagnosis: Other abnormalities of gait and mobility (R26.89);Muscle weakness (generalized) (M62.81);Difficulty in walking, not elsewhere classified (R26.2) Pain - part of body:  (sternal)     Time: 1041-1110 PT Time Calculation (min) (ACUTE ONLY): 29 min  Charges:  $Gait Training: 8-22 mins $Therapeutic Activity: 8-22 mins                     10/19/2020  Ginger Carne., PT Acute Rehabilitation Services [phone]  (pager) [phone]  (office)   Tessie Fass Camillo Quadros 10/19/2020, 2:18 PM

## 2020-10-19 NOTE — Telephone Encounter (Signed)
Calling to report critical EKG

## 2020-10-19 NOTE — Progress Notes (Signed)
Occupational Therapy Treatment Patient Details Name: Thomas Mckee MRN: [id number] DOB: 04-24-1941 Today's Date: 10/19/2020    History of present illness pt is a 80 y/o male presenting 4/25 with right symptomatic carotid stenosis >80% with recent TIA event, scheduled for a CABG.  Same day, pt s/p R carotid endarterectomy , CABG x3 and atrial appendage clipping.  PMHx: CVA, DDD, Lumbar radiculopathy with lami and cord stimulator, R TKA, CKD3   OT comments  Focus of session on education regarding ADL management while adhering to sternal precautions. Pt able to states "I need to stay in the tube", however requires mod VC to actually stay in the tube. Pt states his PCA will help daily. Recommend his PCA assist initially with bathing/dressing after DC if possible. Also recommend pt follow up with HHOT to maximize functional level of independence and reduce risk of falls.   Follow Up Recommendations  Home health OT;Supervision - Intermittent    Equipment Recommendations  None recommended by OT    Recommendations for Other Services      Precautions / Restrictions Precautions Precautions: Sternal;Fall       Mobility Bed Mobility Overal bed mobility: Needs Assistance     Sidelying to sit: Supervision            Transfers Overall transfer level: Needs assistance Equipment used: Rolling walker (2 wheeled)   Sit to Stand: Supervision         General transfer comment: Verbal cues for hands to knees for transfer    Balance Overall balance assessment: Needs assistance   Sitting balance-Leahy Scale: Good       Standing balance-Leahy Scale: Fair                             ADL either performed or assessed with clinical judgement   ADL Overall ADL's : Needs assistance/impaired     Grooming: Supervision/safety;Standing   Upper Body Bathing: Supervision/ safety;Sitting   Lower Body Bathing: Supervison/ safety;Sit to/from stand   Upper Body Dressing :  Supervision/safety;Set up;Sitting   Lower Body Dressing: Min guard;Sit to/from stand   Toilet Transfer: Supervision/safety;Ambulation   Toileting- Clothing Manipulation and Hygiene: Supervision/safety Toileting - Clothing Manipulation Details (indicate cue type and reason): for pericare; VC to "stay in the tube"     Functional mobility during ADLs: Supervision/safety General ADL Comments: Able to verbalize "stay in the tube", however requires mod VC to stay in the tube     Vision       Perception     Praxis      Cognition Arousal/Alertness: Awake/alert Behavior During Therapy: WFL for tasks assessed/performed Overall Cognitive Status: Within Functional Limits for tasks assessed                                 General Comments: mod VC for sternal precautions        Exercises     Shoulder Instructions       General Comments      Pertinent Vitals/ Pain       Pain Assessment: Faces Faces Pain Scale: Hurts little more Pain Location: chest (R side neck) Pain Descriptors / Indicators: Discomfort;Grimacing Pain Intervention(s): Limited activity within patient's tolerance  Home Living  Prior Functioning/Environment              Frequency  Min 2X/week        Progress Toward Goals  OT Goals(current goals can now be found in the care plan section)  Progress towards OT goals: Progressing toward goals  Acute Rehab OT Goals Patient Stated Goal: return home and recover OT Goal Formulation: With patient Time For Goal Achievement: 10/29/20 ADL Goals Pt Will Perform Grooming: with modified independence;standing;sitting Pt Will Perform Lower Body Bathing: with modified independence;sit to/from stand Pt Will Perform Lower Body Dressing: with modified independence;sit to/from stand Pt Will Transfer to Toilet: regular height toilet;ambulating;with modified independence Pt Will Perform  Toileting - Clothing Manipulation and hygiene: with modified independence;sit to/from stand  Plan Discharge plan needs to be updated;Frequency remains appropriate    Co-evaluation                 AM-PAC OT "6 Clicks" Daily Activity     Outcome Measure   Help from another person eating meals?: None Help from another person taking care of personal grooming?: None Help from another person toileting, which includes using toliet, bedpan, or urinal?: A Little Help from another person bathing (including washing, rinsing, drying)?: A Little Help from another person to put on and taking off regular upper body clothing?: A Little Help from another person to put on and taking off regular lower body clothing?: A Little 6 Click Score: 20    End of Session    OT Visit Diagnosis: Unsteadiness on feet (R26.81);Muscle weakness (generalized) (M62.81);Pain Pain - part of body:  (chest)   Activity Tolerance Patient tolerated treatment well   Patient Left in bed;with call bell/phone within reach   Nurse Communication Mobility status;Other (comment) (DC needs)        Time: 1140-1200 OT Time Calculation (min): 20 min  Charges: OT General Charges $OT Visit: 1 Visit OT Treatments $Self Care/Home Management : 8-22 mins     [place] Rollyson,HILLARY 10/19/2020, 12:16 PM Maurie Boettcher, OT/L   Acute OT Clinical Specialist Acute Rehabilitation Services Pager [phone] Office [phone]

## 2020-10-19 NOTE — TOC Transition Note (Signed)
Transition of Care (TOC) - CM/SW Discharge Note Marvetta Gibbons RN, BSN Transitions of Care Unit 4E- RN Case Manager See Treatment Team for direct phone #    Patient Details  Name: Thomas Mckee MRN: [id number] Date of Birth: 01-23-1941  [facility] CM/SW Contact:  Dawayne Patricia, RN Phone Number: 10/19/2020, 12:24 PM   Clinical Narrative:    Pt stable for transition home today, per PT pt has decided that he does not need RW or rollator for home at this time, will not have DME at discharge.   Pt has been set up with [facility] per pre-op referral by Vascular- per [facility] they spoke with pt by phone already and will start services post discharge within 48 hr. Notified Lattie Haw with [facility] of transition home today. [facility] does not have aides at this time- so aide removed from order- Per pt he has aide at home with [facility] for 20hr/week and will try to have increased hours post discharge for 40 hr/week- pt to call [facility] regarding this.  [facility] will also assess for OT needs once in the home as OT staffing is not available until next week.   Per pt he will have transport later today.   Final next level of care: [place] Barriers to Discharge: Barriers Resolved   Patient Goals and CMS Choice Patient states their goals for this hospitalization and ongoing recovery are:: to go home with hh CMS Medicare.gov Compare Post Acute Care list provided to:: Patient Choice offered to / list presented to : Patient  Discharge Placement               Home with [facility]        Discharge Plan and Services In-house Referral: Clinical Social Work Discharge Planning Services: CM Consult Post Acute Care Choice: Home Health,Durable Medical Equipment          DME Arranged: Patient refused services DME Agency: NA       HH Arranged: RN,PT,Nurse's Aide,Social Work [place] Date [place]: 10/19/20 Time [place]:  1223 Representative spoke with at [place]: [place] ([place]) Interventions     Readmission Risk Interventions Readmission Risk Prevention Plan 10/19/2020  Transportation Screening Complete  PCP or Specialist Appt within 5-7 Days Complete  Home Care Screening Complete  Medication Review (RN CM) Complete

## 2020-10-20 ENCOUNTER — Other Ambulatory Visit: Payer: Self-pay

## 2020-10-20 ENCOUNTER — Telehealth: Payer: Self-pay | Admitting: Cardiology

## 2020-10-20 DIAGNOSIS — I25810 Atherosclerosis of coronary artery bypass graft(s) without angina pectoris: Secondary | ICD-10-CM | POA: Diagnosis not present

## 2020-10-20 DIAGNOSIS — I5022 Chronic systolic (congestive) heart failure: Secondary | ICD-10-CM | POA: Diagnosis not present

## 2020-10-20 DIAGNOSIS — I110 Hypertensive heart disease with heart failure: Secondary | ICD-10-CM | POA: Diagnosis not present

## 2020-10-20 DIAGNOSIS — Z48812 Encounter for surgical aftercare following surgery on the circulatory system: Secondary | ICD-10-CM | POA: Diagnosis not present

## 2020-10-20 DIAGNOSIS — I672 Cerebral atherosclerosis: Secondary | ICD-10-CM | POA: Diagnosis not present

## 2020-10-20 DIAGNOSIS — I4819 Other persistent atrial fibrillation: Secondary | ICD-10-CM | POA: Diagnosis not present

## 2020-10-20 DIAGNOSIS — I255 Ischemic cardiomyopathy: Secondary | ICD-10-CM | POA: Diagnosis not present

## 2020-10-20 DIAGNOSIS — E782 Mixed hyperlipidemia: Secondary | ICD-10-CM | POA: Diagnosis not present

## 2020-10-20 DIAGNOSIS — R001 Bradycardia, unspecified: Secondary | ICD-10-CM | POA: Diagnosis not present

## 2020-10-20 DIAGNOSIS — M5136 Other intervertebral disc degeneration, lumbar region: Secondary | ICD-10-CM | POA: Diagnosis not present

## 2020-10-20 DIAGNOSIS — E119 Type 2 diabetes mellitus without complications: Secondary | ICD-10-CM | POA: Diagnosis not present

## 2020-10-20 MED ORDER — CLOPIDOGREL BISULFATE 75 MG PO TABS: 75.0000 mg | ORAL_TABLET | Freq: Every day | ORAL | 3 refills | Status: DC

## 2020-10-20 NOTE — Telephone Encounter (Signed)
Will follow rate and burden for now on monitor as he is post CABG and was active at the time of episode.   Zio was applied for his post op bradycardia.   Legrand Como [address]" [place], PA-C  10/20/2020 3:45 PM

## 2020-10-20 NOTE — Telephone Encounter (Signed)
Needing clarification on patient medication on what he needs to be taking and what he doesn't need to take.

## 2020-10-20 NOTE — Telephone Encounter (Signed)
Spoke to the home health nurse just now and went over the patients medication list for her. She verbalizes understanding and thanked me for calling her back.

## 2020-10-23 ENCOUNTER — Telehealth (HOSPITAL_COMMUNITY): Payer: Self-pay

## 2020-10-23 DIAGNOSIS — C801 Malignant (primary) neoplasm, unspecified: Secondary | ICD-10-CM | POA: Insufficient documentation

## 2020-10-23 DIAGNOSIS — I639 Cerebral infarction, unspecified: Secondary | ICD-10-CM | POA: Insufficient documentation

## 2020-10-23 NOTE — Telephone Encounter (Signed)
Cardiac rehab referral for Ph.II faxed to [place]. 

## 2020-10-26 ENCOUNTER — Encounter: Payer: Self-pay | Admitting: Cardiology

## 2020-10-26 ENCOUNTER — Other Ambulatory Visit: Payer: Self-pay

## 2020-10-26 ENCOUNTER — Ambulatory Visit (INDEPENDENT_AMBULATORY_CARE_PROVIDER_SITE_OTHER): Payer: HMO | Admitting: Cardiology

## 2020-10-26 VITALS — BP 118/70 | HR 88 | Ht 73.0 in | Wt 236.0 lb

## 2020-10-26 DIAGNOSIS — I255 Ischemic cardiomyopathy: Secondary | ICD-10-CM | POA: Diagnosis not present

## 2020-10-26 DIAGNOSIS — I2510 Atherosclerotic heart disease of native coronary artery without angina pectoris: Secondary | ICD-10-CM | POA: Diagnosis not present

## 2020-10-26 DIAGNOSIS — I480 Paroxysmal atrial fibrillation: Secondary | ICD-10-CM

## 2020-10-26 DIAGNOSIS — E782 Mixed hyperlipidemia: Secondary | ICD-10-CM

## 2020-10-26 DIAGNOSIS — R001 Bradycardia, unspecified: Secondary | ICD-10-CM | POA: Diagnosis not present

## 2020-10-26 DIAGNOSIS — H9201 Otalgia, right ear: Secondary | ICD-10-CM | POA: Diagnosis not present

## 2020-10-26 DIAGNOSIS — Z951 Presence of aortocoronary bypass graft: Secondary | ICD-10-CM | POA: Diagnosis not present

## 2020-10-26 HISTORY — DX: Bradycardia, unspecified: R00.1

## 2020-10-26 HISTORY — DX: Atherosclerotic heart disease of native coronary artery without angina pectoris: I25.10

## 2020-10-26 HISTORY — DX: Ischemic cardiomyopathy: I25.5

## 2020-10-26 HISTORY — DX: Paroxysmal atrial fibrillation: I48.0

## 2020-10-26 NOTE — Progress Notes (Signed)
Cardiology Office Note:    Date:  10/26/2020   ID:  Thomas Mckee, DOB [birth year], MRN [id number]  PCP:  Maryella Shivers, MD  Cardiologist:  Berniece Salines, DO  Electrophysiologist:  None   Referring MD: Maryella Shivers, MD   I am doing fine.  History of Present Illness:    Thomas Mckee is a 80 y.o. male with a hx of coronary artery disease status post CABG x3 with LIMA to LAD, SVG to diagonal, SVG to PL branch of the RCA, atrial fibrillation status post clipping of the left atrial appendage, depressed ejection fraction most recent EF is 35 to 40%, diabetes mellitus, history of CVA, hyperlipidemia.  The patient is status post elective coronary artery bypass surgery on October 12, 2020.  His procedure was completed without complications.  Post procedure he had significant sinus bradycardia and a temporary pacing was required.  His beta-blocker was held during that time.  And will place a ZIO monitor on the patient.   He is here today for follow-up visit.  He has been doing well from a cardiovascular standpoint.  He is back to doing most of his activity.  The only limitation now is the fact that he cannot mow his lawn.  Past Medical History:  Diagnosis Date  . Abnormal echocardiogram 08/13/2020  . Abnormal ventricular wall motion 08/13/2020  . Atrial fibrillation ([place]) 08/13/2020  . Cancer [facility])    Skin Cancer on legs per patient 2021  . Carotid stenosis   . Cerebral arteriosclerosis with history of previous cerebrovascular accident   . Chronic kidney disease, stage 3 ([place])   . DDD (degenerative disc disease), lumbar 02/28/2019   Last Assessment & Plan:  Formatting of this note might be different from the original. See status post lumbar laminectomy plan  . HFrEF (heart failure with reduced ejection fraction) ([place]) 08/13/2020  . History of CVA (cerebrovascular accident) 08/13/2020  . LBBB (left bundle branch block) 08/13/2020  . Left ventricular dysfunction   . Lumbar radiculopathy  02/28/2019   Last Assessment & Plan:  Formatting of this note might be different from the original. Continue 800 mg gabapentin t.i.d..  Unable to obtain Lyrica due to cost  . Mixed hyperlipidemia 08/13/2020  . Neck pain 02/28/2019   Last Assessment & Plan:  Formatting of this note might be different from the original. Chronic neck pain with radiation to the bilateral shoulders, left greater than right.  He reports having plain films completed by chiropractor do not have access to these.  Will consider updating imaging for possible procedural consideration of persistent symptoms.  . Other chronic pain 02/28/2019  . Pain management contract agreement 06/27/2019   Last Assessment & Plan:  Formatting of this note might be different from the original. Prior UDS results are consistent with regimen, [place] controlled substance registry is checked and is also consistent.  . Primary hypertension 08/13/2020  . S/P insertion of spinal cord stimulator 02/28/2019   Last Assessment & Plan:  Formatting of this note might be different from the original. He has established with [employer] rep who is helping him to make adjustments to his programming accordingly.  He needs to follow-up with them as directed  . Status post lumbar laminectomy 02/28/2019   Last Assessment & Plan:  Formatting of this note is different from the original. 80 year old male with chronic low back and bilateral lower extremity radicular symptoms status post lumbar laminectomy with subsequent spinal cord stimulator placement. Recently updated plain films of the thoracic and  lumbar spine show moderate lower lumbar facet arthrosis with indwellingThoracic stimulatorleadsce  . Stroke [facility])    Mini stroke per patient 08/31/2020  . Systolic CHF ([place])   . Type 2 diabetes mellitus with complication, with long-term current use of insulin ([place]) 08/13/2020    Past Surgical History:  Procedure Laterality Date  . BACK SURGERY    . CLIPPING OF ATRIAL  APPENDAGE N/A 10/12/2020   Procedure: CLIPPING OF ATRIAL APPENDAGE;  Surgeon: Gaye Pollack, MD;  Location: [place];  Service: Open Heart Surgery;  Laterality: N/A;  . CORONARY ARTERY BYPASS GRAFT N/A 10/12/2020   Procedure: CORONARY ARTERY BYPASS GRAFTING (CABG) X TWO USING LEFT INTERNAL MAMMARY ARTERY AND RIGHT ENDOSCOPIC SAPHENOUS VEIN HARVEST CONDUIT;  Surgeon: Gaye Pollack, MD;  Location: [place];  Service: Open Heart Surgery;  Laterality: N/A;  . ENDARTERECTOMY Right 10/12/2020   Procedure: ENDARTERECTOMY CAROTID USING XENOSURE BIOLOGIC PATCH 1CM X 6CM;  Surgeon: [name], MD;  Location: [place];  Service: Vascular;  Laterality: Right;  . LEFT HEART CATH AND CORONARY ANGIOGRAPHY N/A 08/18/2020   Procedure: LEFT HEART CATH AND CORONARY ANGIOGRAPHY;  Surgeon: Jettie Booze, MD;  Location: [place] CV LAB;  Service: Cardiovascular;  Laterality: N/A;  . REPLACEMENT TOTAL KNEE Right   . SHOULDER SURGERY    . TEE WITHOUT CARDIOVERSION N/A 10/12/2020   Procedure: TRANSESOPHAGEAL ECHOCARDIOGRAM (TEE);  Surgeon: Gaye Pollack, MD;  Location: [place];  Service: Open Heart Surgery;  Laterality: N/A;    Current Medications: Current Meds  Medication Sig  . aspirin EC 81 MG tablet Take 81 mg by mouth daily. Swallow whole.  . DULoxetine (CYMBALTA) 30 MG capsule Take 30 mg by mouth daily.  . empagliflozin (JARDIANCE) 25 MG TABS tablet Take 25 mg by mouth daily.  Marland Kitchen gabapentin (NEURONTIN) 800 MG tablet Take 800 mg by mouth 3 (three) times daily.  Marland Kitchen LANTUS SOLOSTAR 100 UNIT/ML Solostar Pen Inject 44 Units into the skin at bedtime.  . metFORMIN (GLUCOPHAGE) 1000 MG tablet Take 1,000 mg by mouth 2 (two) times daily with a meal.  . oxymetazoline (NASAL SPRAY 12 HOUR) 0.05 % nasal spray Place 1 spray into both nostrils daily as needed for congestion.  Marland Kitchen OZEMPIC, 0.25 OR 0.5 MG/DOSE, 2 MG/1.5ML SOPN Inject 1 mg into the skin once a week.  . simvastatin (ZOCOR) 80 MG tablet Take 80 mg by mouth  at bedtime.     Allergies:   Morphine   Social History   Socioeconomic History  . Marital status: Divorced    Spouse name: Not on file  . Number of children: Not on file  . Years of education: Not on file  . Highest education level: Not on file  Occupational History  . Not on file  Tobacco Use  . Smoking status: Former Smoker    Quit date: 1979    Years since quitting: 43.3  . Smokeless tobacco: Never Used  Vaping Use  . Vaping Use: Never used  Substance and Sexual Activity  . Alcohol use: Not on file  . Drug use: Never  . Sexual activity: Not Currently  Other Topics Concern  . Not on file  Social History Narrative  . Not on file   Social Determinants of Health   Financial Resource Strain: Not on file  Food Insecurity: Not on file  Transportation Needs: Not on file  Physical Activity: Not on file  Stress: Not on file  Social Connections: Not on file  Family History: The patient's family history includes Cancer in his brother; Diabetes in his father and sister.  ROS:   Review of Systems  Constitution: Negative for decreased appetite, fever and weight gain.  HENT: Negative for congestion, ear discharge, hoarse voice and sore throat.   Eyes: Negative for discharge, redness, vision loss in right eye and visual halos.  Cardiovascular: Negative for chest pain, dyspnea on exertion, leg swelling, orthopnea and palpitations.  Respiratory: Negative for cough, hemoptysis, shortness of breath and snoring.   Endocrine: Negative for heat intolerance and polyphagia.  Hematologic/Lymphatic: Negative for bleeding problem. Does not bruise/bleed easily.  Skin: Negative for flushing, nail changes, rash and suspicious lesions.  Musculoskeletal: Negative for arthritis, joint pain, muscle cramps, myalgias, neck pain and stiffness.  Gastrointestinal: Negative for abdominal pain, bowel incontinence, diarrhea and excessive appetite.  Genitourinary: Negative for decreased libido,  genital sores and incomplete emptying.  Neurological: Negative for brief paralysis, focal weakness, headaches and loss of balance.  Psychiatric/Behavioral: Negative for altered mental status, depression and suicidal ideas.  Allergic/Immunologic: Negative for HIV exposure and persistent infections.    EKGs/Labs/Other Studies Reviewed:    The following studies were reviewed today:   EKG: None today  Transthoracic echocardiogram October 02, 2020 IMPRESSIONS  1. Left ventricular ejection fraction, by estimation, is 35 to 40%. The  left ventricle has moderately decreased function. The left ventricle  demonstrates regional wall motion abnormalities (see scoring  diagram/findings for description). Left ventricular  diastolic function could not be evaluated.  2. Right ventricular systolic function is normal. The right ventricular  size is normal. Tricuspid regurgitation signal is inadequate for assessing  PA pressure.  3. Left atrial size was mildly dilated.  4. The mitral valve is normal in structure. Trivial mitral valve  regurgitation. No evidence of mitral stenosis. Severe mitral annular  calcification.  5. The aortic valve is tricuspid. There is moderate calcification of the  aortic valve. There is mild thickening of the aortic valve. Aortic valve  regurgitation is not visualized. Mild to moderate aortic valve  sclerosis/calcification is present, without  any evidence of aortic stenosis.  6. The inferior vena cava is normal in size with greater than 50%  respiratory variability, suggesting right atrial pressure of 3 mmHg.   Recent Labs: 10/08/2020: ALT 30 10/13/2020: Magnesium 2.2 10/17/2020: BUN 25; Creatinine, Ser 1.18; Hemoglobin 8.9; Platelets 216; Potassium 4.1; Sodium 136  Recent Lipid Panel No results found for: CHOL, TRIG, HDL, CHOLHDL, VLDL, LDLCALC, LDLDIRECT  Physical Exam:    VS:  BP 118/70   Pulse 88   Ht 6\' 1"  (1.854 m)   Wt 236 lb (107 kg)   SpO2 94%    BMI 31.14 kg/m     Wt Readings from Last 3 Encounters:  10/26/20 236 lb (107 kg)  10/19/20 232 lb 12.9 oz (105.6 kg)  10/08/20 233 lb 14.4 oz (106.1 kg)     GEN: Well nourished, well developed in no acute distress HEENT: Normal NECK: No JVD; No carotid bruits LYMPHATICS: No lymphadenopathy CARDIAC: S1S2 noted,RRR, no murmurs, rubs, gallops RESPIRATORY:  Clear to auscultation without rales, wheezing or rhonchi  ABDOMEN: Soft, non-tender, non-distended, +bowel sounds, no guarding. EXTREMITIES: No edema, No cyanosis, no clubbing MUSCULOSKELETAL:  No deformity  SKIN: Warm and dry NEUROLOGIC:  Alert and oriented x 3, non-focal PSYCHIATRIC:  Normal affect, good insight  ASSESSMENT:    1. Coronary artery disease involving native coronary artery of native heart, unspecified whether angina present   2. Mixed hyperlipidemia  3. Paroxysmal atrial fibrillation (HCC)   4. Sinus bradycardia   5. Ischemic cardiomyopathy    PLAN:     No angina symptoms.  I were able to take a look at his suture site which look clean and dry.  No signs of infection.  For now we will keep the patient on his aspirin as well as his simvastatin.  He needs cardiac rehab but he is very hesitant right now but is willing to go for his initial consultation.  We will refer the patient.  In terms of his ischemic cardiomyopathy his blood pressure is on the lower side and I am unable to start [facility] on this patient for now.  He is on Jardiance we will keep this medicine going.  He cannot tolerate beta-blocker due to his sinus bradycardia recently.  Hopefully once his blood pressure can tolerate it I plan to start Entresto as well as Aldactone.  His goal LDL is less than 70 ideally less than 55 he is 60 on blood work which was done in February 2020.  We will repeat this in the next 6 weeks.  He had episodes of sinus bradycardia requiring temporary pacing while he was in the hospital.  He has paroxysmal atrial  fibrillation.  And is status post left atrial clipping.  He is off anticoagulation.  Once he is able to send back to ZIO monitor we will review his rhythm and assess for any significant heart rate excursions.  Hyperlipidemia - continue with current statin medication.  This is being managed by his primary care doctor.  No adjustments for antidiabetic medications were made today.  The patient understands the need to lose weight with diet and exercise. We have discussed specific strategies for this.  The patient is in agreement with the above plan. The patient left the office in stable condition.  The patient will follow up in 3 months or sooner if needed.  Medication Adjustments/Labs and Tests Ordered: Current medicines are reviewed at length with the patient today.  Concerns regarding medicines are outlined above.  Orders Placed This Encounter  Procedures  . Basic metabolic panel  . Magnesium  . AMB referral to cardiac rehabilitation   No orders of the defined types were placed in this encounter.   Patient Instructions  Medication Instructions:  Your physician recommends that you continue on your current medications as directed. Please refer to the Current Medication list given to you today.  *If you need a refill on your cardiac medications before your next appointment, please call your pharmacy*   Lab Work: Your physician recommends that you return for lab work TODAY: BMET, [place] If you have labs (blood work) drawn today and your tests are completely normal, you will receive your results only by: Marland Kitchen MyChart Message (if you have MyChart) OR . A paper copy in the mail If you have any lab test that is abnormal or we need to change your treatment, we will call you to review the results.   Testing/Procedures: None   Follow-Up: At [facility], you and your health needs are our priority.  As part of our continuing mission to provide you with exceptional heart care, we have  created designated Provider Care Teams.  These Care Teams include your primary Cardiologist (physician) and Advanced Practice Providers (APPs -  Physician Assistants and Nurse Practitioners) who all work together to provide you with the care you need, when you need it.  We recommend signing up for the patient portal called "  MyChart".  Sign up information is provided on this After Visit Summary.  MyChart is used to connect with patients for Virtual Visits (Telemedicine).  Patients are able to view lab/test results, encounter notes, upcoming appointments, etc.  Non-urgent messages can be sent to your provider as well.   To learn more about what you can do with MyChart, go to [email or web address].    Your next appointment:   3 month(s)  The format for your next appointment:   In Person  Provider:   Berniece Salines, DO   Other Instructions      Adopting a Healthy Lifestyle.  Know what a healthy weight is for you (roughly BMI <25) and aim to maintain this   Aim for 7+ servings of fruits and vegetables daily   65-80+ fluid ounces of water or unsweet tea for healthy kidneys   Limit to max 1 drink of alcohol per day; avoid smoking/tobacco   Limit animal fats in diet for cholesterol and heart health - choose grass fed whenever available   Avoid highly processed foods, and foods high in saturated/trans fats   Aim for low stress - take time to unwind and care for your mental health   Aim for 150 min of moderate intensity exercise weekly for heart health, and weights twice weekly for bone health   Aim for 7-9 hours of sleep daily   When it comes to diets, agreement about the perfect plan isnt easy to find, even among the experts. Experts at the [employer] developed an idea known as the Healthy Eating Plate. Just imagine a plate divided into logical, healthy portions.   The emphasis is on diet quality:   Load up on vegetables and fruits - one-half of your plate:  Aim for color and variety, and remember that potatoes dont count.   Go for whole grains - one-quarter of your plate: Whole wheat, barley, wheat berries, quinoa, oats, brown rice, and foods made with them. If you want pasta, go with whole wheat pasta.   Protein power - one-quarter of your plate: Fish, chicken, beans, and nuts are all healthy, versatile protein sources. Limit red meat.   The diet, however, does go beyond the plate, offering a few other suggestions.   Use healthy plant oils, such as olive, canola, soy, corn, sunflower and peanut. Check the labels, and avoid partially hydrogenated oil, which have unhealthy trans fats.   If youre thirsty, drink water. Coffee and tea are good in moderation, but skip sugary drinks and limit milk and dairy products to one or two daily servings.   The type of carbohydrate in the diet is more important than the amount. Some sources of carbohydrates, such as vegetables, fruits, whole grains, and beans-are healthier than others.   Finally, stay active  Signed, Berniece Salines, DO  10/26/2020 2:55 PM    [facility]

## 2020-10-26 NOTE — Patient Instructions (Signed)
Medication Instructions:  Your physician recommends that you continue on your current medications as directed. Please refer to the Current Medication list given to you today.  *If you need a refill on your cardiac medications before your next appointment, please call your pharmacy*   Lab Work: Your physician recommends that you return for lab work: TODAY: BMET, [place] If you have labs (blood work) drawn today and your tests are completely normal, you will receive your results only by: . MyChart Message (if you have MyChart) OR . A paper copy in the mail If you have any lab test that is abnormal or we need to change your treatment, we will call you to review the results.   Testing/Procedures: None   Follow-Up: At [facility], you and your health needs are our priority.  As part of our continuing mission to provide you with exceptional heart care, we have created designated Provider Care Teams.  These Care Teams include your primary Cardiologist (physician) and Advanced Practice Providers (APPs -  Physician Assistants and Nurse Practitioners) who all work together to provide you with the care you need, when you need it.  We recommend signing up for the patient portal called "MyChart".  Sign up information is provided on this After Visit Summary.  MyChart is used to connect with patients for Virtual Visits (Telemedicine).  Patients are able to view lab/test results, encounter notes, upcoming appointments, etc.  Non-urgent messages can be sent to your provider as well.   To learn more about what you can do with MyChart, go to [email or web address].    Your next appointment:   3 month(s)  The format for your next appointment:   In Person  Provider:   Kardie Tobb, DO   Other Instructions   

## 2020-10-27 ENCOUNTER — Ambulatory Visit (INDEPENDENT_AMBULATORY_CARE_PROVIDER_SITE_OTHER): Payer: Self-pay

## 2020-10-27 DIAGNOSIS — Z4802 Encounter for removal of sutures: Secondary | ICD-10-CM

## 2020-10-27 LAB — BASIC METABOLIC PANEL
BUN/Creatinine Ratio: 15 (ref 10–24)
BUN: 16 mg/dL (ref 8–27)
CO2: 17 mmol/L — ABNORMAL LOW (ref 20–29)
Calcium: 9.7 mg/dL (ref 8.6–10.2)
Chloride: 101 mmol/L (ref 96–106)
Creatinine, Ser: 1.09 mg/dL (ref 0.76–1.27)
Glucose: 155 mg/dL — ABNORMAL HIGH (ref 65–99)
Potassium: 5 mmol/L (ref 3.5–5.2)
Sodium: 137 mmol/L (ref 134–144)
eGFR: 69 mL/min/{1.73_m2} (ref >59)

## 2020-10-27 LAB — MAGNESIUM: Magnesium: 1.8 mg/dL (ref 1.6–2.3)

## 2020-10-27 NOTE — Progress Notes (Signed)
Patient arrived for nurse visit to remove 3 sutures post- procedure CABG with Dr. Cyndia Bent 10/12/20.  Sutures removed with no signs/ symptoms of infection noted.  Patient tolerated procedure well.  Patient instructed to keep the incision sites clean and dry.  Patient acknowledged instructions given.   Patient stated that he was applying Aquaphor to his surgical incision sites because the incision was "tight" and "painful". Advised to stop applying ointment to incisions and wash with soap and water and pat dry. He acknowledged receipt.

## 2020-11-03 ENCOUNTER — Other Ambulatory Visit: Payer: Self-pay

## 2020-11-03 ENCOUNTER — Ambulatory Visit (INDEPENDENT_AMBULATORY_CARE_PROVIDER_SITE_OTHER): Payer: HMO | Admitting: Physician Assistant

## 2020-11-03 ENCOUNTER — Encounter: Payer: Self-pay | Admitting: Physician Assistant

## 2020-11-03 VITALS — BP 133/83 | HR 109 | Temp 98.0°F | Resp 20 | Ht 73.0 in | Wt 231.0 lb

## 2020-11-03 DIAGNOSIS — I6523 Occlusion and stenosis of bilateral carotid arteries: Secondary | ICD-10-CM

## 2020-11-03 NOTE — Progress Notes (Signed)
  POST OPERATIVE OFFICE NOTE    CC:  F/u for surgery  HPI:  This is a 80 y.o. male who presented right symptomatic carotid stenosis >80% with recent TIA event in February.  He was scheduled for CABG with Dr. Cyndia Bent and we discussed combined carotid/CABG given concern for symptomatic lesion and recent TIA event.   s/p right CEA on 10/12/20 by Dr. Carlis Abbott.        Pt returns today for follow up.  Pt states he is doing fine.  His complains of numbness to touch above and below the right neck incision., other wise he is doing well.  He denise new symptoms of stroke/TIA.    Allergies  Allergen Reactions  . Morphine Other (See Comments)    Makes pt dizzy and lightheaded     Current Outpatient Medications  Medication Sig Dispense Refill  . aspirin EC 81 MG tablet Take 81 mg by mouth daily. Swallow whole.    . DULoxetine (CYMBALTA) 30 MG capsule Take 30 mg by mouth daily.    . empagliflozin (JARDIANCE) 25 MG TABS tablet Take 25 mg by mouth daily.    Marland Kitchen gabapentin (NEURONTIN) 800 MG tablet Take 800 mg by mouth 3 (three) times daily.    Marland Kitchen HYDROcodone-acetaminophen (NORCO/VICODIN) 5-325 MG tablet Take 1 tablet by mouth 3 (three) times daily as needed.    Marland Kitchen LANTUS SOLOSTAR 100 UNIT/ML Solostar Pen Inject 44 Units into the skin at bedtime.    . metFORMIN (GLUCOPHAGE) 1000 MG tablet Take 1,000 mg by mouth 2 (two) times daily with a meal.    . oxymetazoline (NASAL SPRAY 12 HOUR) 0.05 % nasal spray Place 1 spray into both nostrils daily as needed for congestion.    Marland Kitchen OZEMPIC, 0.25 OR 0.5 MG/DOSE, 2 MG/1.5ML SOPN Inject 1 mg into the skin once a week.    . simvastatin (ZOCOR) 80 MG tablet Take 80 mg by mouth at bedtime.     No current facility-administered medications for this visit.     ROS:  See HPI  Physical Exam:    Incision:  Right neck incision well healed without erythema or edema. Extremities:  Moving all 4 ext, grip 5/5 equal B.  Palpable radial pulses B No tongue deviation or facial  droop Lungs : non labored breathing Abdomen:  Soft NTTP, small slowly healing without signs of infection   Assessment/Plan:  This is a 80 y.o. male who is s/p: combined carotid/CABG for symptomatic right carotid stenosis by Dr. Carlis Abbott.   The incision has healed well and he demonstrates no neurologic deficits.  He will f/u in 9 months for surveillance carotid duplex.  The left ICA is < 39% stenosis.  He will continue ASA and daily Statin.     Roxy Horseman PA-C [facility] [phone]   Clinic MD:  Carlis Abbott

## 2020-11-04 ENCOUNTER — Other Ambulatory Visit: Payer: Self-pay | Admitting: *Deleted

## 2020-11-04 DIAGNOSIS — I6523 Occlusion and stenosis of bilateral carotid arteries: Secondary | ICD-10-CM

## 2020-11-10 ENCOUNTER — Other Ambulatory Visit: Payer: Self-pay | Admitting: Surgery

## 2020-11-10 ENCOUNTER — Ambulatory Visit: Payer: HMO | Admitting: Cardiology

## 2020-11-10 DIAGNOSIS — I129 Hypertensive chronic kidney disease with stage 1 through stage 4 chronic kidney disease, or unspecified chronic kidney disease: Secondary | ICD-10-CM | POA: Diagnosis not present

## 2020-11-10 DIAGNOSIS — Z7901 Long term (current) use of anticoagulants: Secondary | ICD-10-CM | POA: Diagnosis not present

## 2020-11-10 DIAGNOSIS — I4891 Unspecified atrial fibrillation: Secondary | ICD-10-CM | POA: Diagnosis not present

## 2020-11-10 DIAGNOSIS — E1122 Type 2 diabetes mellitus with diabetic chronic kidney disease: Secondary | ICD-10-CM | POA: Diagnosis not present

## 2020-11-10 DIAGNOSIS — Z955 Presence of coronary angioplasty implant and graft: Secondary | ICD-10-CM | POA: Diagnosis not present

## 2020-11-10 DIAGNOSIS — E785 Hyperlipidemia, unspecified: Secondary | ICD-10-CM | POA: Diagnosis not present

## 2020-11-10 DIAGNOSIS — Z7984 Long term (current) use of oral hypoglycemic drugs: Secondary | ICD-10-CM | POA: Diagnosis not present

## 2020-11-10 DIAGNOSIS — Z7982 Long term (current) use of aspirin: Secondary | ICD-10-CM | POA: Diagnosis not present

## 2020-11-10 DIAGNOSIS — Z951 Presence of aortocoronary bypass graft: Secondary | ICD-10-CM

## 2020-11-10 DIAGNOSIS — Z8673 Personal history of transient ischemic attack (TIA), and cerebral infarction without residual deficits: Secondary | ICD-10-CM | POA: Diagnosis not present

## 2020-11-10 DIAGNOSIS — N183 Chronic kidney disease, stage 3 unspecified: Secondary | ICD-10-CM | POA: Diagnosis not present

## 2020-11-11 ENCOUNTER — Encounter: Payer: Self-pay | Admitting: Surgery

## 2020-11-11 ENCOUNTER — Ambulatory Visit (INDEPENDENT_AMBULATORY_CARE_PROVIDER_SITE_OTHER): Payer: Self-pay | Admitting: Surgery

## 2020-11-11 ENCOUNTER — Ambulatory Visit
Admission: RE | Admit: 2020-11-11 | Discharge: 2020-11-11 | Disposition: A | Discharge status: Home / Self Care | Payer: HMO | Source: Ambulatory Visit | Attending: Surgery | Admitting: Surgery

## 2020-11-11 ENCOUNTER — Other Ambulatory Visit: Payer: Self-pay

## 2020-11-11 VITALS — BP 130/80 | HR 108 | Resp 20 | Ht 73.0 in | Wt 231.0 lb

## 2020-11-11 DIAGNOSIS — Z951 Presence of aortocoronary bypass graft: Secondary | ICD-10-CM

## 2020-11-11 DIAGNOSIS — J90 Pleural effusion, not elsewhere classified: Secondary | ICD-10-CM | POA: Diagnosis not present

## 2020-11-11 DIAGNOSIS — I517 Cardiomegaly: Secondary | ICD-10-CM | POA: Diagnosis not present

## 2020-11-12 NOTE — Progress Notes (Signed)
    HPI: Patient returns for routine postoperative follow-up having undergone coronary bypass graft surgery x3 and clipping of the left atrial appendage on 10/12/2020. The patient's early postoperative recovery while in the hospital was notable for an uncomplicated postoperative course. Since hospital discharge the patient reports that he has been feeling well.  He is walking daily without chest pain or shortness of breath.  He has started to drive already.   Current Outpatient Medications  Medication Sig Dispense Refill  . aspirin EC 81 MG tablet Take 81 mg by mouth daily. Swallow whole.    . DULoxetine (CYMBALTA) 30 MG capsule Take 30 mg by mouth daily.    . empagliflozin (JARDIANCE) 25 MG TABS tablet Take 25 mg by mouth daily.    Marland Kitchen gabapentin (NEURONTIN) 800 MG tablet Take 800 mg by mouth 3 (three) times daily.    Marland Kitchen HYDROcodone-acetaminophen (NORCO/VICODIN) 5-325 MG tablet Take 1 tablet by mouth 3 (three) times daily as needed.    Marland Kitchen LANTUS SOLOSTAR 100 UNIT/ML Solostar Pen Inject 44 Units into the skin at bedtime.    . metFORMIN (GLUCOPHAGE) 1000 MG tablet Take 1,000 mg by mouth 2 (two) times daily with a meal.    . oxymetazoline (NASAL SPRAY 12 HOUR) 0.05 % nasal spray Place 1 spray into both nostrils daily as needed for congestion.    Marland Kitchen OZEMPIC, 0.25 OR 0.5 MG/DOSE, 2 MG/1.5ML SOPN Inject 1 mg into the skin once a week.    . simvastatin (ZOCOR) 80 MG tablet Take 80 mg by mouth at bedtime.     No current facility-administered medications for this visit.    Physical Exam: BP 130/80 (BP Location: Left Arm, Patient Position: Sitting)   Pulse (!) 108   Resp 20   Ht 6\' 1"  (1.854 m)   Wt 231 lb (104.8 kg)   SpO2 97% Comment: RA  BMI 30.48 kg/m  He looks well. Cardiac exam shows a regular rate and rhythm with normal heart sounds.  There is no murmur. Lungs are clear. The chest incision is healing well and the sternum is stable. His right leg incision is healing well and there is no  lower extremity edema.  Diagnostic Tests:  Narrative & Impression  CLINICAL DATA:  CABG.  EXAM: CHEST - 2 VIEW  COMPARISON:  Chest x-ray 10/17/2020.  FINDINGS: Prior CABG. Left atrial appendage clip in stable position. Cardiomegaly. No pulmonary venous congestion. Mild infiltrate right mid lung cannot be excluded. Small left pleural effusion. No pneumothorax. Neurostimulator noted with tip over the thoracic spine.  IMPRESSION: Prior CABG.  Stable cardiomegaly.  2. Mild infiltrate right mid lung cannot be excluded. Small left pleural effusion.   Electronically Signed   By: Marcello Moores  Register   On: 11/11/2020 15:38      Impression:  Overall she is doing very well following coronary bypass graft surgery.  He has already started to drive.  I asked him not to lift anything heavier than 10 pounds for 3 months postoperatively.  I encouraged him to participate in cardiac rehab and he will think about it.  Plan:  He is going to continue to follow-up with Dr. Harriet Masson for his cardiology care and his PCP, Dr. Nyra Capes.  He will return to see me if he has any problems with his incisions  Gaye Pollack, MD [facility] [phone]

## 2020-11-18 DIAGNOSIS — Z7189 Other specified counseling: Secondary | ICD-10-CM | POA: Diagnosis not present

## 2020-11-18 DIAGNOSIS — I2510 Atherosclerotic heart disease of native coronary artery without angina pectoris: Secondary | ICD-10-CM | POA: Diagnosis not present

## 2020-11-18 DIAGNOSIS — E785 Hyperlipidemia, unspecified: Secondary | ICD-10-CM | POA: Diagnosis not present

## 2020-11-18 DIAGNOSIS — E119 Type 2 diabetes mellitus without complications: Secondary | ICD-10-CM | POA: Diagnosis not present

## 2020-11-18 NOTE — Telephone Encounter (Signed)
Encounter opened in error

## 2020-11-23 DIAGNOSIS — Z8673 Personal history of transient ischemic attack (TIA), and cerebral infarction without residual deficits: Secondary | ICD-10-CM | POA: Diagnosis not present

## 2020-11-23 DIAGNOSIS — Z955 Presence of coronary angioplasty implant and graft: Secondary | ICD-10-CM | POA: Diagnosis not present

## 2020-11-23 DIAGNOSIS — Z7901 Long term (current) use of anticoagulants: Secondary | ICD-10-CM | POA: Diagnosis not present

## 2020-11-23 DIAGNOSIS — E785 Hyperlipidemia, unspecified: Secondary | ICD-10-CM | POA: Diagnosis not present

## 2020-11-23 DIAGNOSIS — N183 Chronic kidney disease, stage 3 unspecified: Secondary | ICD-10-CM | POA: Diagnosis not present

## 2020-11-23 DIAGNOSIS — Z7984 Long term (current) use of oral hypoglycemic drugs: Secondary | ICD-10-CM | POA: Diagnosis not present

## 2020-11-23 DIAGNOSIS — E1122 Type 2 diabetes mellitus with diabetic chronic kidney disease: Secondary | ICD-10-CM | POA: Diagnosis not present

## 2020-11-23 DIAGNOSIS — I129 Hypertensive chronic kidney disease with stage 1 through stage 4 chronic kidney disease, or unspecified chronic kidney disease: Secondary | ICD-10-CM | POA: Diagnosis not present

## 2020-11-23 DIAGNOSIS — I4891 Unspecified atrial fibrillation: Secondary | ICD-10-CM | POA: Diagnosis not present

## 2020-11-24 DIAGNOSIS — Z760 Encounter for issue of repeat prescription: Secondary | ICD-10-CM | POA: Diagnosis not present

## 2020-11-24 DIAGNOSIS — Z9889 Other specified postprocedural states: Secondary | ICD-10-CM | POA: Diagnosis not present

## 2020-11-24 DIAGNOSIS — Z79891 Long term (current) use of opiate analgesic: Secondary | ICD-10-CM | POA: Diagnosis not present

## 2020-11-24 DIAGNOSIS — G8929 Other chronic pain: Secondary | ICD-10-CM | POA: Diagnosis not present

## 2020-11-24 DIAGNOSIS — Z9682 Presence of neurostimulator: Secondary | ICD-10-CM | POA: Diagnosis not present

## 2020-11-24 DIAGNOSIS — M5116 Intervertebral disc disorders with radiculopathy, lumbar region: Secondary | ICD-10-CM | POA: Diagnosis not present

## 2020-11-24 DIAGNOSIS — Z5181 Encounter for therapeutic drug level monitoring: Secondary | ICD-10-CM | POA: Diagnosis not present

## 2020-12-14 ENCOUNTER — Ambulatory Visit: Payer: HMO | Admitting: Cardiology

## 2020-12-18 DIAGNOSIS — I2510 Atherosclerotic heart disease of native coronary artery without angina pectoris: Secondary | ICD-10-CM | POA: Diagnosis not present

## 2020-12-18 DIAGNOSIS — E119 Type 2 diabetes mellitus without complications: Secondary | ICD-10-CM | POA: Diagnosis not present

## 2020-12-18 DIAGNOSIS — E785 Hyperlipidemia, unspecified: Secondary | ICD-10-CM | POA: Diagnosis not present

## 2020-12-28 DIAGNOSIS — L821 Other seborrheic keratosis: Secondary | ICD-10-CM | POA: Diagnosis not present

## 2020-12-28 DIAGNOSIS — L570 Actinic keratosis: Secondary | ICD-10-CM | POA: Diagnosis not present

## 2020-12-28 DIAGNOSIS — D225 Melanocytic nevi of trunk: Secondary | ICD-10-CM | POA: Diagnosis not present

## 2020-12-28 DIAGNOSIS — L814 Other melanin hyperpigmentation: Secondary | ICD-10-CM | POA: Diagnosis not present

## 2020-12-28 DIAGNOSIS — E785 Hyperlipidemia, unspecified: Secondary | ICD-10-CM | POA: Diagnosis not present

## 2021-01-04 DIAGNOSIS — E785 Hyperlipidemia, unspecified: Secondary | ICD-10-CM | POA: Diagnosis not present

## 2021-01-04 DIAGNOSIS — E1129 Type 2 diabetes mellitus with other diabetic kidney complication: Secondary | ICD-10-CM | POA: Diagnosis not present

## 2021-01-04 DIAGNOSIS — N1831 Chronic kidney disease, stage 3a: Secondary | ICD-10-CM | POA: Diagnosis not present

## 2021-01-04 DIAGNOSIS — I2510 Atherosclerotic heart disease of native coronary artery without angina pectoris: Secondary | ICD-10-CM | POA: Diagnosis not present

## 2021-01-18 DIAGNOSIS — E785 Hyperlipidemia, unspecified: Secondary | ICD-10-CM | POA: Diagnosis not present

## 2021-01-18 DIAGNOSIS — N1831 Chronic kidney disease, stage 3a: Secondary | ICD-10-CM | POA: Diagnosis not present

## 2021-01-18 DIAGNOSIS — E1129 Type 2 diabetes mellitus with other diabetic kidney complication: Secondary | ICD-10-CM | POA: Diagnosis not present

## 2021-02-16 DIAGNOSIS — Z0289 Encounter for other administrative examinations: Secondary | ICD-10-CM | POA: Diagnosis not present

## 2021-02-16 DIAGNOSIS — Z9889 Other specified postprocedural states: Secondary | ICD-10-CM | POA: Diagnosis not present

## 2021-02-16 DIAGNOSIS — Z9689 Presence of other specified functional implants: Secondary | ICD-10-CM | POA: Diagnosis not present

## 2021-02-16 DIAGNOSIS — M5136 Other intervertebral disc degeneration, lumbar region: Secondary | ICD-10-CM | POA: Diagnosis not present

## 2021-02-16 DIAGNOSIS — M5416 Radiculopathy, lumbar region: Secondary | ICD-10-CM | POA: Diagnosis not present

## 2021-02-17 ENCOUNTER — Other Ambulatory Visit: Payer: Self-pay

## 2021-02-18 DIAGNOSIS — E785 Hyperlipidemia, unspecified: Secondary | ICD-10-CM | POA: Diagnosis not present

## 2021-02-18 DIAGNOSIS — E1129 Type 2 diabetes mellitus with other diabetic kidney complication: Secondary | ICD-10-CM | POA: Diagnosis not present

## 2021-02-18 DIAGNOSIS — N1831 Chronic kidney disease, stage 3a: Secondary | ICD-10-CM | POA: Diagnosis not present

## 2021-02-19 ENCOUNTER — Other Ambulatory Visit: Payer: Self-pay

## 2021-02-19 ENCOUNTER — Encounter: Payer: Self-pay | Admitting: Cardiology

## 2021-02-19 ENCOUNTER — Ambulatory Visit: Payer: HMO | Admitting: Cardiology

## 2021-02-19 VITALS — BP 104/60 | HR 86 | Ht 73.0 in | Wt 230.0 lb

## 2021-02-19 DIAGNOSIS — Z794 Long term (current) use of insulin: Secondary | ICD-10-CM

## 2021-02-19 DIAGNOSIS — I5022 Chronic systolic (congestive) heart failure: Secondary | ICD-10-CM

## 2021-02-19 DIAGNOSIS — I5020 Unspecified systolic (congestive) heart failure: Secondary | ICD-10-CM

## 2021-02-19 DIAGNOSIS — N183 Chronic kidney disease, stage 3 unspecified: Secondary | ICD-10-CM

## 2021-02-19 DIAGNOSIS — Z8673 Personal history of transient ischemic attack (TIA), and cerebral infarction without residual deficits: Secondary | ICD-10-CM | POA: Diagnosis not present

## 2021-02-19 DIAGNOSIS — I255 Ischemic cardiomyopathy: Secondary | ICD-10-CM | POA: Diagnosis not present

## 2021-02-19 DIAGNOSIS — I480 Paroxysmal atrial fibrillation: Secondary | ICD-10-CM

## 2021-02-19 DIAGNOSIS — Z951 Presence of aortocoronary bypass graft: Secondary | ICD-10-CM | POA: Diagnosis not present

## 2021-02-19 DIAGNOSIS — E118 Type 2 diabetes mellitus with unspecified complications: Secondary | ICD-10-CM

## 2021-02-19 DIAGNOSIS — E782 Mixed hyperlipidemia: Secondary | ICD-10-CM | POA: Diagnosis not present

## 2021-02-19 NOTE — Progress Notes (Signed)
Cardiology Office Note:    Date:  02/20/2021   ID:  Thomas Mckee, DOB 02/22/1941, MRN [id number]  PCP:  Maryella Shivers, MD  Cardiologist:  Berniece Salines, DO  Electrophysiologist:  None   Referring MD: Maryella Shivers, MD   Chief Complaint  Patient presents with   Follow-up    History of Present Illness:    Thomas Mckee is a 80 y.o. male with a hx of  coronary artery disease status post CABG x3 with LIMA to LAD, SVG to diagonal, SVG to PL branch of the RCA, atrial fibrillation status post clipping of the left atrial appendage, depressed ejection fraction most recent EF is 35 to 40%, diabetes mellitus, history of CVA, hyperlipidemia.  I saw him on Oct 26, 2020 he had no angina symptoms.  As of his ischemic cardiomyopathy we were unable to start Entresto on the patient given his low blood pressure.  He was unable to tolerate beta-blocker so that has been stopped as well.  Plan was to restart the patient on Entresto and Aldactone once his blood pressure can tolerate it.  She is here today for follow-up visit.  He is doing well.  He has been doing a lot of activities including going to different swimming classes at the [facility].  He is back to his baseline social life he tells me.   Past Medical History:  Diagnosis Date   Abnormal echocardiogram 08/13/2020   Abnormal ventricular wall motion 08/13/2020   Atrial fibrillation ([place]) 08/13/2020   Bradycardia 10/19/2020   Cancer (HCC)    Skin Cancer on legs per patient 2021   Carotid stenosis    Cerebral arteriosclerosis with history of previous cerebrovascular accident    Chronic kidney disease, stage 3 ([place])    Coronary artery disease involving native coronary artery of native heart 10/26/2020   DDD (degenerative disc disease), lumbar 02/28/2019   Last Assessment & Plan:  Formatting of this note might be different from the original. See status post lumbar laminectomy plan   HFrEF (heart failure with reduced ejection fraction) ([place]) 08/13/2020    History of CVA (cerebrovascular accident) 08/13/2020   Ischemic cardiomyopathy 10/26/2020   LBBB (left bundle branch block) 08/13/2020   Left ventricular dysfunction    Lumbar radiculopathy 02/28/2019   Last Assessment & Plan:  Formatting of this note might be different from the original. Continue 800 mg gabapentin t.i.d..  Unable to obtain Lyrica due to cost   Mixed hyperlipidemia 08/13/2020   Neck pain 02/28/2019   Last Assessment & Plan:  Formatting of this note might be different from the original. Chronic neck pain with radiation to the bilateral shoulders, left greater than right.  He reports having plain films completed by chiropractor do not have access to these.  Will consider updating imaging for possible procedural consideration of persistent symptoms.   Other chronic pain 02/28/2019   Pain management contract agreement 06/27/2019   Last Assessment & Plan:  Formatting of this note might be different from the original. Prior UDS results are consistent with regimen, [place] controlled substance registry is checked and is also consistent.   Paroxysmal atrial fibrillation ([place]) 10/26/2020   Primary hypertension 08/13/2020   S/P CABG x 3 10/12/2020   S/P insertion of spinal cord stimulator 02/28/2019   Last Assessment & Plan:  Formatting of this note might be different from the original. He has established with [employer] rep who is helping him to make adjustments to his programming accordingly.  He needs to follow-up with  them as directed   Sinus bradycardia 10/26/2020   Status post lumbar laminectomy 02/28/2019   Last Assessment & Plan:  Formatting of this note is different from the original. 80 year old male with chronic low back and bilateral lower extremity radicular symptoms status post lumbar laminectomy with subsequent spinal cord stimulator placement.  Recently updated plain films of the thoracic and lumbar spine show moderate lower lumbar facet arthrosis with indwelling Thoracic stimulator  leads ce   Stroke ([place])    Mini stroke per patient [id number]   Systolic CHF ([place])    Type 2 diabetes mellitus with complication, with long-term current use of insulin ([place]) 08/13/2020    Past Surgical History:  Procedure Laterality Date   BACK SURGERY     CLIPPING OF ATRIAL APPENDAGE N/A 10/12/2020   Procedure: CLIPPING OF ATRIAL APPENDAGE;  Surgeon: Gaye Pollack, MD;  Location: [place];  Service: Open Heart Surgery;  Laterality: N/A;   CORONARY ARTERY BYPASS GRAFT N/A 10/12/2020   Procedure: CORONARY ARTERY BYPASS GRAFTING (CABG) X TWO USING LEFT INTERNAL MAMMARY ARTERY AND RIGHT ENDOSCOPIC SAPHENOUS VEIN HARVEST CONDUIT;  Surgeon: Gaye Pollack, MD;  Location: [place];  Service: Open Heart Surgery;  Laterality: N/A;   ENDARTERECTOMY Right 10/12/2020   Procedure: ENDARTERECTOMY CAROTID USING XENOSURE BIOLOGIC PATCH 1CM X 6CM;  Surgeon: [name], MD;  Location: [place];  Service: Vascular;  Laterality: Right;   LEFT HEART CATH AND CORONARY ANGIOGRAPHY N/A 08/18/2020   Procedure: LEFT HEART CATH AND CORONARY ANGIOGRAPHY;  Surgeon: Jettie Booze, MD;  Location: [place];  Service: Cardiovascular;  Laterality: N/A;   REPLACEMENT TOTAL KNEE Right    SHOULDER SURGERY     TEE WITHOUT CARDIOVERSION N/A 10/12/2020   Procedure: TRANSESOPHAGEAL ECHOCARDIOGRAM (TEE);  Surgeon: Gaye Pollack, MD;  Location: [place];  Service: Open Heart Surgery;  Laterality: N/A;    Current Medications: Current Meds  Medication Sig   aspirin EC 81 MG tablet Take 81 mg by mouth daily. Swallow whole.   DULoxetine (CYMBALTA) 30 MG capsule Take 30 mg by mouth daily.   gabapentin (NEURONTIN) 800 MG tablet Take 800 mg by mouth 3 (three) times daily.   HYDROcodone-acetaminophen (NORCO/VICODIN) 5-325 MG tablet Take 1 tablet by mouth 3 (three) times daily as needed for pain.   LANTUS SOLOSTAR 100 UNIT/ML Solostar Pen Inject 44 Units into the skin at bedtime.   lisinopril (ZESTRIL) 5 MG tablet Take 5  mg by mouth daily.   metFORMIN (GLUCOPHAGE) 1000 MG tablet Take 1,000 mg by mouth 2 (two) times daily with a meal.   oxymetazoline (NASAL SPRAY 12 HOUR) 0.05 % nasal spray Place 1 spray into both nostrils daily as needed for congestion.   OZEMPIC, 0.25 OR 0.5 MG/DOSE, 2 MG/1.5ML SOPN Inject 1 mg into the skin once a week.   simvastatin (ZOCOR) 80 MG tablet Take 80 mg by mouth at bedtime.     Allergies:   Morphine   Social History   Socioeconomic History   Marital status: Divorced    Spouse name: Not on file   Number of children: Not on file   Years of education: Not on file   Highest education level: Not on file  Occupational History   Not on file  Tobacco Use   Smoking status: Former    Types: Cigarettes    Quit date: 1979    Years since quitting: 43.7   Smokeless tobacco: Never  Vaping Use   Vaping Use: Never  used  Substance and Sexual Activity   Alcohol use: Not on file   Drug use: Never   Sexual activity: Not Currently  Other Topics Concern   Not on file  Social History Narrative   Not on file   Social Determinants of Health   Financial Resource Strain: Not on file  Food Insecurity: Not on file  Transportation Needs: Not on file  Physical Activity: Not on file  Stress: Not on file  Social Connections: Not on file     Family History: The patient's family history includes Cancer in his brother; Diabetes in his father and sister.  ROS:   Review of Systems  Constitution: Negative for decreased appetite, fever and weight gain.  HENT: Negative for congestion, ear discharge, hoarse voice and sore throat.   Eyes: Negative for discharge, redness, vision loss in right eye and visual halos.  Cardiovascular: Negative for chest pain, dyspnea on exertion, leg swelling, orthopnea and palpitations.  Respiratory: Negative for cough, hemoptysis, shortness of breath and snoring.   Endocrine: Negative for heat intolerance and polyphagia.  Hematologic/Lymphatic: Negative for  bleeding problem. Does not bruise/bleed easily.  Skin: Negative for flushing, nail changes, rash and suspicious lesions.  Musculoskeletal: Negative for arthritis, joint pain, muscle cramps, myalgias, neck pain and stiffness.  Gastrointestinal: Negative for abdominal pain, bowel incontinence, diarrhea and excessive appetite.  Genitourinary: Negative for decreased libido, genital sores and incomplete emptying.  Neurological: Negative for brief paralysis, focal weakness, headaches and loss of balance.  Psychiatric/Behavioral: Negative for altered mental status, depression and suicidal ideas.  Allergic/Immunologic: Negative for HIV exposure and persistent infections.    EKGs/Labs/Other Studies Reviewed:    The following studies were reviewed today:   EKG: None today  Transthoracic echocardiogram October 02, 2020 IMPRESSIONS   1. Left ventricular ejection fraction, by estimation, is 35 to 40%. The  left ventricle has moderately decreased function. The left ventricle  demonstrates regional wall motion abnormalities (see scoring  diagram/findings for description). Left ventricular   diastolic function could not be evaluated.   2. Right ventricular systolic function is normal. The right ventricular  size is normal. Tricuspid regurgitation signal is inadequate for assessing  PA pressure.   3. Left atrial size was mildly dilated.   4. The mitral valve is normal in structure. Trivial mitral valve  regurgitation. No evidence of mitral stenosis. Severe mitral annular  calcification.   5. The aortic valve is tricuspid. There is moderate calcification of the  aortic valve. There is mild thickening of the aortic valve. Aortic valve  regurgitation is not visualized. Mild to moderate aortic valve  sclerosis/calcification is present, without  any evidence of aortic stenosis.   6. The inferior vena cava is normal in size with greater than 50%  respiratory variability, suggesting right atrial pressure  of 3 mmHg.   Recent Labs: 10/08/2020: ALT 30 10/17/2020: Hemoglobin 8.9; Platelets 216 10/26/2020: BUN 16; Creatinine, Ser 1.09; Magnesium 1.8; Potassium 5.0; Sodium 137  Recent Lipid Panel No results found for: CHOL, TRIG, HDL, CHOLHDL, VLDL, LDLCALC, LDLDIRECT  Physical Exam:    VS:  BP 104/60 (BP Location: Right Arm, Patient Position: Sitting, Cuff Size: Normal)   Pulse 86   Ht '6\' 1"'$  (1.854 m)   Wt 230 lb (104.3 kg)   SpO2 97%   BMI 30.34 kg/m     Wt Readings from Last 3 Encounters:  02/19/21 230 lb (104.3 kg)  11/11/20 231 lb (104.8 kg)  11/03/20 231 lb (104.8 kg)  GEN: Well nourished, well developed in no acute distress HEENT: Normal NECK: No JVD; No carotid bruits LYMPHATICS: No lymphadenopathy CARDIAC: S1S2 noted,RRR, no murmurs, rubs, gallops RESPIRATORY:  Clear to auscultation without rales, wheezing or rhonchi  ABDOMEN: Soft, non-tender, non-distended, +bowel sounds, no guarding. EXTREMITIES: No edema, No cyanosis, no clubbing MUSCULOSKELETAL:  No deformity  SKIN: Warm and dry NEUROLOGIC:  Alert and oriented x 3, non-focal PSYCHIATRIC:  Normal affect, good insight  ASSESSMENT:    1. Chronic systolic congestive heart failure (HCC)   2. HFrEF (heart failure with reduced ejection fraction) (HCC)   3. Paroxysmal atrial fibrillation ([place])   4. Ischemic cardiomyopathy   5. Type 2 diabetes mellitus with complication, with long-term current use of insulin (HCC)   6. Stage 3 chronic kidney disease, unspecified whether stage 3a or 3b CKD ([place])   7. Mixed hyperlipidemia   8. History of CVA (cerebrovascular accident)   9. S/P CABG x 3    PLAN:     Unfortunately his blood pressure still is marginal and I am unable to start him on Entresto so we will just keep him on the low-dose of lisinopril, and as noted earlier he has had significant bradycardia with beta-blockers so he is not taking this medication as well.  Hopefully once he is able to get some blood pressure  Aldactone can be started.  I would favor.  The echocardiogram to reassess his LV function.  He status post left atrial appendage closure.  Hyperlipidemia - continue with current statin medication.  The patient is in agreement with the above plan. The patient left the office in stable condition.  The patient will follow up in 6 months with Dr. Agustin Cree.   Medication Adjustments/Labs and Tests Ordered: Current medicines are reviewed at length with the patient today.  Concerns regarding medicines are outlined above.  No orders of the defined types were placed in this encounter.  No orders of the defined types were placed in this encounter.   Patient Instructions  Medication Instructions:  Your physician recommends that you continue on your current medications as directed. Please refer to the Current Medication list given to you today.  *If you need a refill on your cardiac medications before your next appointment, please call your pharmacy*   Lab Work: None If you have labs (blood work) drawn today and your tests are completely normal, you will receive your results only by: [place] (if you have MyChart) OR A paper copy in the mail If you have any lab test that is abnormal or we need to change your treatment, we will call you to review the results.   Testing/Procedures: None   Follow-Up: At [facility], you and your health needs are our priority.  As part of our continuing mission to provide you with exceptional heart care, we have created designated Provider Care Teams.  These Care Teams include your primary Cardiologist (physician) and Advanced Practice Providers (APPs -  Physician Assistants and Nurse Practitioners) who all work together to provide you with the care you need, when you need it.  We recommend signing up for the patient portal called "MyChart".  Sign up information is provided on this After Visit Summary.  MyChart is used to connect with patients for  Virtual Visits (Telemedicine).  Patients are able to view lab/test results, encounter notes, upcoming appointments, etc.  Non-urgent messages can be sent to your provider as well.   To learn more about what you can do with MyChart, go  to [email or web address].    Your next appointment:   6 month(s)  The format for your next appointment:   In Person  Provider:   Jenne Campus, MD   Other Instructions    Adopting a Healthy Lifestyle.  Know what a healthy weight is for you (roughly BMI <25) and aim to maintain this   Aim for 7+ servings of fruits and vegetables daily   65-80+ fluid ounces of water or unsweet tea for healthy kidneys   Limit to max 1 drink of alcohol per day; avoid smoking/tobacco   Limit animal fats in diet for cholesterol and heart health - choose grass fed whenever available   Avoid highly processed foods, and foods high in saturated/trans fats   Aim for low stress - take time to unwind and care for your mental health   Aim for 150 min of moderate intensity exercise weekly for heart health, and weights twice weekly for bone health   Aim for 7-9 hours of sleep daily   When it comes to diets, agreement about the perfect plan isnt easy to find, even among the experts. Experts at the [employer] developed an idea known as the Healthy Eating Plate. Just imagine a plate divided into logical, healthy portions.   The emphasis is on diet quality:   Load up on vegetables and fruits - one-half of your plate: Aim for color and variety, and remember that potatoes dont count.   Go for whole grains - one-quarter of your plate: Whole wheat, barley, wheat berries, quinoa, oats, brown rice, and foods made with them. If you want pasta, go with whole wheat pasta.   Protein power - one-quarter of your plate: Fish, chicken, beans, and nuts are all healthy, versatile protein sources. Limit red meat.   The diet, however, does go beyond the plate,  offering a few other suggestions.   Use healthy plant oils, such as olive, canola, soy, corn, sunflower and peanut. Check the labels, and avoid partially hydrogenated oil, which have unhealthy trans fats.   If youre thirsty, drink water. Coffee and tea are good in moderation, but skip sugary drinks and limit milk and dairy products to one or two daily servings.   The type of carbohydrate in the diet is more important than the amount. Some sources of carbohydrates, such as vegetables, fruits, whole grains, and beans-are healthier than others.   Finally, stay active  Signed, Berniece Salines, DO  02/20/2021 1:00 PM    [place]

## 2021-02-19 NOTE — Patient Instructions (Signed)

## 2021-03-20 DIAGNOSIS — E785 Hyperlipidemia, unspecified: Secondary | ICD-10-CM | POA: Diagnosis not present

## 2021-03-20 DIAGNOSIS — E1129 Type 2 diabetes mellitus with other diabetic kidney complication: Secondary | ICD-10-CM | POA: Diagnosis not present

## 2021-04-20 DIAGNOSIS — E785 Hyperlipidemia, unspecified: Secondary | ICD-10-CM | POA: Diagnosis not present

## 2021-04-20 DIAGNOSIS — E1129 Type 2 diabetes mellitus with other diabetic kidney complication: Secondary | ICD-10-CM | POA: Diagnosis not present

## 2021-04-30 DIAGNOSIS — Z136 Encounter for screening for cardiovascular disorders: Secondary | ICD-10-CM | POA: Diagnosis not present

## 2021-04-30 DIAGNOSIS — I2510 Atherosclerotic heart disease of native coronary artery without angina pectoris: Secondary | ICD-10-CM | POA: Diagnosis not present

## 2021-04-30 DIAGNOSIS — Z0000 Encounter for general adult medical examination without abnormal findings: Secondary | ICD-10-CM | POA: Diagnosis not present

## 2021-04-30 DIAGNOSIS — E785 Hyperlipidemia, unspecified: Secondary | ICD-10-CM | POA: Diagnosis not present

## 2021-04-30 DIAGNOSIS — Z139 Encounter for screening, unspecified: Secondary | ICD-10-CM | POA: Diagnosis not present

## 2021-04-30 DIAGNOSIS — E1129 Type 2 diabetes mellitus with other diabetic kidney complication: Secondary | ICD-10-CM | POA: Diagnosis not present

## 2021-04-30 DIAGNOSIS — N1831 Chronic kidney disease, stage 3a: Secondary | ICD-10-CM | POA: Diagnosis not present

## 2021-05-11 DIAGNOSIS — G8929 Other chronic pain: Secondary | ICD-10-CM | POA: Diagnosis not present

## 2021-05-11 DIAGNOSIS — Z79899 Other long term (current) drug therapy: Secondary | ICD-10-CM | POA: Diagnosis not present

## 2021-05-11 DIAGNOSIS — M542 Cervicalgia: Secondary | ICD-10-CM | POA: Diagnosis not present

## 2021-05-11 DIAGNOSIS — M961 Postlaminectomy syndrome, not elsewhere classified: Secondary | ICD-10-CM | POA: Diagnosis not present

## 2021-05-11 DIAGNOSIS — Z9682 Presence of neurostimulator: Secondary | ICD-10-CM | POA: Diagnosis not present

## 2021-05-11 DIAGNOSIS — Z760 Encounter for issue of repeat prescription: Secondary | ICD-10-CM | POA: Diagnosis not present

## 2021-05-11 DIAGNOSIS — M5416 Radiculopathy, lumbar region: Secondary | ICD-10-CM | POA: Diagnosis not present

## 2021-05-11 DIAGNOSIS — F1190 Opioid use, unspecified, uncomplicated: Secondary | ICD-10-CM

## 2021-05-11 HISTORY — DX: Opioid use, unspecified, uncomplicated: F11.90

## 2021-05-19 DIAGNOSIS — N1831 Chronic kidney disease, stage 3a: Secondary | ICD-10-CM | POA: Diagnosis not present

## 2021-05-20 DIAGNOSIS — E1129 Type 2 diabetes mellitus with other diabetic kidney complication: Secondary | ICD-10-CM | POA: Diagnosis not present

## 2021-05-20 DIAGNOSIS — E785 Hyperlipidemia, unspecified: Secondary | ICD-10-CM | POA: Diagnosis not present

## 2021-05-21 DIAGNOSIS — N1831 Chronic kidney disease, stage 3a: Secondary | ICD-10-CM | POA: Diagnosis not present

## 2021-05-21 DIAGNOSIS — Z6832 Body mass index (BMI) 32.0-32.9, adult: Secondary | ICD-10-CM | POA: Diagnosis not present

## 2021-06-30 DIAGNOSIS — L570 Actinic keratosis: Secondary | ICD-10-CM | POA: Diagnosis not present

## 2021-06-30 DIAGNOSIS — L578 Other skin changes due to chronic exposure to nonionizing radiation: Secondary | ICD-10-CM | POA: Diagnosis not present

## 2021-06-30 DIAGNOSIS — L814 Other melanin hyperpigmentation: Secondary | ICD-10-CM | POA: Diagnosis not present

## 2021-06-30 DIAGNOSIS — Z85820 Personal history of malignant melanoma of skin: Secondary | ICD-10-CM | POA: Diagnosis not present

## 2021-06-30 DIAGNOSIS — L300 Nummular dermatitis: Secondary | ICD-10-CM | POA: Diagnosis not present

## 2021-07-21 DIAGNOSIS — N1831 Chronic kidney disease, stage 3a: Secondary | ICD-10-CM | POA: Diagnosis not present

## 2021-07-21 DIAGNOSIS — E785 Hyperlipidemia, unspecified: Secondary | ICD-10-CM | POA: Diagnosis not present

## 2021-07-21 DIAGNOSIS — E1129 Type 2 diabetes mellitus with other diabetic kidney complication: Secondary | ICD-10-CM | POA: Diagnosis not present

## 2021-07-22 DIAGNOSIS — M1712 Unilateral primary osteoarthritis, left knee: Secondary | ICD-10-CM | POA: Diagnosis not present

## 2021-07-22 DIAGNOSIS — Z5181 Encounter for therapeutic drug level monitoring: Secondary | ICD-10-CM

## 2021-07-22 DIAGNOSIS — M5416 Radiculopathy, lumbar region: Secondary | ICD-10-CM | POA: Diagnosis not present

## 2021-07-22 DIAGNOSIS — G8929 Other chronic pain: Secondary | ICD-10-CM | POA: Diagnosis not present

## 2021-07-22 DIAGNOSIS — M779 Enthesopathy, unspecified: Secondary | ICD-10-CM | POA: Diagnosis not present

## 2021-07-22 DIAGNOSIS — M25562 Pain in left knee: Secondary | ICD-10-CM | POA: Diagnosis not present

## 2021-07-22 HISTORY — DX: Encounter for therapeutic drug level monitoring: Z51.81

## 2021-07-22 HISTORY — DX: Other chronic pain: G89.29

## 2021-08-03 ENCOUNTER — Other Ambulatory Visit: Payer: Self-pay | Admitting: Cardiology

## 2021-08-17 DIAGNOSIS — M961 Postlaminectomy syndrome, not elsewhere classified: Secondary | ICD-10-CM | POA: Diagnosis not present

## 2021-08-17 DIAGNOSIS — Z9682 Presence of neurostimulator: Secondary | ICD-10-CM | POA: Diagnosis not present

## 2021-08-17 DIAGNOSIS — Z79891 Long term (current) use of opiate analgesic: Secondary | ICD-10-CM | POA: Diagnosis not present

## 2021-08-17 DIAGNOSIS — M5116 Intervertebral disc disorders with radiculopathy, lumbar region: Secondary | ICD-10-CM | POA: Diagnosis not present

## 2021-08-17 DIAGNOSIS — M25562 Pain in left knee: Secondary | ICD-10-CM | POA: Diagnosis not present

## 2021-08-17 DIAGNOSIS — Z760 Encounter for issue of repeat prescription: Secondary | ICD-10-CM | POA: Diagnosis not present

## 2021-08-17 DIAGNOSIS — Z79899 Other long term (current) drug therapy: Secondary | ICD-10-CM | POA: Diagnosis not present

## 2021-08-17 DIAGNOSIS — G8929 Other chronic pain: Secondary | ICD-10-CM | POA: Diagnosis not present

## 2021-08-18 ENCOUNTER — Other Ambulatory Visit: Payer: Self-pay

## 2021-08-18 DIAGNOSIS — E1129 Type 2 diabetes mellitus with other diabetic kidney complication: Secondary | ICD-10-CM | POA: Diagnosis not present

## 2021-08-18 DIAGNOSIS — E785 Hyperlipidemia, unspecified: Secondary | ICD-10-CM | POA: Diagnosis not present

## 2021-08-19 ENCOUNTER — Other Ambulatory Visit: Payer: Self-pay

## 2021-08-19 ENCOUNTER — Ambulatory Visit: Payer: HMO | Admitting: Cardiology

## 2021-08-19 ENCOUNTER — Encounter: Payer: Self-pay | Admitting: Cardiology

## 2021-08-19 VITALS — BP 128/74 | HR 67 | Ht 73.0 in | Wt 227.8 lb

## 2021-08-19 DIAGNOSIS — I6523 Occlusion and stenosis of bilateral carotid arteries: Secondary | ICD-10-CM | POA: Diagnosis not present

## 2021-08-19 DIAGNOSIS — I5022 Chronic systolic (congestive) heart failure: Secondary | ICD-10-CM | POA: Diagnosis not present

## 2021-08-19 DIAGNOSIS — Z951 Presence of aortocoronary bypass graft: Secondary | ICD-10-CM

## 2021-08-19 DIAGNOSIS — I255 Ischemic cardiomyopathy: Secondary | ICD-10-CM | POA: Diagnosis not present

## 2021-08-19 NOTE — Patient Instructions (Addendum)
Medication Instructions:  ?Your physician recommends that you continue on your current medications as directed. Please refer to the Current Medication list given to you today. ? ?*If you need a refill on your cardiac medications before your next appointment, please call your pharmacy* ? ? ?Lab Work: ?Your physician recommends that you return for lab work in:  ? ?Labs today: CBC,BMP, stool for guaic ? ?If you have labs (blood work) drawn today and your tests are completely normal, you will receive your results only by: ?MyChart Message (if you have MyChart) OR ?A paper copy in the mail ?If you have any lab test that is abnormal or we need to change your treatment, we will call you to review the results. ? ? ?Testing/Procedures: ?Your physician has requested that you have a carotid duplex. This test is an ultrasound of the carotid arteries in your neck. It looks at blood flow through these arteries that supply the brain with blood. Allow one hour for this exam. There are no restrictions or special instructions.  ? ?Your physician has requested that you have an echocardiogram. Echocardiography is a painless test that uses sound waves to create images of your heart. It provides your doctor with information about the size and shape of your heart and how well your heart?s chambers and valves are working. This procedure takes approximately one hour. There are no restrictions for this procedure. ? ? ? ?Follow-Up: ?At [facility], you and your health needs are our priority.  As part of our continuing mission to provide you with exceptional heart care, we have created designated Provider Care Teams.  These Care Teams include your primary Cardiologist (physician) and Advanced Practice Providers (APPs -  Physician Assistants and Nurse Practitioners) who all work together to provide you with the care you need, when you need it. ? ?We recommend signing up for the patient portal called "MyChart".  Sign up information is  provided on this After Visit Summary.  MyChart is used to connect with patients for Virtual Visits (Telemedicine).  Patients are able to view lab/test results, encounter notes, upcoming appointments, etc.  Non-urgent messages can be sent to your provider as well.   ?To learn more about what you can do with MyChart, go to [email or web address].   ? ?Your next appointment:   ?3 month(s) ? ?The format for your next appointment:   ?In Person ? ?Provider:   ?Jenne Campus, MD  ? ? ?Other Instructions ?None ? ?

## 2021-08-19 NOTE — Progress Notes (Signed)
Cardiology Office Note:    Date:  08/19/2021   ID:  Thomas Mckee, DOB [birth year], MRN [id number]  PCP:  Maryella Shivers, MD  Cardiologist:  Jenne Campus, MD    Referring MD: Maryella Shivers, MD   Chief Complaint  Patient presents with   Follow-up  I am doing well  History of Present Illness:    Thomas Mckee is a 81 y.o. male with past medical history significant for coronary artery disease.  Status post coronary bypass graft done in May 2022 with LIMA to LAD, SVG diagonal, SVG to.  PL,, status post clipping of the left atrial appendage, atrial fibrillation which appears to be persistent, cardiomyopathy ejection fraction 35 to 40%, history of CVA, dyslipidemia, carotic arterial disease with last test done in April showing 60 to 79% stenosis on the right. Comes today 2 months for follow-up.  Overall he is doing well.  He denies any chest pain tightness squeezing pressure burning chest.  He goes to gym on the regular basis he swims and he enjoyed his tremendously.  Past Medical History:  Diagnosis Date   Abnormal echocardiogram 08/13/2020   Abnormal ventricular wall motion 08/13/2020   Atrial fibrillation ([place]) 08/13/2020   Bradycardia 10/19/2020   Cancer (HCC)    Skin Cancer on legs per patient 2021   Carotid stenosis    Cerebral arteriosclerosis with history of previous cerebrovascular accident    Chronic kidney disease, stage 3 (HCC)    Chronic pain of left knee 07/22/2021   Last Assessment & Plan:  Formatting of this note might be different from the original. Previous left knee intra-articular injection with excellent benefit.  Patient reports greater than 80% improvement in his left knee pain.  He is participating in aqua therapy 4-5 times weekly at [facility].  He will continue with his current medication regimen as previously outlined.  Patient understands we    Chronic, continuous use of opioids 05/11/2021   Last Assessment & Plan:  Formatting of this note might be  different from the original. Patient and I have discussed the hazardous effects of continued opiate pain medication usage. Risks and benefits of above medications including but not limited to possibility of hyperalgesia, respiratory depression, sedation, and even death were discussed with the patient who expressed an understanding. Patient    Coronary artery disease involving native coronary artery of native heart 10/26/2020   DDD (degenerative disc disease), lumbar 02/28/2019   Last Assessment & Plan:  Formatting of this note might be different from the original. See status post lumbar laminectomy plan   Encounter for therapeutic drug level monitoring 07/22/2021   HFrEF (heart failure with reduced ejection fraction) ([name]) 08/13/2020   History of CVA (cerebrovascular accident) 08/13/2020   Ischemic cardiomyopathy 10/26/2020   LBBB (left bundle branch block) 08/13/2020   Left ventricular dysfunction    Lumbar post-laminectomy syndrome 02/28/2019   Last Assessment & Plan:  Formatting of this note might be different from the original. Patient seen today in routine follow-up for his chronic lower back pain with bilateral, left greater than right lower extremity radicular pain.  He is status post previous L4-L5 decompression with subsequent spinal cord stimulator placement.  He continues to follow with [employer] representative for any spinal cord    Lumbar radiculopathy 02/28/2019   Last Assessment & Plan:  Formatting of this note might be different from the original. Continue 800 mg gabapentin t.i.d..  Unable to obtain Lyrica due to cost   Mixed hyperlipidemia 08/13/2020  Neck pain 02/28/2019   Last Assessment & Plan:  Formatting of this note might be different from the original. Chronic neck pain with radiation to the bilateral shoulders, left greater than right.  He reports having plain films completed by chiropractor do not have access to these.  Will consider updating imaging for possible procedural consideration of  persistent symptoms.   Other chronic pain 02/28/2019   Pain management contract agreement 06/27/2019   Last Assessment & Plan:  Formatting of this note might be different from the original. Prior UDS results are consistent with regimen, [place] controlled substance registry is checked and is also consistent.   Pain medication agreement 06/27/2019   Last Assessment & Plan:  Formatting of this note might be different from the original. Agreement up-to-date.   UDS to be collected in compliance with clinic policies and procedures.   Patient did not display any signs of abuse or diversion and has been checked on the [facility] Controlled Substance website.   Paroxysmal atrial fibrillation ([place]) 10/26/2020   Primary hypertension 08/13/2020   S/P CABG x 3 10/12/2020   S/P insertion of spinal cord stimulator 02/28/2019   Last Assessment & Plan:  Formatting of this note might be different from the original. He has established with [employer] rep who is helping him to make adjustments to his programming accordingly.  He needs to follow-up with them as directed   Sinus bradycardia 10/26/2020   Status post lumbar laminectomy 02/28/2019   Last Assessment & Plan:  Formatting of this note is different from the original. 81 year old male with chronic low back and bilateral lower extremity radicular symptoms status post lumbar laminectomy with subsequent spinal cord stimulator placement.  Recently updated plain films of the thoracic and lumbar spine show moderate lower lumbar facet arthrosis with indwelling Thoracic stimulator leads ce   Stroke ([place])    Mini stroke per patient 1/61/0960   Systolic CHF ([place])    Type 2 diabetes mellitus with complication, with long-term current use of insulin ([place]) 08/13/2020    Past Surgical History:  Procedure Laterality Date   BACK SURGERY     CLIPPING OF ATRIAL APPENDAGE N/A 10/12/2020   Procedure: CLIPPING OF ATRIAL APPENDAGE;  Surgeon: Gaye Pollack, MD;  Location: [place];   Service: Open Heart Surgery;  Laterality: N/A;   CORONARY ARTERY BYPASS GRAFT N/A 10/12/2020   Procedure: CORONARY ARTERY BYPASS GRAFTING (CABG) X TWO USING LEFT INTERNAL MAMMARY ARTERY AND RIGHT ENDOSCOPIC SAPHENOUS VEIN HARVEST CONDUIT;  Surgeon: Gaye Pollack, MD;  Location: [place];  Service: Open Heart Surgery;  Laterality: N/A;   ENDARTERECTOMY Right 10/12/2020   Procedure: ENDARTERECTOMY CAROTID USING XENOSURE BIOLOGIC PATCH 1CM X 6CM;  Surgeon: [name], MD;  Location: [place];  Service: Vascular;  Laterality: Right;   LEFT HEART CATH AND CORONARY ANGIOGRAPHY N/A 08/18/2020   Procedure: LEFT HEART CATH AND CORONARY ANGIOGRAPHY;  Surgeon: Jettie Booze, MD;  Location: [place];  Service: Cardiovascular;  Laterality: N/A;   REPLACEMENT TOTAL KNEE Right    SHOULDER SURGERY     TEE WITHOUT CARDIOVERSION N/A 10/12/2020   Procedure: TRANSESOPHAGEAL ECHOCARDIOGRAM (TEE);  Surgeon: Gaye Pollack, MD;  Location: [facility];  Service: Open Heart Surgery;  Laterality: N/A;    Current Medications: Current Meds  Medication Sig   aspirin EC 81 MG tablet Take 81 mg by mouth daily. Swallow whole.   DULoxetine (CYMBALTA) 30 MG capsule Take 30 mg by mouth daily.  gabapentin (NEURONTIN) 800 MG tablet Take 800 mg by mouth 3 (three) times daily.   HYDROcodone-acetaminophen (NORCO/VICODIN) 5-325 MG tablet Take 1 tablet by mouth 3 (three) times daily as needed for pain.   LANTUS SOLOSTAR 100 UNIT/ML Solostar Pen Inject 44 Units into the skin at bedtime.   lisinopril (ZESTRIL) 5 MG tablet Take 5 mg by mouth daily.   metFORMIN (GLUCOPHAGE) 1000 MG tablet Take 1,000 mg by mouth 2 (two) times daily with a meal.   oxymetazoline (NASAL SPRAY 12 HOUR) 0.05 % nasal spray Place 1 spray into both nostrils daily as needed for congestion.   OZEMPIC, 0.25 OR 0.5 MG/DOSE, 2 MG/1.5ML SOPN Inject 1 mg into the skin once a week.   simvastatin (ZOCOR) 80 MG tablet Take 80 mg by mouth at bedtime.    [DISCONTINUED] DULoxetine (CYMBALTA) 30 MG capsule Take by mouth.     Allergies:   Morphine   Social History   Socioeconomic History   Marital status: Divorced    Spouse name: Not on file   Number of children: Not on file   Years of education: Not on file   Highest education level: Not on file  Occupational History   Not on file  Tobacco Use   Smoking status: Former    Types: Cigarettes    Quit date: 86    Years since quitting: 44.1   Smokeless tobacco: Never  Vaping Use   Vaping Use: Never used  Substance and Sexual Activity   Alcohol use: Not on file   Drug use: Never   Sexual activity: Not Currently  Other Topics Concern   Not on file  Social History Narrative   Not on file   Social Determinants of Health   Financial Resource Strain: Not on file  Food Insecurity: Not on file  Transportation Needs: Not on file  Physical Activity: Not on file  Stress: Not on file  Social Connections: Not on file     Family History: The patient's family history includes Cancer in his brother; Diabetes in his father and sister. ROS:   Please see the history of present illness.    All 14 point review of systems negative except as described per history of present illness  EKGs/Labs/Other Studies Reviewed:      Recent Labs: 10/08/2020: ALT 30 10/17/2020: Hemoglobin 8.9; Platelets 216 10/26/2020: BUN 16; Creatinine, Ser 1.09; Magnesium 1.8; Potassium 5.0; Sodium 137  Recent Lipid Panel No results found for: CHOL, TRIG, HDL, CHOLHDL, VLDL, LDLCALC, LDLDIRECT  Physical Exam:    VS:  BP 128/74 (BP Location: Left Arm, Patient Position: Sitting)    Pulse 67    Ht 6\' 1"  (1.854 m)    Wt 227 lb 12.8 oz (103.3 kg)    SpO2 97%    BMI 30.05 kg/m     Wt Readings from Last 3 Encounters:  08/19/21 227 lb 12.8 oz (103.3 kg)  02/19/21 230 lb (104.3 kg)  11/11/20 231 lb (104.8 kg)     GEN:  Well nourished, well developed in no acute distress HEENT: Normal NECK: No JVD; No carotid  bruits LYMPHATICS: No lymphadenopathy CARDIAC: RRR, no murmurs, no rubs, no gallops RESPIRATORY:  Clear to auscultation without rales, wheezing or rhonchi  ABDOMEN: Soft, non-tender, non-distended MUSCULOSKELETAL:  No edema; No deformity  SKIN: Warm and dry LOWER EXTREMITIES: no swelling NEUROLOGIC:  Alert and oriented x 3 PSYCHIATRIC:  Normal affect   ASSESSMENT:    1. S/P CABG x 3   2. Ischemic  cardiomyopathy   3. Chronic systolic congestive heart failure ([place])   4. Bilateral carotid artery stenosis    PLAN:    In order of problems listed above:  Status post coronary bypass graft.  Denies having anginal symptomatology.  Overall doing very well and happy with the results of his surgery.  He is on antiplatelet therapy which I will continue. History of cardiomyopathy, sadly, he had difficulty tolerating medications.  Because of low blood pressure he is only on small dose of lisinopril which I will continue for now.  Also there is history of bradycardia that is why he is not on a beta-blocker.  I will ask him to have his echocardiogram repeated to recheck left ventricle ejection fraction.  I have feeling that his ejection fraction improved since clinically he is doing so well. Paroxysmal atrial flutter/fibrillation probably persistent now.  We will do EKG today to check the rhythm.  He is not anticoagulated.  He did have atrial clipping done.  We will continue present management.  Not anticoagulated Bilateral carotic arterial stenosis.  Carotic ultrasounds will be redone   Medication Adjustments/Labs and Tests Ordered: Current medicines are reviewed at length with the patient today.  Concerns regarding medicines are outlined above.  No orders of the defined types were placed in this encounter.  Medication changes: No orders of the defined types were placed in this encounter.   Signed, Park Liter, MD, [facility] 08/19/2021 11:45 AM    [place]

## 2021-08-20 DIAGNOSIS — I255 Ischemic cardiomyopathy: Secondary | ICD-10-CM | POA: Diagnosis not present

## 2021-08-20 DIAGNOSIS — Z951 Presence of aortocoronary bypass graft: Secondary | ICD-10-CM | POA: Diagnosis not present

## 2021-08-20 DIAGNOSIS — E785 Hyperlipidemia, unspecified: Secondary | ICD-10-CM | POA: Diagnosis not present

## 2021-08-20 DIAGNOSIS — I6523 Occlusion and stenosis of bilateral carotid arteries: Secondary | ICD-10-CM | POA: Diagnosis not present

## 2021-08-20 DIAGNOSIS — I5022 Chronic systolic (congestive) heart failure: Secondary | ICD-10-CM | POA: Diagnosis not present

## 2021-08-20 LAB — CBC
Hematocrit: 39.5 % (ref 37.5–51.0)
Hemoglobin: 13.1 g/dL (ref 13.0–17.7)
MCH: 27.7 pg (ref 26.6–33.0)
MCHC: 33.2 g/dL (ref 31.5–35.7)
MCV: 84 fL (ref 79–97)
Platelets: 326 10*3/uL (ref 150–450)
RBC: 4.73 x10E6/uL (ref 4.14–5.80)
RDW: 14.7 % (ref 11.6–15.4)
WBC: 9 10*3/uL (ref 3.4–10.8)

## 2021-08-20 LAB — BASIC METABOLIC PANEL
BUN/Creatinine Ratio: 19 (ref 10–24)
BUN: 22 mg/dL (ref 8–27)
CO2: 20 mmol/L (ref 20–29)
Calcium: 9.8 mg/dL (ref 8.6–10.2)
Chloride: 101 mmol/L (ref 96–106)
Creatinine, Ser: 1.17 mg/dL (ref 0.76–1.27)
Glucose: 242 mg/dL — ABNORMAL HIGH (ref 70–99)
Potassium: 5.4 mmol/L — ABNORMAL HIGH (ref 3.5–5.2)
Sodium: 136 mmol/L (ref 134–144)
eGFR: 64 mL/min/{1.73_m2} (ref >59)

## 2021-08-21 LAB — FECAL OCCULT BLOOD, IMMUNOCHEMICAL: Fecal Occult Bld: NEGATIVE

## 2021-08-26 ENCOUNTER — Ambulatory Visit (INDEPENDENT_AMBULATORY_CARE_PROVIDER_SITE_OTHER): Payer: HMO

## 2021-08-26 ENCOUNTER — Other Ambulatory Visit: Payer: Self-pay

## 2021-08-26 DIAGNOSIS — I5022 Chronic systolic (congestive) heart failure: Secondary | ICD-10-CM

## 2021-08-26 DIAGNOSIS — I255 Ischemic cardiomyopathy: Secondary | ICD-10-CM

## 2021-08-26 DIAGNOSIS — Z951 Presence of aortocoronary bypass graft: Secondary | ICD-10-CM

## 2021-08-26 DIAGNOSIS — I6523 Occlusion and stenosis of bilateral carotid arteries: Secondary | ICD-10-CM | POA: Diagnosis not present

## 2021-08-26 LAB — ECHOCARDIOGRAM COMPLETE
AR max vel: 1.3 cm2
AV Area VTI: 1.21 cm2
AV Area mean vel: 1.32 cm2
AV Mean grad: 10.5 mmHg
AV Peak grad: 18.9 mmHg
Ao pk vel: 2.18 m/s
Area-P 1/2: 3.76 cm2
S' Lateral: 3.3 cm

## 2021-08-26 MED ORDER — PERFLUTREN LIPID MICROSPHERE
1.0000 mL | INTRAVENOUS | Status: AC | PRN
  Administered 2021-08-26: 4 mL via INTRAVENOUS

## 2021-08-26 NOTE — Progress Notes (Unsigned)
HISTORY AND PHYSICAL     CC:  follow up. Requesting Provider:  Maryella Shivers, MD  HPI: This is a 81 y.o. male here for follow up for carotid artery stenosis.  Pt is s/p combined CABG and right CEA for symptomatic carotid artery stenosis on 10/12/2020 by Dr. Carlis Abbott.    Pt was last seen after surgery on 11/03/2020 and at that time he was doing well without any new stroke sx.  He was instructed to continue his asa/statin and return in 9 months with duplex.   Pt returns today for follow up.    Pt *** any amaurosis fugax, speech difficulties, weakness, numbness, paralysis or clumsiness or facial droop.    ***  The pt is on a statin for cholesterol management.  The pt is on a daily aspirin.   Other AC:  none The pt is on ACEI for hypertension.   The pt is diabetic.   Tobacco hx:  former  Pt does *** have family hx of AAA.  Past Medical History:  Diagnosis Date   Abnormal echocardiogram 08/13/2020   Abnormal ventricular wall motion 08/13/2020   Atrial fibrillation ([place]) 08/13/2020   Bradycardia 10/19/2020   Cancer (HCC)    Skin Cancer on legs per patient 2021   Carotid stenosis    Cerebral arteriosclerosis with history of previous cerebrovascular accident    Chronic kidney disease, stage 3 (HCC)    Chronic pain of left knee 07/22/2021   Last Assessment & Plan:  Formatting of this note might be different from the original. Previous left knee intra-articular injection with excellent benefit.  Patient reports greater than 80% improvement in his left knee pain.  He is participating in aqua [facility].  He will continue with his current medication regimen as previously outlined.  Patient understands we    Chronic, continuous use of opioids 05/11/2021   Last Assessment & Plan:  Formatting of this note might be different from the original. Patient and I have discussed the hazardous effects of continued opiate pain medication usage. Risks and benefits of above  medications including but not limited to possibility of hyperalgesia, respiratory depression, sedation, and even death were discussed with the patient who expressed an understanding. Patient    Coronary artery disease involving native coronary artery of native heart 10/26/2020   DDD (degenerative disc disease), lumbar 02/28/2019   Last Assessment & Plan:  Formatting of this note might be different from the original. See status post lumbar laminectomy plan   Encounter for therapeutic drug level monitoring 07/22/2021   HFrEF (heart failure with reduced ejection fraction) ([place]) 08/13/2020   History of CVA (cerebrovascular accident) 08/13/2020   Ischemic cardiomyopathy 10/26/2020   LBBB (left bundle branch block) 08/13/2020   Left ventricular dysfunction    Lumbar post-laminectomy syndrome 02/28/2019   Last Assessment & Plan:  Formatting of this note might be different from the original. Patient seen today in routine follow-up for his chronic lower back pain with bilateral, left greater than right lower extremity radicular pain.  He is status post previous L4-L5 decompression with subsequent spinal cord stimulator placement.  He continues to follow with [employer] representative for any spinal cord    Lumbar radiculopathy 02/28/2019   Last Assessment & Plan:  Formatting of this note might be different from the original. Continue 800 mg gabapentin t.i.d..  Unable to obtain Lyrica due to cost   Mixed hyperlipidemia 08/13/2020   Neck pain 02/28/2019   Last Assessment &  Plan:  Formatting of this note might be different from the original. Chronic neck pain with radiation to the bilateral shoulders, left greater than right.  He reports having plain films completed by chiropractor do not have access to these.  Will consider updating imaging for possible procedural consideration of persistent symptoms.   Other chronic pain 02/28/2019   Pain management contract agreement 06/27/2019   Last Assessment & Plan:  Formatting of this  note might be different from the original. Prior UDS results are consistent with regimen, [place] controlled substance registry is checked and is also consistent.   Pain medication agreement 06/27/2019   Last Assessment & Plan:  Formatting of this note might be different from the original. Agreement up-to-date.   UDS to be collected in compliance with clinic policies and procedures.   Patient did not display any signs of abuse or diversion and has been checked on the [facility] Controlled Substance website.   Paroxysmal atrial fibrillation ([place]) 10/26/2020   Primary hypertension 08/13/2020   S/P CABG x 3 10/12/2020   S/P insertion of spinal cord stimulator 02/28/2019   Last Assessment & Plan:  Formatting of this note might be different from the original. He has established with [employer] rep who is helping him to make adjustments to his programming accordingly.  He needs to follow-up with them as directed   Sinus bradycardia 10/26/2020   Status post lumbar laminectomy 02/28/2019   Last Assessment & Plan:  Formatting of this note is different from the original. 81 year old male with chronic low back and bilateral lower extremity radicular symptoms status post lumbar laminectomy with subsequent spinal cord stimulator placement.  Recently updated plain films of the thoracic and lumbar spine show moderate lower lumbar facet arthrosis with indwelling Thoracic stimulator leads ce   Stroke ([place])    Mini stroke per patient 8/33/8250   Systolic CHF ([place])    Type 2 diabetes mellitus with complication, with long-term current use of insulin ([place]) 08/13/2020    Past Surgical History:  Procedure Laterality Date   BACK SURGERY     CLIPPING OF ATRIAL APPENDAGE N/A 10/12/2020   Procedure: CLIPPING OF ATRIAL APPENDAGE;  Surgeon: Gaye Pollack, MD;  Location: [place];  Service: Open Heart Surgery;  Laterality: N/A;   CORONARY ARTERY BYPASS GRAFT N/A 10/12/2020   Procedure: CORONARY ARTERY BYPASS GRAFTING (CABG) X TWO  USING LEFT INTERNAL MAMMARY ARTERY AND RIGHT ENDOSCOPIC SAPHENOUS VEIN HARVEST CONDUIT;  Surgeon: Gaye Pollack, MD;  Location: [place];  Service: Open Heart Surgery;  Laterality: N/A;   ENDARTERECTOMY Right 10/12/2020   Procedure: ENDARTERECTOMY CAROTID USING XENOSURE BIOLOGIC PATCH 1CM X 6CM;  Surgeon: [name], MD;  Location: [facility];  Service: Vascular;  Laterality: Right;   LEFT HEART CATH AND CORONARY ANGIOGRAPHY N/A 08/18/2020   Procedure: LEFT HEART CATH AND CORONARY ANGIOGRAPHY;  Surgeon: Jettie Booze, MD;  Location: [place] [facility];  Service: Cardiovascular;  Laterality: N/A;   REPLACEMENT TOTAL KNEE Right    SHOULDER SURGERY     TEE WITHOUT CARDIOVERSION N/A 10/12/2020   Procedure: TRANSESOPHAGEAL ECHOCARDIOGRAM (TEE);  Surgeon: Gaye Pollack, MD;  Location: [place];  Service: Open Heart Surgery;  Laterality: N/A;    Allergies  Allergen Reactions   Morphine Other (See Comments)    Makes pt dizzy and lightheaded     Current Outpatient Medications  Medication Sig Dispense Refill   aspirin EC 81 MG tablet Take 81 mg by mouth daily. Swallow whole.  DULoxetine (CYMBALTA) 30 MG capsule Take 30 mg by mouth daily.     gabapentin (NEURONTIN) 800 MG tablet Take 800 mg by mouth 3 (three) times daily.     HYDROcodone-acetaminophen (NORCO/VICODIN) 5-325 MG tablet Take 1 tablet by mouth 3 (three) times daily as needed for pain.     LANTUS SOLOSTAR 100 UNIT/ML Solostar Pen Inject 44 Units into the skin at bedtime.     lisinopril (ZESTRIL) 5 MG tablet Take 5 mg by mouth daily.     metFORMIN (GLUCOPHAGE) 1000 MG tablet Take 1,000 mg by mouth 2 (two) times daily with a meal.     oxymetazoline (NASAL SPRAY 12 HOUR) 0.05 % nasal spray Place 1 spray into both nostrils daily as needed for congestion.     OZEMPIC, 0.25 OR 0.5 MG/DOSE, 2 MG/1.5ML SOPN Inject 1 mg into the skin once a week.     simvastatin (ZOCOR) 80 MG tablet Take 80 mg by mouth at bedtime.     No current  facility-administered medications for this visit.    Family History  Problem Relation Age of Onset   Diabetes Father    Diabetes Sister    Cancer Brother     Social History   Socioeconomic History   Marital status: Divorced    Spouse name: Not on file   Number of children: Not on file   Years of education: Not on file   Highest education level: Not on file  Occupational History   Not on file  Tobacco Use   Smoking status: Former    Types: Cigarettes    Quit date: 1979    Years since quitting: 44.2   Smokeless tobacco: Never  Vaping Use   Vaping Use: Never used  Substance and Sexual Activity   Alcohol use: Not on file   Drug use: Never   Sexual activity: Not Currently  Other Topics Concern   Not on file  Social History Narrative   Not on file   Social Determinants of Health   Financial Resource Strain: Not on file  Food Insecurity: Not on file  Transportation Needs: Not on file  Physical Activity: Not on file  Stress: Not on file  Social Connections: Not on file  Intimate Partner Violence: Not on file     REVIEW OF SYSTEMS:  *** '[X]'$  denotes positive finding, '[ ]'$  denotes negative finding Cardiac  Comments:  Chest pain or chest pressure:    Shortness of breath upon exertion:    Short of breath when lying flat:    Irregular heart rhythm:        Vascular    Pain in calf, thigh, or hip brought on by ambulation:    Pain in feet at night that wakes you up from your sleep:     Blood clot in your veins:    Leg swelling:         Pulmonary    Oxygen at home:    Productive cough:     Wheezing:         Neurologic    Sudden weakness in arms or legs:     Sudden numbness in arms or legs:     Sudden onset of difficulty speaking or slurred speech:    Temporary loss of vision in one eye:     Problems with dizziness:         Gastrointestinal    Blood in stool:     Vomited blood:  Genitourinary    Burning when urinating:     Blood in urine:         Psychiatric    Major depression:         Hematologic    Bleeding problems:    Problems with blood clotting too easily:        Skin    Rashes or ulcers:        Constitutional    Fever or chills:      PHYSICAL EXAMINATION:  ***  General:  WDWN in NAD; vital signs documented above Gait: Not observed HENT: WNL, normocephalic Pulmonary: normal non-labored breathing Cardiac: {Desc; regular/irreg:14544} HR, {With/Without:20273} carotid bruit*** Abdomen: soft, NT; aortic pulse is *** palpable Skin: {With/Without:20273} rashes Vascular Exam/Pulses:  Right Left  Radial {Exam; arterial pulse strength 0-4:30167} {Exam; arterial pulse strength 0-4:30167}  Popliteal {Exam; arterial pulse strength 0-4:30167} {Exam; arterial pulse strength 0-4:30167}  DP {Exam; arterial pulse strength 0-4:30167} {Exam; arterial pulse strength 0-4:30167}  PT {Exam; arterial pulse strength 0-4:30167} {Exam; arterial pulse strength 0-4:30167}   Extremities: {With/Without:20273} ischemic changes, {With/Without:20273} Gangrene , {With/Without:20273} cellulitis; {With/Without:20273} open wounds Musculoskeletal: no muscle wasting or atrophy  Neurologic: A&O X 3; moving all extremities equally; speech is fluent/normal Psychiatric:  The pt has {Desc; normal/abnormal:11317::"Normal"} affect.   Non-Invasive Vascular Imaging:   Carotid Duplex on 08/26/2021: Right:  ***% ICA stenosis Left:  ***% ICA stenosis ***  Previous Carotid duplex on 10/02/2020: Right: 60-79% ICA stenosis Left:   1-39% ICA stenosis    ASSESSMENT/PLAN:: 81 y.o. male here for follow up carotid artery stenosis and is s/p combined CABG and right CEA for symptomatic carotid artery stenosis on 10/12/2020 by Dr. Carlis Abbott.    -duplex today reveals *** -discussed s/s of stroke with pt and ***he understands should ***he develop any of these sx, ***he will go to the nearest ER or call 911. -pt will f/u in *** with carotid duplex -pt will call sooner  should they have any issues. -continue statin/asa ***  Leontine Locket, [facility] [phone]  Clinic MD:  ***

## 2021-08-27 DIAGNOSIS — N1831 Chronic kidney disease, stage 3a: Secondary | ICD-10-CM | POA: Diagnosis not present

## 2021-08-27 DIAGNOSIS — E785 Hyperlipidemia, unspecified: Secondary | ICD-10-CM | POA: Diagnosis not present

## 2021-08-27 DIAGNOSIS — R110 Nausea: Secondary | ICD-10-CM | POA: Diagnosis not present

## 2021-08-27 DIAGNOSIS — Z6830 Body mass index (BMI) 30.0-30.9, adult: Secondary | ICD-10-CM | POA: Diagnosis not present

## 2021-08-31 ENCOUNTER — Other Ambulatory Visit: Payer: Self-pay

## 2021-08-31 ENCOUNTER — Encounter (HOSPITAL_COMMUNITY): Payer: HMO

## 2021-08-31 ENCOUNTER — Encounter: Payer: Self-pay | Admitting: Physician Assistant

## 2021-08-31 ENCOUNTER — Ambulatory Visit: Payer: HMO | Admitting: Physician Assistant

## 2021-08-31 VITALS — BP 135/75 | HR 62 | Temp 97.2°F | Resp 18 | Ht 72.0 in | Wt 226.2 lb

## 2021-08-31 DIAGNOSIS — I6523 Occlusion and stenosis of bilateral carotid arteries: Secondary | ICD-10-CM | POA: Diagnosis not present

## 2021-09-02 ENCOUNTER — Telehealth: Payer: Self-pay | Admitting: Cardiology

## 2021-09-02 NOTE — Telephone Encounter (Signed)
Patient returning call for echo results. 

## 2021-09-02 NOTE — Telephone Encounter (Signed)
Park Liter, MD  ?08/27/2021  3:09 PM EST   ?  ?Echocardiogram showed preserved left ventricle ejection fraction, overall looks good  ? ?

## 2021-09-02 NOTE — Telephone Encounter (Signed)
I spoke with patient and discussed echo results, no questions from patient,pcp copied ?

## 2021-09-14 ENCOUNTER — Telehealth: Payer: Self-pay

## 2021-09-14 NOTE — Telephone Encounter (Signed)
Unable to reach the patient multiple occasions, as a last attempt, a mailed a letter requesting a call back.  ?

## 2021-09-18 DIAGNOSIS — E1121 Type 2 diabetes mellitus with diabetic nephropathy: Secondary | ICD-10-CM | POA: Diagnosis not present

## 2021-09-18 DIAGNOSIS — E785 Hyperlipidemia, unspecified: Secondary | ICD-10-CM | POA: Diagnosis not present

## 2021-09-24 DIAGNOSIS — Z7984 Long term (current) use of oral hypoglycemic drugs: Secondary | ICD-10-CM | POA: Diagnosis not present

## 2021-09-24 DIAGNOSIS — Z9682 Presence of neurostimulator: Secondary | ICD-10-CM | POA: Diagnosis not present

## 2021-09-24 DIAGNOSIS — M5136 Other intervertebral disc degeneration, lumbar region: Secondary | ICD-10-CM | POA: Diagnosis not present

## 2021-09-24 DIAGNOSIS — Z7982 Long term (current) use of aspirin: Secondary | ICD-10-CM | POA: Diagnosis not present

## 2021-09-24 DIAGNOSIS — Z7985 Long-term (current) use of injectable non-insulin antidiabetic drugs: Secondary | ICD-10-CM | POA: Diagnosis not present

## 2021-09-24 DIAGNOSIS — E119 Type 2 diabetes mellitus without complications: Secondary | ICD-10-CM | POA: Diagnosis not present

## 2021-09-24 DIAGNOSIS — D692 Other nonthrombocytopenic purpura: Secondary | ICD-10-CM | POA: Diagnosis not present

## 2021-09-24 DIAGNOSIS — Z794 Long term (current) use of insulin: Secondary | ICD-10-CM | POA: Diagnosis not present

## 2021-09-24 DIAGNOSIS — Z9889 Other specified postprocedural states: Secondary | ICD-10-CM | POA: Diagnosis not present

## 2021-09-24 DIAGNOSIS — Z8673 Personal history of transient ischemic attack (TIA), and cerebral infarction without residual deficits: Secondary | ICD-10-CM | POA: Diagnosis not present

## 2021-09-24 DIAGNOSIS — I10 Essential (primary) hypertension: Secondary | ICD-10-CM | POA: Diagnosis not present

## 2021-09-24 DIAGNOSIS — E785 Hyperlipidemia, unspecified: Secondary | ICD-10-CM | POA: Diagnosis not present

## 2021-10-04 DIAGNOSIS — L578 Other skin changes due to chronic exposure to nonionizing radiation: Secondary | ICD-10-CM | POA: Diagnosis not present

## 2021-10-04 DIAGNOSIS — L570 Actinic keratosis: Secondary | ICD-10-CM | POA: Diagnosis not present

## 2021-10-04 DIAGNOSIS — L299 Pruritus, unspecified: Secondary | ICD-10-CM | POA: Diagnosis not present

## 2021-10-04 DIAGNOSIS — L821 Other seborrheic keratosis: Secondary | ICD-10-CM | POA: Diagnosis not present

## 2021-10-04 DIAGNOSIS — L209 Atopic dermatitis, unspecified: Secondary | ICD-10-CM | POA: Diagnosis not present

## 2021-10-18 DIAGNOSIS — E1121 Type 2 diabetes mellitus with diabetic nephropathy: Secondary | ICD-10-CM | POA: Diagnosis not present

## 2021-10-18 DIAGNOSIS — E785 Hyperlipidemia, unspecified: Secondary | ICD-10-CM | POA: Diagnosis not present

## 2021-11-09 DIAGNOSIS — M961 Postlaminectomy syndrome, not elsewhere classified: Secondary | ICD-10-CM | POA: Diagnosis not present

## 2021-11-09 DIAGNOSIS — Z760 Encounter for issue of repeat prescription: Secondary | ICD-10-CM | POA: Diagnosis not present

## 2021-11-09 DIAGNOSIS — Z6831 Body mass index (BMI) 31.0-31.9, adult: Secondary | ICD-10-CM | POA: Diagnosis not present

## 2021-11-09 DIAGNOSIS — G8929 Other chronic pain: Secondary | ICD-10-CM | POA: Diagnosis not present

## 2021-11-09 DIAGNOSIS — M542 Cervicalgia: Secondary | ICD-10-CM | POA: Diagnosis not present

## 2021-11-09 DIAGNOSIS — Z79899 Other long term (current) drug therapy: Secondary | ICD-10-CM | POA: Diagnosis not present

## 2021-11-09 DIAGNOSIS — M5416 Radiculopathy, lumbar region: Secondary | ICD-10-CM | POA: Diagnosis not present

## 2021-11-09 DIAGNOSIS — Z79891 Long term (current) use of opiate analgesic: Secondary | ICD-10-CM | POA: Diagnosis not present

## 2021-11-09 DIAGNOSIS — E6609 Other obesity due to excess calories: Secondary | ICD-10-CM | POA: Diagnosis not present

## 2021-11-09 DIAGNOSIS — M25562 Pain in left knee: Secondary | ICD-10-CM | POA: Diagnosis not present

## 2021-11-18 DIAGNOSIS — E1121 Type 2 diabetes mellitus with diabetic nephropathy: Secondary | ICD-10-CM | POA: Diagnosis not present

## 2021-11-18 DIAGNOSIS — E785 Hyperlipidemia, unspecified: Secondary | ICD-10-CM | POA: Diagnosis not present

## 2021-11-19 ENCOUNTER — Ambulatory Visit: Payer: HMO | Admitting: Cardiology

## 2021-11-27 ENCOUNTER — Other Ambulatory Visit: Payer: Self-pay | Admitting: Cardiology

## 2021-11-29 NOTE — Telephone Encounter (Signed)
Rx refill sent to pharmacy. 

## 2021-12-01 DIAGNOSIS — E1129 Type 2 diabetes mellitus with other diabetic kidney complication: Secondary | ICD-10-CM | POA: Diagnosis not present

## 2021-12-01 DIAGNOSIS — E785 Hyperlipidemia, unspecified: Secondary | ICD-10-CM | POA: Diagnosis not present

## 2021-12-06 DIAGNOSIS — E1165 Type 2 diabetes mellitus with hyperglycemia: Secondary | ICD-10-CM | POA: Diagnosis not present

## 2021-12-06 DIAGNOSIS — N182 Chronic kidney disease, stage 2 (mild): Secondary | ICD-10-CM | POA: Diagnosis not present

## 2021-12-06 DIAGNOSIS — E785 Hyperlipidemia, unspecified: Secondary | ICD-10-CM | POA: Diagnosis not present

## 2021-12-06 DIAGNOSIS — I2510 Atherosclerotic heart disease of native coronary artery without angina pectoris: Secondary | ICD-10-CM | POA: Diagnosis not present

## 2021-12-06 DIAGNOSIS — Z6830 Body mass index (BMI) 30.0-30.9, adult: Secondary | ICD-10-CM | POA: Diagnosis not present

## 2021-12-06 DIAGNOSIS — E1121 Type 2 diabetes mellitus with diabetic nephropathy: Secondary | ICD-10-CM | POA: Diagnosis not present

## 2021-12-09 DIAGNOSIS — E113293 Type 2 diabetes mellitus with mild nonproliferative diabetic retinopathy without macular edema, bilateral: Secondary | ICD-10-CM | POA: Diagnosis not present

## 2021-12-09 DIAGNOSIS — H40013 Open angle with borderline findings, low risk, bilateral: Secondary | ICD-10-CM | POA: Diagnosis not present

## 2021-12-18 DIAGNOSIS — E785 Hyperlipidemia, unspecified: Secondary | ICD-10-CM | POA: Diagnosis not present

## 2021-12-18 DIAGNOSIS — E1121 Type 2 diabetes mellitus with diabetic nephropathy: Secondary | ICD-10-CM | POA: Diagnosis not present

## 2021-12-28 DIAGNOSIS — Z7985 Long-term (current) use of injectable non-insulin antidiabetic drugs: Secondary | ICD-10-CM | POA: Diagnosis not present

## 2021-12-28 DIAGNOSIS — Z794 Long term (current) use of insulin: Secondary | ICD-10-CM | POA: Diagnosis not present

## 2021-12-28 DIAGNOSIS — E119 Type 2 diabetes mellitus without complications: Secondary | ICD-10-CM | POA: Diagnosis not present

## 2021-12-28 DIAGNOSIS — Z7984 Long term (current) use of oral hypoglycemic drugs: Secondary | ICD-10-CM | POA: Diagnosis not present

## 2021-12-28 DIAGNOSIS — I10 Essential (primary) hypertension: Secondary | ICD-10-CM | POA: Diagnosis not present

## 2022-01-18 DIAGNOSIS — E785 Hyperlipidemia, unspecified: Secondary | ICD-10-CM | POA: Diagnosis not present

## 2022-01-18 DIAGNOSIS — E1121 Type 2 diabetes mellitus with diabetic nephropathy: Secondary | ICD-10-CM | POA: Diagnosis not present

## 2022-02-07 DIAGNOSIS — F1190 Opioid use, unspecified, uncomplicated: Secondary | ICD-10-CM | POA: Diagnosis not present

## 2022-02-07 DIAGNOSIS — Z6829 Body mass index (BMI) 29.0-29.9, adult: Secondary | ICD-10-CM | POA: Diagnosis not present

## 2022-02-07 DIAGNOSIS — M5416 Radiculopathy, lumbar region: Secondary | ICD-10-CM | POA: Diagnosis not present

## 2022-02-07 DIAGNOSIS — M542 Cervicalgia: Secondary | ICD-10-CM | POA: Diagnosis not present

## 2022-02-07 DIAGNOSIS — M5136 Other intervertebral disc degeneration, lumbar region: Secondary | ICD-10-CM | POA: Diagnosis not present

## 2022-02-07 DIAGNOSIS — M961 Postlaminectomy syndrome, not elsewhere classified: Secondary | ICD-10-CM | POA: Diagnosis not present

## 2022-02-07 DIAGNOSIS — G8929 Other chronic pain: Secondary | ICD-10-CM | POA: Diagnosis not present

## 2022-02-07 DIAGNOSIS — Z9689 Presence of other specified functional implants: Secondary | ICD-10-CM | POA: Diagnosis not present

## 2022-02-07 DIAGNOSIS — M25562 Pain in left knee: Secondary | ICD-10-CM | POA: Diagnosis not present

## 2022-02-09 ENCOUNTER — Ambulatory Visit: Payer: HMO | Admitting: Cardiology

## 2022-02-13 IMAGING — DX DG CHEST 1V PORT
1 series · 1 of 1 positions shown · non-contrast
Comparison: Chest radiograph dated 10/08/2020.

CLINICAL DATA: 80-year-old male status post CABG.

EXAM:
PORTABLE CHEST 1 VIEW

[chest ap]
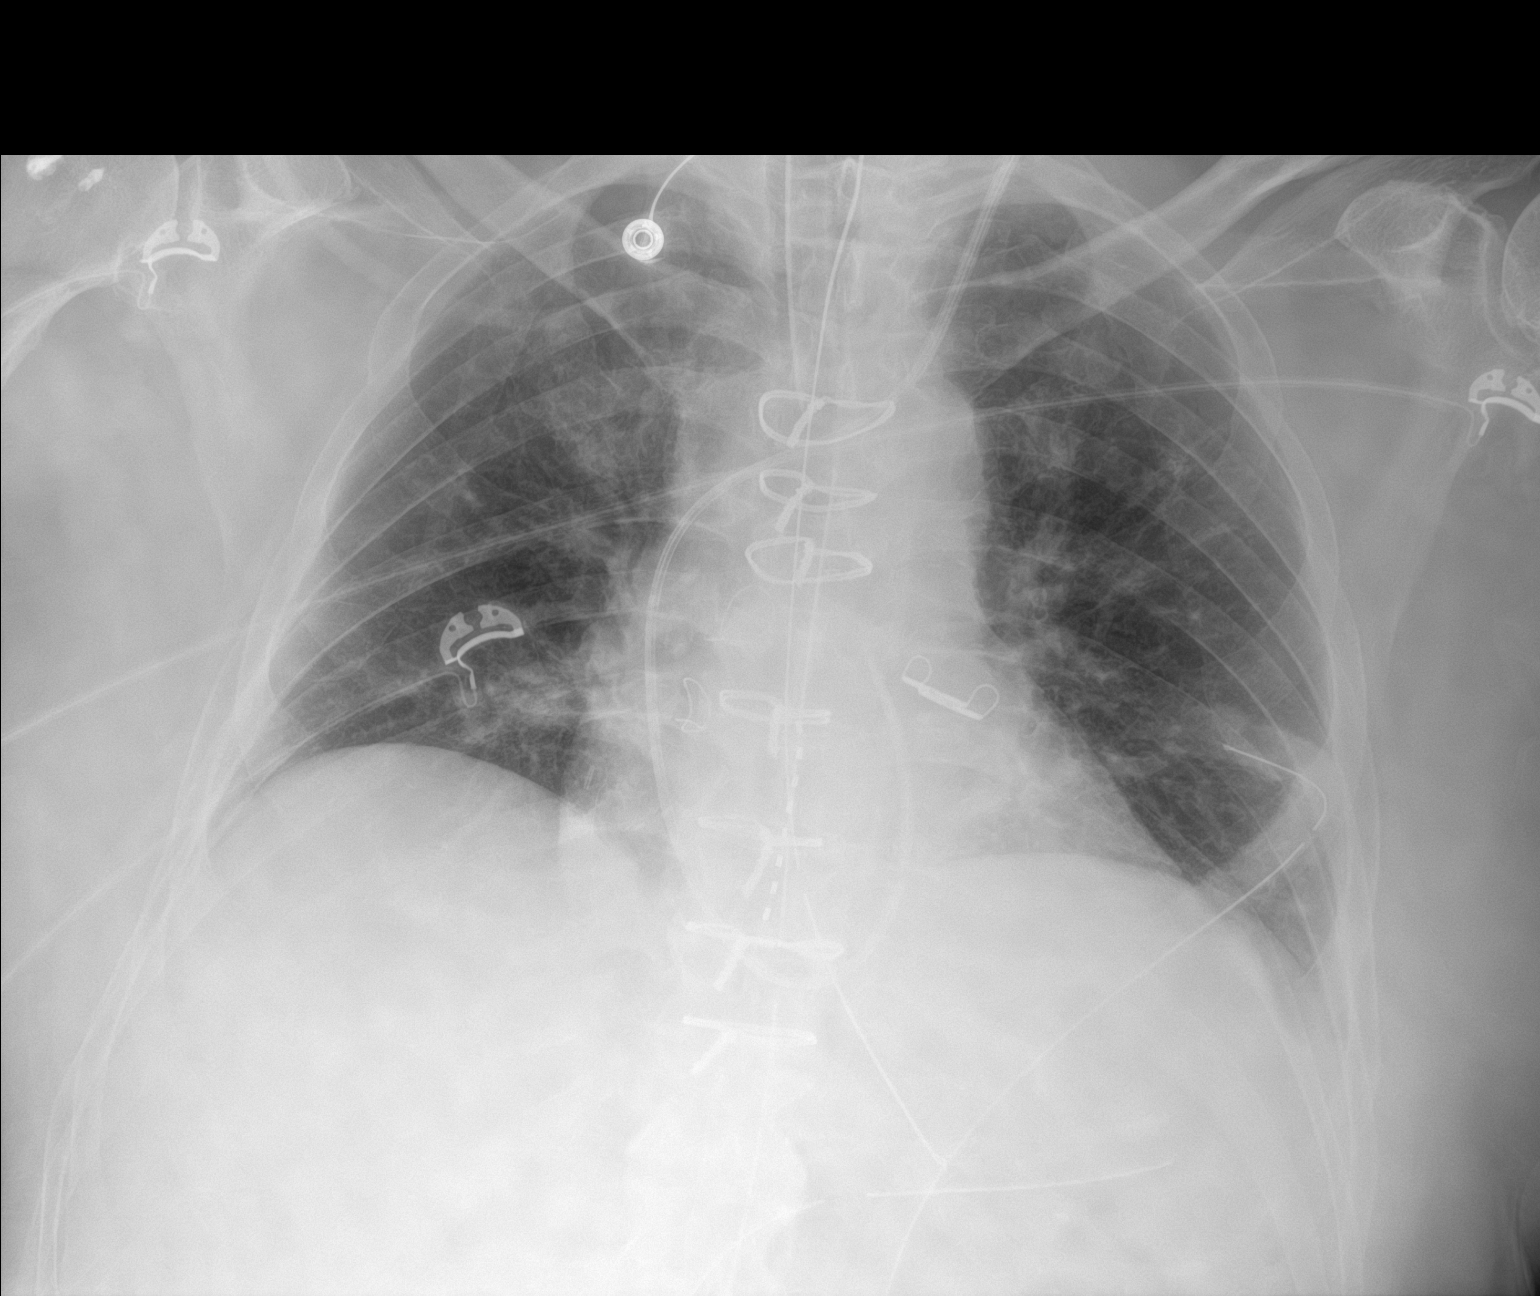

[1 of 1 positions shown; findings below may reference images not displayed]

FINDINGS: Endotracheal tube with tip approximately 3.5 cm above the carina.
Enteric tube extends below the diaphragm with side-port in the
region of the GE junction and tip in the proximal stomach. Recommend
further advancing of the tube by additional 6 cm. Left IJ Swan-Ganz
catheter with tip likely at the bifurcation of the main pulmonary
trunk. Left-sided chest tube and inferiorly accessed drain with tip
over the left upper abdomen.

No focal consolidation, pleural effusion, pneumothorax. Minimal
bibasilar atelectasis. Mild cardiomegaly. No acute osseous
pathology. Median sternotomy wires and postsurgical changes of CABG.
IMPRESSION: Postsurgical changes of CABG with support apparatus as above.
Recommend further advancing of the enteric tube by additional 6 cm.

## 2022-02-14 IMAGING — DX DG CHEST 1V PORT
1 series · 1 of 1 positions shown · non-contrast
Comparison: 10/12/2020.

CLINICAL DATA: Chest tube.  Status post open heart surgery.

EXAM:
PORTABLE CHEST 1 VIEW

[chest ap]
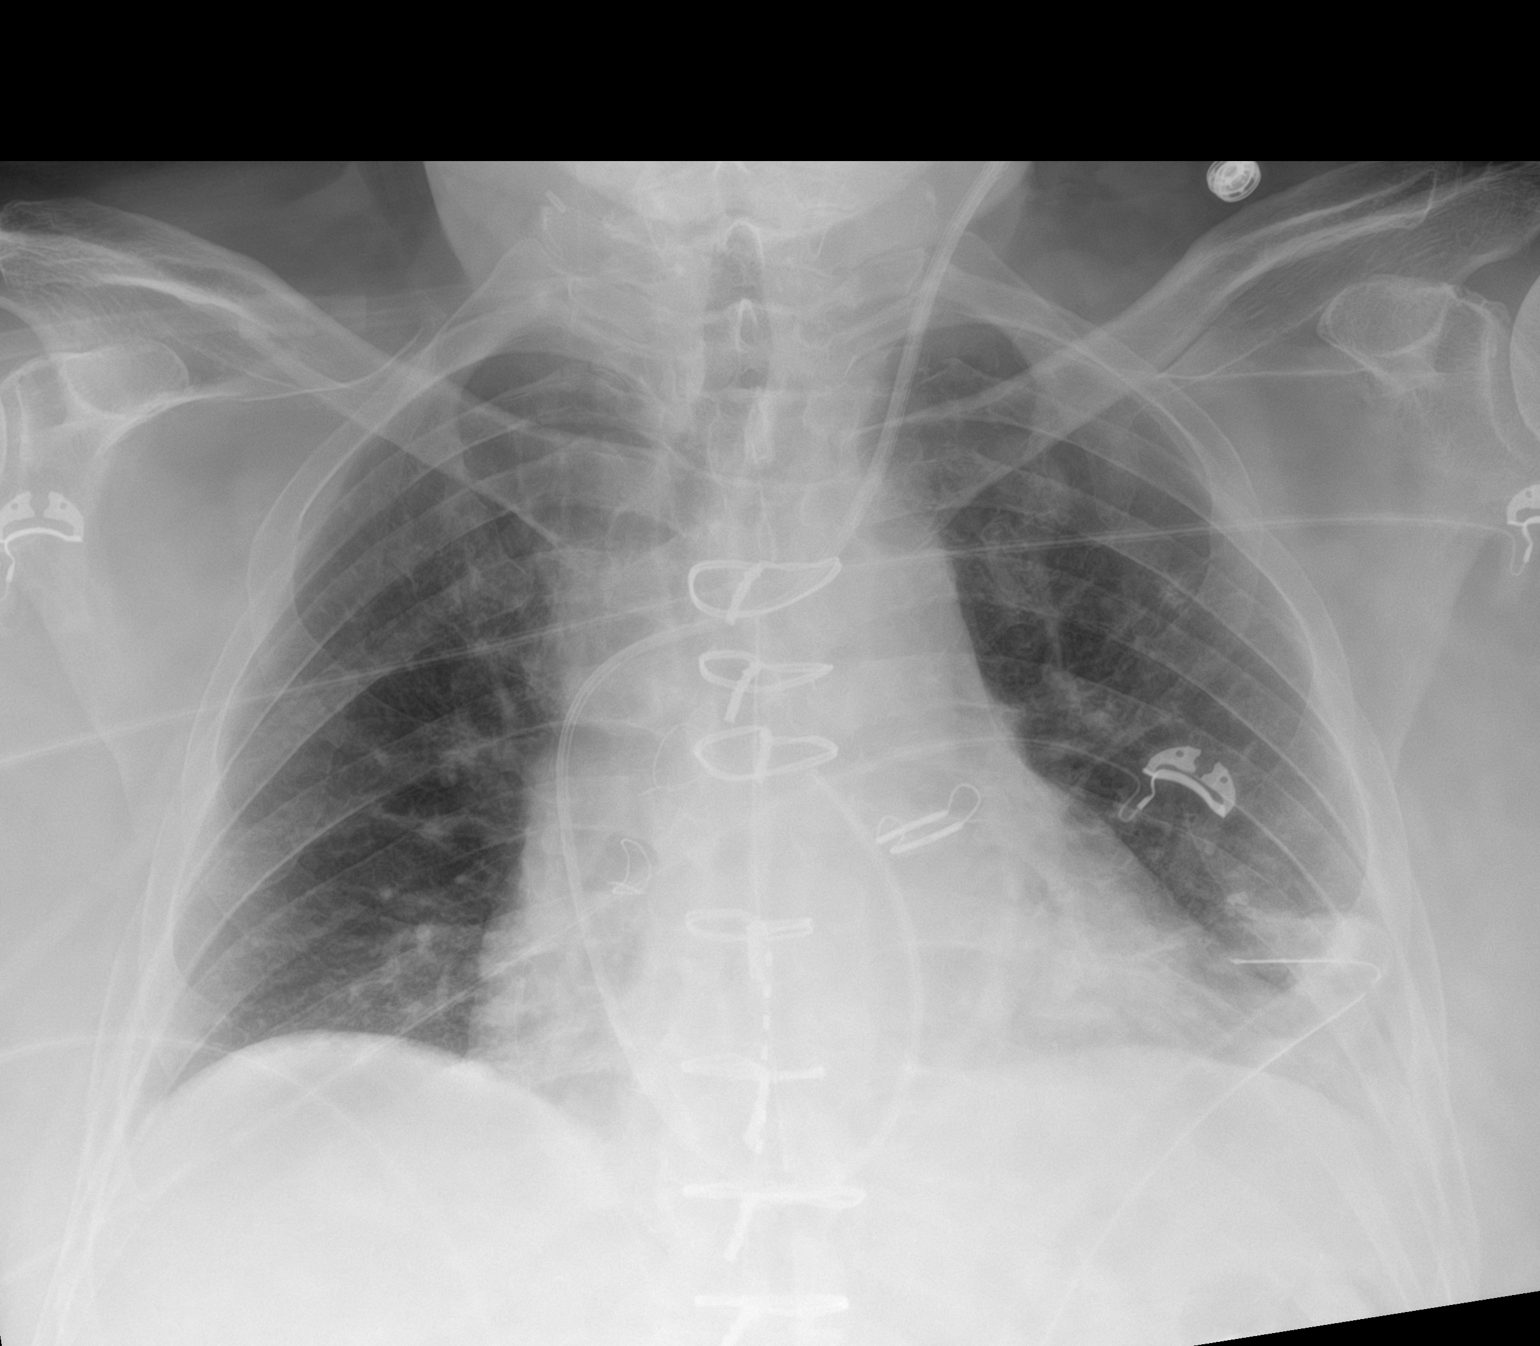

[1 of 1 positions shown; findings below may reference images not displayed]

FINDINGS: Interim extubation and removal of NG tube. Swan-Ganz catheter,
mediastinal drainage tube, and left chest tube in stable position.
No pneumothorax. Prior CABG. Left atrial appendage clip in stable
position. Cardiomegaly. No pulmonary venous congestion. Low lung
volumes with mild bibasilar atelectasis. No pleural effusion.
IMPRESSION: 1. Interim extubation removal of NG tube. Remaining lines and tubes
including left chest tube in stable position. No pneumothorax.

2. Prior CABG. Left atrial appendage clip in stable position. Stable
cardiomegaly.

3.  Low lung volumes with mild bibasilar atelectasis.

## 2022-02-15 IMAGING — DX DG CHEST 1V PORT
1 series · 1 of 1 positions shown · non-contrast
Comparison: 10/13/2020.

CLINICAL DATA: Sore chest.  Open-heart surgery.

EXAM:
PORTABLE CHEST 1 VIEW

[chest]
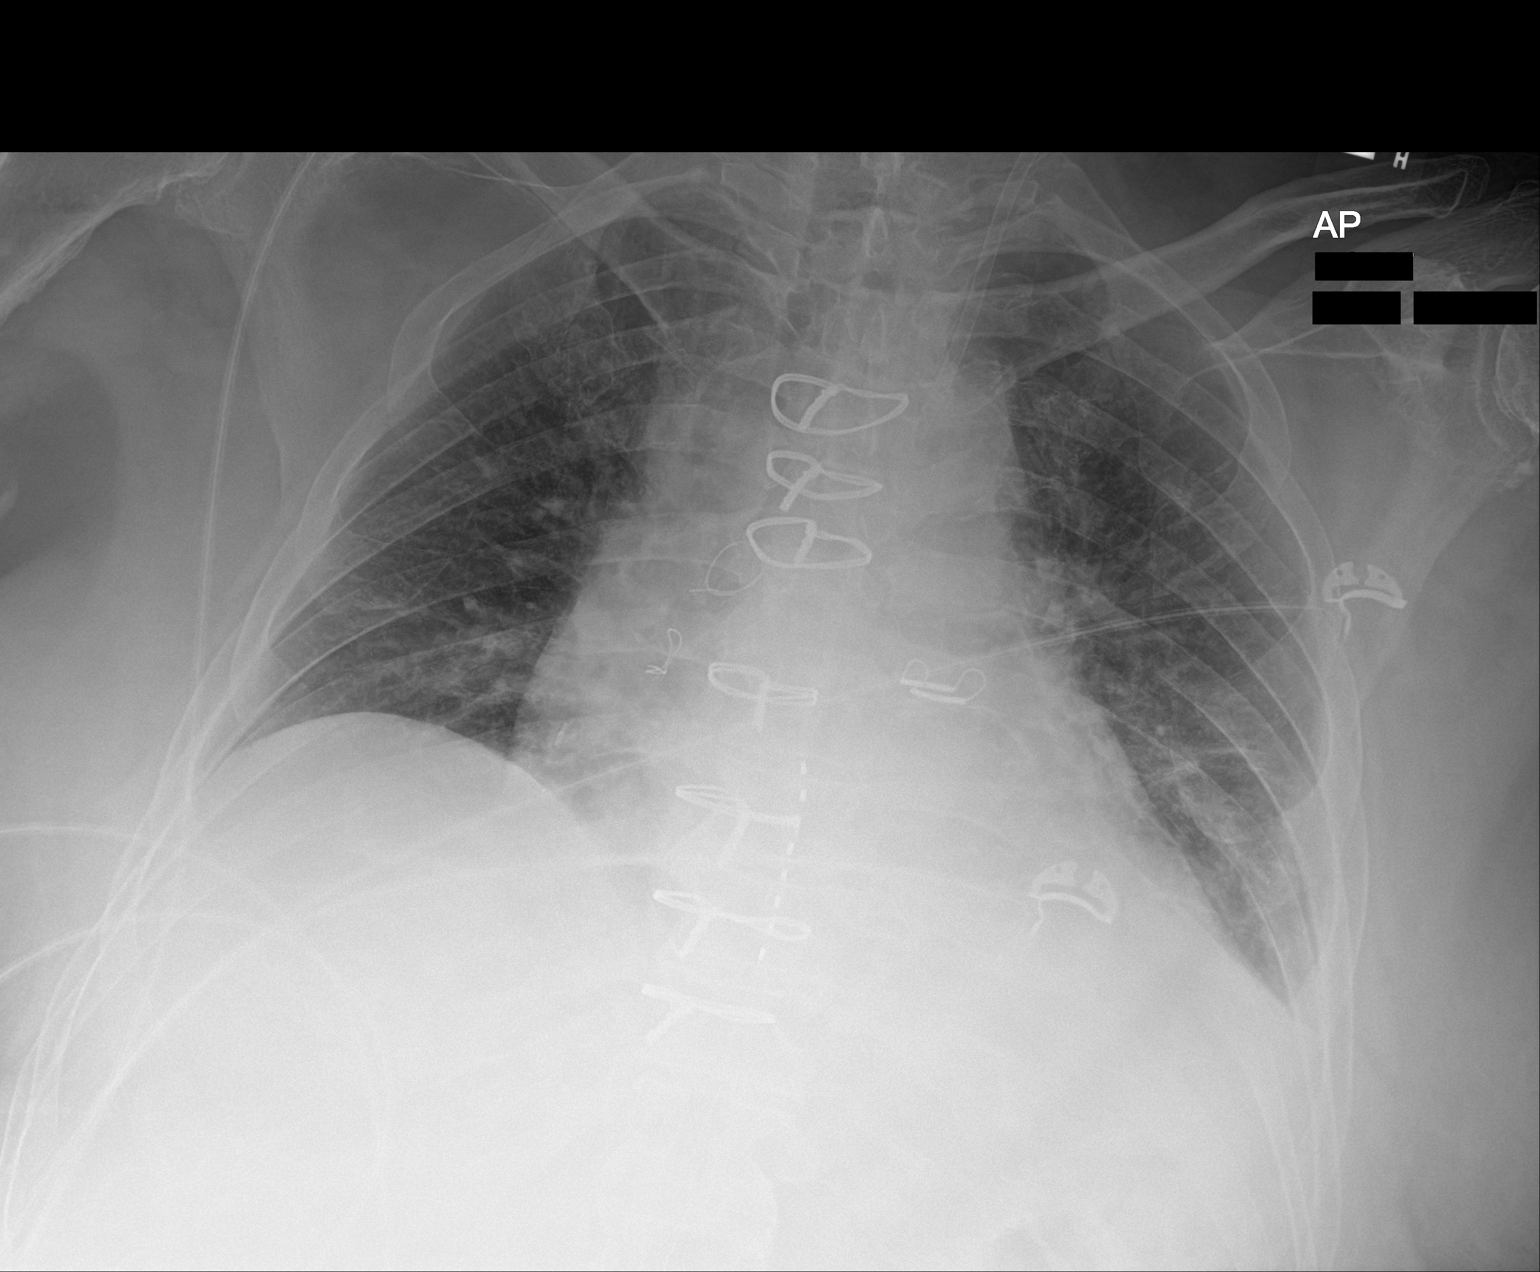

[1 of 1 positions shown; findings below may reference images not displayed]

FINDINGS: Interim removal of Swan-Ganz catheter, mediastinal drainage
catheter, left chest tube. Left IJ sheath in stable position. Prior
CABG. Left atrial appendage clip in stable position. Stable
cardiomegaly. Low lung volumes with mild bibasilar atelectasis again
noted. No prominent pleural effusion. No pneumothorax.
IMPRESSION: 1. Interim removal of Swan-Ganz catheter, mediastinal drainage
catheter, left chest tube. Left IJ sheath in stable position. No
pneumothorax.

2.  Prior CABG.  Stable cardiomegaly.

3.  Low lung volumes with mild bibasilar atelectasis again noted.

## 2022-02-18 DIAGNOSIS — E1121 Type 2 diabetes mellitus with diabetic nephropathy: Secondary | ICD-10-CM | POA: Diagnosis not present

## 2022-02-18 DIAGNOSIS — E785 Hyperlipidemia, unspecified: Secondary | ICD-10-CM | POA: Diagnosis not present

## 2022-02-18 IMAGING — CR DG CHEST 2V
2 series · 2 of 2 positions shown · non-contrast
Comparison: 10/14/2020

CLINICAL DATA: Post CABG

EXAM:
CHEST - 2 VIEW

[chest pa]
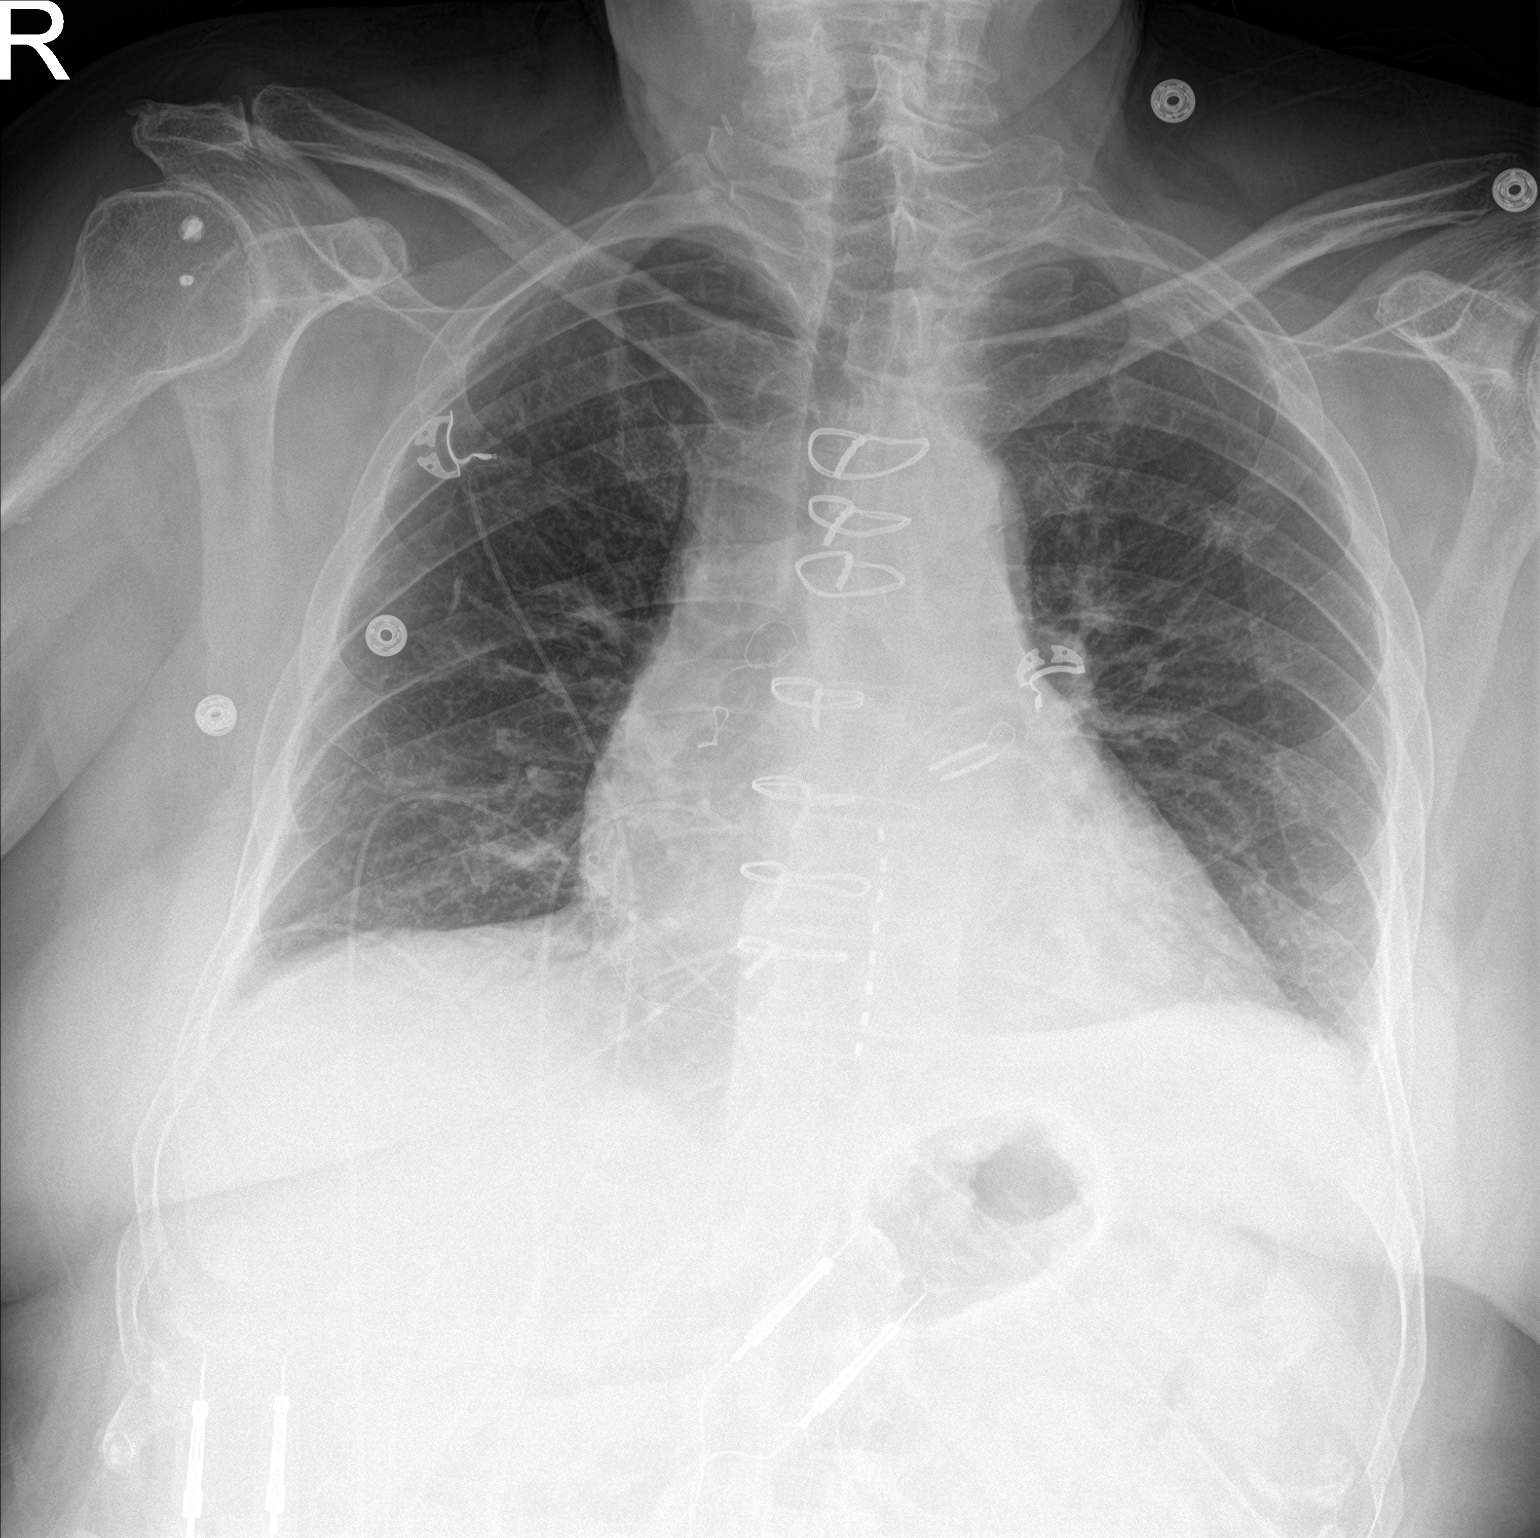

[chest lat]
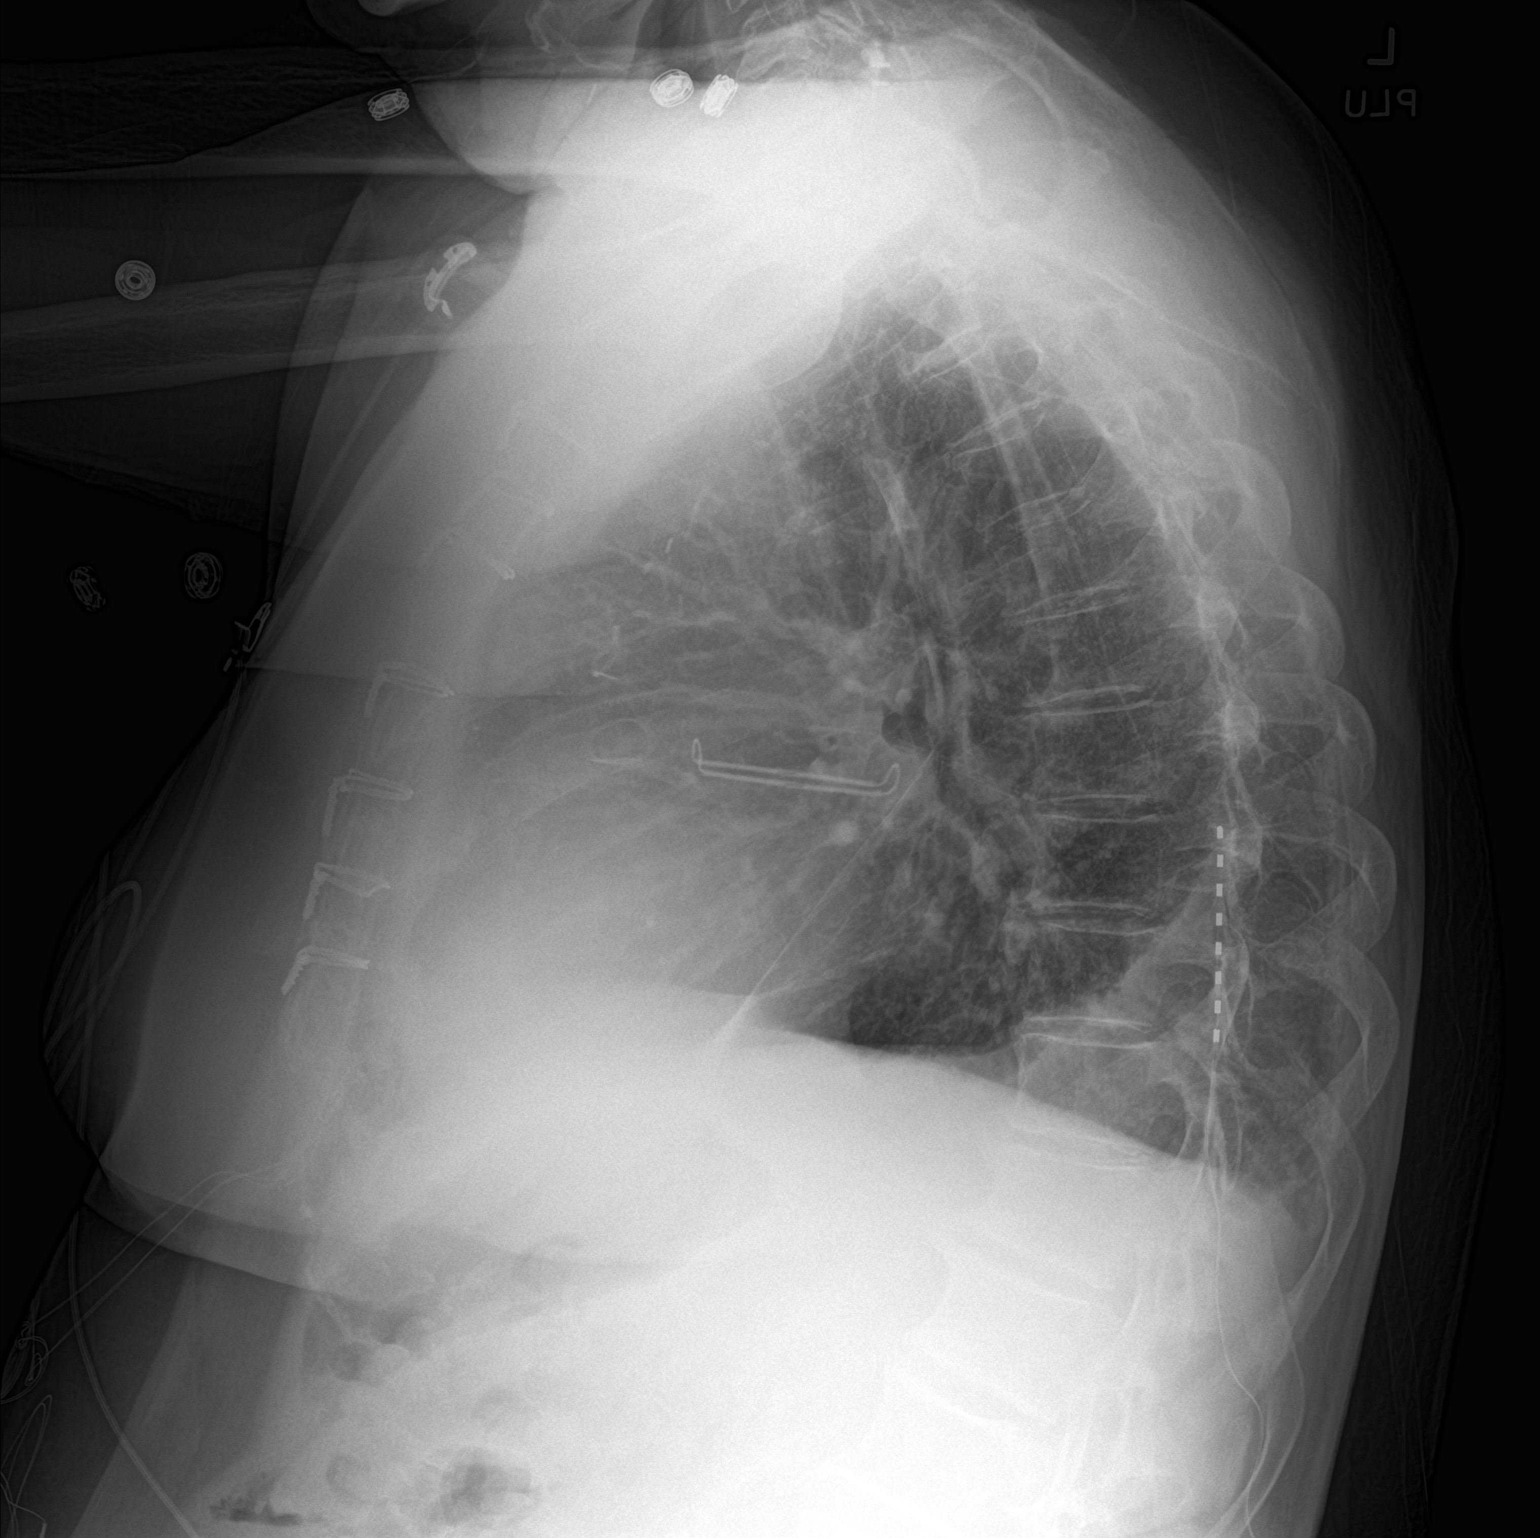

[2 of 2 positions shown; findings below may reference images not displayed]

FINDINGS: Improved aeration at the left lung base. Left IJ cannula has been
removed. No pneumothorax. Stable cardiomediastinal contours. Post
CABG. Atrial appendage clip. Thoracic spinal stimulator also noted.
IMPRESSION: Improved basilar atelectasis.  No new findings.

## 2022-02-25 DIAGNOSIS — M25562 Pain in left knee: Secondary | ICD-10-CM | POA: Diagnosis not present

## 2022-02-25 DIAGNOSIS — M238X2 Other internal derangements of left knee: Secondary | ICD-10-CM | POA: Diagnosis not present

## 2022-02-25 DIAGNOSIS — Z6830 Body mass index (BMI) 30.0-30.9, adult: Secondary | ICD-10-CM | POA: Diagnosis not present

## 2022-03-18 DIAGNOSIS — M228X2 Other disorders of patella, left knee: Secondary | ICD-10-CM | POA: Diagnosis not present

## 2022-03-18 DIAGNOSIS — Z6830 Body mass index (BMI) 30.0-30.9, adult: Secondary | ICD-10-CM | POA: Diagnosis not present

## 2022-03-18 DIAGNOSIS — M1712 Unilateral primary osteoarthritis, left knee: Secondary | ICD-10-CM | POA: Diagnosis not present

## 2022-03-20 DIAGNOSIS — E785 Hyperlipidemia, unspecified: Secondary | ICD-10-CM | POA: Diagnosis not present

## 2022-03-20 DIAGNOSIS — E1121 Type 2 diabetes mellitus with diabetic nephropathy: Secondary | ICD-10-CM | POA: Diagnosis not present
